# Patient Record
Sex: Female | Born: 1975 | Race: White | Hispanic: No | Smoking: Current every day smoker
Health system: Southern US, Community
[De-identification: ages and names within clinical notes are randomized; demographics above are authoritative.]

## PROBLEM LIST (undated history)

## (undated) DIAGNOSIS — T8859XA Other complications of anesthesia, initial encounter: Secondary | ICD-10-CM

## (undated) DIAGNOSIS — A159 Respiratory tuberculosis unspecified: Secondary | ICD-10-CM

## (undated) DIAGNOSIS — T4145XA Adverse effect of unspecified anesthetic, initial encounter: Secondary | ICD-10-CM

## (undated) DIAGNOSIS — Z5189 Encounter for other specified aftercare: Secondary | ICD-10-CM

## (undated) DIAGNOSIS — R569 Unspecified convulsions: Secondary | ICD-10-CM

## (undated) DIAGNOSIS — F419 Anxiety disorder, unspecified: Secondary | ICD-10-CM

## (undated) HISTORY — PX: OTHER SURGICAL HISTORY: SHX169

## (undated) HISTORY — PX: LEG SURGERY: SHX1003

---

## 2001-10-14 ENCOUNTER — Encounter (INDEPENDENT_AMBULATORY_CARE_PROVIDER_SITE_OTHER): Payer: Self-pay

## 2001-10-14 ENCOUNTER — Observation Stay (HOSPITAL_COMMUNITY): Admission: AD | Admit: 2001-10-14 | Discharge: 2001-10-15 | Payer: Self-pay | Admitting: *Deleted

## 2001-12-08 ENCOUNTER — Emergency Department (HOSPITAL_COMMUNITY): Admission: EM | Admit: 2001-12-08 | Discharge: 2001-12-08 | Payer: Self-pay | Admitting: Emergency Medicine

## 2003-12-04 ENCOUNTER — Emergency Department (HOSPITAL_COMMUNITY): Admission: EM | Admit: 2003-12-04 | Discharge: 2003-12-04 | Payer: Self-pay | Admitting: Emergency Medicine

## 2007-08-22 ENCOUNTER — Emergency Department (HOSPITAL_COMMUNITY): Admission: EM | Admit: 2007-08-22 | Discharge: 2007-08-22 | Payer: Self-pay | Admitting: *Deleted

## 2007-09-13 ENCOUNTER — Emergency Department (HOSPITAL_COMMUNITY): Admission: EM | Admit: 2007-09-13 | Discharge: 2007-09-13 | Payer: Self-pay | Admitting: Emergency Medicine

## 2008-04-16 ENCOUNTER — Emergency Department (HOSPITAL_COMMUNITY): Admission: EM | Admit: 2008-04-16 | Discharge: 2008-04-16 | Payer: Self-pay | Admitting: Emergency Medicine

## 2009-02-23 ENCOUNTER — Encounter (INDEPENDENT_AMBULATORY_CARE_PROVIDER_SITE_OTHER): Payer: Self-pay | Admitting: Cardiology

## 2009-02-23 ENCOUNTER — Ambulatory Visit (HOSPITAL_COMMUNITY): Admission: RE | Admit: 2009-02-23 | Discharge: 2009-02-23 | Payer: Self-pay | Admitting: Cardiology

## 2009-04-30 ENCOUNTER — Inpatient Hospital Stay (HOSPITAL_COMMUNITY): Admission: AD | Admit: 2009-04-30 | Discharge: 2009-05-01 | Payer: Self-pay | Admitting: Obstetrics and Gynecology

## 2009-10-29 HISTORY — PX: LIPOMA EXCISION: SHX5283

## 2010-04-12 ENCOUNTER — Ambulatory Visit (HOSPITAL_BASED_OUTPATIENT_CLINIC_OR_DEPARTMENT_OTHER): Admission: RE | Admit: 2010-04-12 | Discharge: 2010-04-12 | Payer: Self-pay | Admitting: General Surgery

## 2010-07-25 ENCOUNTER — Emergency Department (HOSPITAL_COMMUNITY): Admission: EM | Admit: 2010-07-25 | Discharge: 2010-07-25 | Payer: Self-pay | Admitting: Emergency Medicine

## 2010-10-29 HISTORY — PX: TUBAL LIGATION: SHX77

## 2010-12-14 LAB — HEPATITIS B SURFACE ANTIGEN: Hepatitis B Surface Ag: NEGATIVE

## 2010-12-14 LAB — ANTIBODY SCREEN: Antibody Screen: NEGATIVE

## 2010-12-14 LAB — ABO/RH: RH Type: POSITIVE

## 2010-12-14 LAB — RPR: RPR: NONREACTIVE

## 2011-01-14 LAB — POCT HEMOGLOBIN-HEMACUE: Hemoglobin: 14.1 g/dL (ref 12.0–15.0)

## 2011-01-15 LAB — CBC
HCT: 38.3 % (ref 36.0–46.0)
Hemoglobin: 13.4 g/dL (ref 12.0–15.0)
MCHC: 35 g/dL (ref 30.0–36.0)
MCV: 95.6 fL (ref 78.0–100.0)
Platelets: 226 10*3/uL (ref 150–400)
RDW: 12.6 % (ref 11.5–15.5)

## 2011-01-15 LAB — DIFFERENTIAL
Basophils Absolute: 0 10*3/uL (ref 0.0–0.1)
Eosinophils Absolute: 0.2 10*3/uL (ref 0.0–0.7)
Eosinophils Relative: 2 % (ref 0–5)
Lymphocytes Relative: 23 % (ref 12–46)
Monocytes Absolute: 0.4 10*3/uL (ref 0.1–1.0)

## 2011-01-15 LAB — PREGNANCY, URINE: Preg Test, Ur: NEGATIVE

## 2011-02-04 LAB — CBC
HCT: 34 % — ABNORMAL LOW (ref 36.0–46.0)
Hemoglobin: 11.9 g/dL — ABNORMAL LOW (ref 12.0–15.0)
MCHC: 35 g/dL (ref 30.0–36.0)
MCHC: 35.1 g/dL (ref 30.0–36.0)
MCV: 95.4 fL (ref 78.0–100.0)
Platelets: 274 10*3/uL (ref 150–400)
Platelets: 317 10*3/uL (ref 150–400)
RDW: 12.6 % (ref 11.5–15.5)
WBC: 13.8 10*3/uL — ABNORMAL HIGH (ref 4.0–10.5)

## 2011-02-05 ENCOUNTER — Inpatient Hospital Stay (HOSPITAL_COMMUNITY)
Admission: AD | Admit: 2011-02-05 | Discharge: 2011-02-05 | Disposition: A | Discharge status: Home / Self Care | Payer: Medicaid Other | Source: Ambulatory Visit | Attending: Obstetrics and Gynecology | Admitting: Obstetrics and Gynecology

## 2011-02-05 ENCOUNTER — Inpatient Hospital Stay (HOSPITAL_COMMUNITY): Payer: Medicaid Other

## 2011-02-05 DIAGNOSIS — O26879 Cervical shortening, unspecified trimester: Secondary | ICD-10-CM | POA: Insufficient documentation

## 2011-03-13 ENCOUNTER — Emergency Department (HOSPITAL_COMMUNITY)
Admission: EM | Admit: 2011-03-13 | Discharge: 2011-03-13 | Disposition: A | Discharge status: Home / Self Care | Payer: Medicaid Other | Attending: Emergency Medicine | Admitting: Emergency Medicine

## 2011-03-13 DIAGNOSIS — S2095XA Superficial foreign body of unspecified parts of thorax, initial encounter: Secondary | ICD-10-CM | POA: Insufficient documentation

## 2011-03-13 NOTE — H&P (Signed)
NAMEKAYE, LUOMA              ACCOUNT NO.:  [id number]   MEDICAL RECORD NO.:  [id number]          PATIENT TYPE:  INP   LOCATION:  9164                          FACILITY:  [facility]   PHYSICIAN:  Crist Fat. Rivard, M.D. DATE OF BIRTH:  1976/10/07   DATE OF ADMISSION:  04/30/2009  DATE OF DISCHARGE:                              HISTORY & PHYSICAL   Ms. Dukes is a 35 year old, gravida 5, para 2-0-1-2 at 39 weeks who  presented complaining of spontaneous rupture of membranes at  approximately 5:30 a.m. with clear fluid noted and uterine contractions  every 2-4 minutes.   The patient's pregnancy is remarkable for:  1. Group B strep negative.  2. PENICILLIN allergy.  3. History of cryo in 2000.  4. History of positive PPD but negative chest x-ray in October 2009.  5. History of right atrial enlargement but a negative cardiac workup      during the pregnancy.  6. The patient is a redhead.   PRENATAL LABORATORY DATA:  Blood type is O+, Rh antibody negative, VDRL  nonreactive, rubella titer positive, hepatitis B surface antigen  negative, HIV nonreactive.  GC and Chlamydia cultures were negative.  Pap was normal.  Hemoglobin upon entering the practice was 13.  It was  within normal limits at 28 weeks.  The patient had first trimester  screen that was normal.  I do not see AFP documented on her chart.  She  had a normal Glucola.  She had a negative group B strep, chlamydia and  GC at 35 weeks.   HISTORY OF PRESENT PREGNANCY:  The patient entered care at 13 weeks.  She did have first trimester screen that was normal.  She had been  evaluated at [facility] or [facility] for some chest pain in the  past.  She was referred to her cardiologist for this history secondary  to questionable right atrial enlargement.  She did have some type of  questionable ganglion versus a cyst on her right thigh.  She was  referred to general surgeon for that evaluation.  She had some headaches  at 19 weeks  for which she was recommend to use ibuprofen.  Her EDC of  May 07, 2009, was established by LMP and was in agreement with  ultrasound at first trimester screening.  She had another ultrasound at  19 weeks showing normal growth, anterior placenta, normal cervical  length and normal fluid and anatomy.  The general surgery consult  regarding her lump in the thigh was diagnostic for a lipoma.  Plan was  made to remove after delivery.  She missed her initial cardiology  appointment.  She was advised this was necessary.  She was seen by Dr.  Anne Fu on April 14.  She questions whether she may have had some  pericarditis but did not require any issues.  She then had a fairly  normal echocardiogram.  At that time, she was still smoking a little  bit, but she has subsequently stopped.  She had a normal Glucola.  She  originally had thought that she might want a tubal  but then declined  this as time went along.  At 36 weeks, her Group B strep and GC and  Chlamydia cultures were negative.  At 37 weeks, her cervix was 3, 50%  vertex, -2.  The rest of her pregnancy was essentially uncomplicated.   OBSTETRICAL HISTORY:  In 1997, she had a vaginal birth of a female  infant that weighed 8 pounds 10 ounces at 40 weeks.  She was in labor 12  hours.  She had epidural anesthesia that child was born in [place].  In 1999, she had a vaginal birth of a female infant that weighed 6  pounds 15 ounces at 40 weeks.  She was in labor 8 hours.  She had  epidural anesthesia, no complications.  That child was also born at [facility].  In 2001, she had a first trimester TAB.  In April 2009, she had  a first trimester miscarriage that required a dilatation and curettage.  She has had an abnormal Pap in the past and had cryosurgery in 2000.  She had a history of right atrial enlargement and was seen in the ER in  March 2009.  She had a negative cardiac workup.  She had a positive PPD.  In the past, she had a negative  chest x-ray in 2009.  She had some UTIs  during her previous pregnancies.  She was a previous smoker.  Her only  other hospitalizations were for childbirth and at 6 months, she was in  the hospital for Reye syndrome.   ALLERGIES:  The patient allergic to PENICILLIN which causes hives and  difficulty breathing.   FAMILY HISTORY:  Maternal grandfather had heart disease.  Paternal  grandfather had hypertension.  Mother and maternal grandmother have  varicosities.  Her daughter has asthma.  Paternal grandmother had  lymphocytic lymphoma.  Her mother and maternal grandmother are  schizophrenic. Her mother and other multiple family members are smokers.  Her mother and brother are drug and alcohol users.   SURGICAL HISTORY:  Includes cryosurgery in 2000, TAB in 2001, dilatation  and curettage in 2009.   SOCIAL HISTORY:  The patient is currently legally separated.  The father  of baby is involved and supportive; his name is Evangeline Dakin.  The  patient is currently in nursing school.  Her partner is employed in  [employer]; he has a Engineer, mining.  She has been followed by the  certified nurse midwife service at [facility].  She denies any  drug or alcohol use during this pregnancy.  She was a smoker in the  early part of pregnancy but has subsequently stopped.   IMPRESSION:  1. Intrauterine pregnancy at 39 weeks.  2. Spontaneous rupture of membranes in early labor.  3. Group B strep negative.   PLAN:  1. Admit to birthing suite for consult with Dr. Estanislado Pandy as attending      physician.  2. Routine certified nurse midwife orders.  3. Plan epidural then augmentation with Pitocin p.r.n.      Renaldo Reel Emilee Hero, C.N.M.      Crist Fat Rivard, M.D.  Electronically Signed    VLL/MEDQ  D:  04/30/2009  T:  04/30/2009  Job:  [id number]

## 2011-03-16 NOTE — Discharge Summary (Signed)
[facility]  Patient:    Miranda Wood, Miranda Wood Visit Number: [id number] MRN: [id number]          Service Type: DSU Location: 9300 [id number] Attending Physician:  Michaelle Copas Dictated by:   Arlis Porta, M.D. Admit Date:  10/14/2001 Discharge Date: 10/15/2001                             Discharge Summary  DISCHARGE DIAGNOSIS:          Status post dilation and evacuation for retained                               products from previous therapeutic abortion on                               September 19, 2001.  LABORATORY DATA:              CBC on October 14, 2001 showed a white blood cell count of 6.4, hemoglobin of 11.5, hematocrit of 33.1, platelet count of 258.  The patient is O positive blood type.  HOSPITAL COURSE:              The patient is a 35 year old white female, G3, P2-0-1-2 who presented with heavy vaginal bleeding about two hours prior to arrival.  Again, she is status post D&E on September 19, 2001.  Since then she has reported passing fist-size clots and pouring blood while sitting on the toilet.  This has made her feel weak and faint.  Of note, the patient is taking Yasmin birth control pills.                                On physical exam, the patient had a mild temperature of 100.4 with a blood pressure of 133/84 and a pulse of 87.  Her physical exam was nonfocal, specifically good breath sounds and normal heart sounds.  The patients cervix was noted to be with a closed os but with continuous passage of clots.  The uterus was noted to be slightly enlarged with fullness bilaterally.  The patients CBC was normal as above.                                The patient was then sent to the OR for repeat D&E for possibly retained products.  The patient tolerated the procedure well with an estimated blood loss of less than 100 cc of blood.  She was admitted for 23-hour observation overnight for which she remained well.  The  patient denied any further bleeding.  Her vital signs became afebrile and normal postoperatively.  She was treated empirically with cefotetan IV q.12h.  She was then discharged with a single dose of 500 mg Cipro as well as 1 g of azithromycin for GC and Chlamydia.  She was discharged with Methergine 0.2 mg p.o. q.6h. for two days as well.  Social worker was sent to the patients room for discussion of followup arrangements.  The patient was then discharged to home in good condition.  DISCHARGE MEDICATION:         Methergine 0.2 mg p.o. q.6h. x 2 days for bleeding.  ACTIVITY:  No restrictions.  DIET:                         No restrictions.  WOUND CARE:                   Watch for bleeding, increased pain or burning, and fever.  DISCHARGE INSTRUCTIONS:       She is to return to the hospital for any of the above symptoms.  FOLLOWUP:                     This will be arranged with the social worker at discharge.  If she has any questions, she is to feel free to come back to [facility]. Dictated by:   Arlis Porta, M.D. Attending Physician:  Michaelle Copas DD:  10/15/01 TD:  10/15/01 Job: [id number]

## 2011-03-16 NOTE — Op Note (Signed)
[facility]  Patient:    LORALI, KHAMIS Visit Number: [id number] MRN: [id number]          Service Type: DSU Location: 9300 [id number] Attending Physician:  Michaelle Copas Dictated by:   Conni Elliot, M.D. Proc. Date: 10/14/01 Admit Date:  10/14/2001                             Operative Report  PREOPERATIVE DIAGNOSIS:       Hemorrhage probably secondary to retained products of conception.  POSTOPERATIVE DIAGNOSIS:      Hemorrhage probably secondary to retained products of conception.  OPERATION:                    Dilatation and evacuation.  SURGEON:                      Conni Elliot, M.D.  ANESTHESIA:                   MAC.  FINDINGS:                     Uterus was upper limits of normal size, anterior.  The cervix was already dilated and easily accepted with gradual progression but really minimal dilatation up to a #30 dilator. A #10 suction curet was utilized.  DESCRIPTION OF PROCEDURE:     With patient under MAC anesthesia, the patient supine in the lithotomy position, perineum and vagina were prepped with Betadine solution.  Bladder was emptied and weighted speculum was placed in the posterior vagina.  Anterior cervix was grasped.  As stated, the cervix was already dilated to easily accept all the way up to #30 dilator.  A #10 suction curet was utilized.  The completeness of the procedure was identified by sharp curettage.  Estimated blood loss was less than 100 cc.  There was apparent significant amount of either retained products or possibly a blood clot, but appeared to be retained products. Dictated by:   Conni Elliot, M.D. Attending Physician:  Michaelle Copas DD:  10/14/01 TD:  10/15/01 Job: [id number]

## 2011-05-28 ENCOUNTER — Encounter (HOSPITAL_COMMUNITY): Payer: Self-pay | Admitting: *Deleted

## 2011-05-28 ENCOUNTER — Inpatient Hospital Stay (HOSPITAL_COMMUNITY)
Admission: AD | Admit: 2011-05-28 | Discharge: 2011-05-28 | Disposition: A | Discharge status: Home / Self Care | Payer: Medicaid Other | Source: Ambulatory Visit | Attending: Obstetrics and Gynecology | Admitting: Obstetrics and Gynecology

## 2011-05-28 DIAGNOSIS — R350 Frequency of micturition: Secondary | ICD-10-CM | POA: Insufficient documentation

## 2011-05-28 DIAGNOSIS — O99330 Smoking (tobacco) complicating pregnancy, unspecified trimester: Secondary | ICD-10-CM | POA: Insufficient documentation

## 2011-05-28 DIAGNOSIS — O30039 Twin pregnancy, monochorionic/diamniotic, unspecified trimester: Secondary | ICD-10-CM | POA: Insufficient documentation

## 2011-05-28 DIAGNOSIS — N949 Unspecified condition associated with female genital organs and menstrual cycle: Secondary | ICD-10-CM | POA: Insufficient documentation

## 2011-05-28 DIAGNOSIS — O3430 Maternal care for cervical incompetence, unspecified trimester: Secondary | ICD-10-CM | POA: Insufficient documentation

## 2011-05-28 DIAGNOSIS — O9989 Other specified diseases and conditions complicating pregnancy, childbirth and the puerperium: Secondary | ICD-10-CM | POA: Insufficient documentation

## 2011-05-28 DIAGNOSIS — O30009 Twin pregnancy, unspecified number of placenta and unspecified number of amniotic sacs, unspecified trimester: Secondary | ICD-10-CM | POA: Insufficient documentation

## 2011-05-28 LAB — URINALYSIS, ROUTINE W REFLEX MICROSCOPIC
Bilirubin Urine: NEGATIVE
Glucose, UA: NEGATIVE mg/dL
Hgb urine dipstick: NEGATIVE
Specific Gravity, Urine: 1.015 (ref 1.005–1.030)

## 2011-05-28 MED ORDER — ACETAMINOPHEN 325 MG PO TABS
650.0000 mg | ORAL_TABLET | Freq: Four times a day (QID) | ORAL | Status: DC | PRN
  Administered 2011-05-28: 650 mg via ORAL

## 2011-05-28 MED ORDER — BETAMETHASONE SOD PHOS & ACET 6 (3-3) MG/ML IJ SUSP
12.5000 mg | Freq: Once | INTRAMUSCULAR | Status: AC
  Administered 2011-05-28: 12.5 mg via INTRAMUSCULAR
  Filled 2011-05-28: qty 2.1

## 2011-05-28 NOTE — Progress Notes (Signed)
H STEELMAN CNM AT BEDSIDE  

## 2011-05-28 NOTE — Progress Notes (Signed)
WATER PITCHER, SPRITE AND GRAHAM CRACKERS OFFERED. [place] SANDWICH ORDERED.

## 2011-05-28 NOTE — Progress Notes (Signed)
D/C CARDIO PER H STEELMAN CNM 2ND REVIEWER OF STRIP

## 2011-05-28 NOTE — ED Provider Notes (Signed)
History   Pt is 35y.o. WF [id number] at 32.4 weeks who was sent from office for PTL eval secondary to c/o increasing pelvic pressure recently & cx 2cm/50% at office per Dr. Normand Sloop. No FFN or cx's done at office.  Pt reports good fetal movement x2.  At office for routine visit & had scheduled u/s & both fetuses weighing around 4.5 lbs.  A-Vertex. B-Breech. Pt not working, but s.o. States in MAU that she is still very active and "hard to get her to sit down."  Denies VB, LOF, abnl d/c.  No FFN done in this pregnancy per pt recall. Pt hasn't been to hospital since around 16 weeks when came for u/s for cx length.   Pregnancy r/f:  1.  Twins:  Mono/di   2.  Cryo 2000   3.  PCN allergy--breathing difficulty   4.  Rt atrial enlargement   5.  Unk LMP   6.  Previously seen LVEIF seen on one fetus this preg & unsure if persists..today's u/s not available  Chief Complaint  Patient presents with  . Laboring   HPI  OB History    Grav Para Term Preterm Abortions TAB SAB Ect Mult Living   6 3 3  2  2   3       No past medical history on file.  Past Surgical History  Procedure Date  . Leg surgery     No family history on file.  History  Substance Use Topics  . Smoking status: Current Everyday Smoker -- 1.0 packs/day  . Smokeless tobacco: Not on file  . Alcohol Use: No    Allergies:  Allergies  Allergen Reactions  . Penicillins     Prescriptions prior to admission  Medication Sig Dispense Refill  . acetaminophen (TYLENOL) 500 MG tablet Take 1,000 mg by mouth every 6 (six) hours as needed.        . prenatal vitamin w/FE, FA (PRENATAL 1 + 1) 27-1 MG TABS Take 1 tablet by mouth daily.          Review of Systems  Constitutional: Negative.   Respiratory: Negative.   Gastrointestinal: Negative.   Genitourinary: Positive for frequency.  Neurological: Negative.  Negative for dizziness and headaches.   Physical Exam   Blood pressure 121/73, pulse 96, temperature 98.6 F (37 C),  temperature source Oral, resp. rate 20, height 5\' 8"  (1.727 m), weight 87.998 kg (194 lb).  Physical Exam  Constitutional: She is oriented to person, place, and time. She appears well-developed and well-nourished.  Cardiovascular: Normal rate and regular rhythm.   Respiratory: Effort normal.  GI: Soft.       gravid  Genitourinary:       Cx:  2.5/50/-2, anterior, medium consistency. Spec exam deferred.  Deferred GBS, Gc/Ct & wet prep as well  Neurological: She is alert and oriented to person, place, and time.  Skin: Skin is warm and dry.  Psychiatric: She has a normal mood and affect. Her behavior is normal. Judgment and thought content normal.   Labs:  U/a WNL--SG =1.015  EFM:  "A"--140, reactive, moderate variability, no decels  "B"--150, reactive, moderate variability, no decels TOCO:  occ'l ctx w/ intermittent UI  MAU Course  Procedures 1.  U/a 2.  3 hrs ext monitoring 3.  Recheck of cx exam after monitoring for labor 4.  Urine sent for culture, despite u/a WNL 5.  1st dose of Betamethasone IM at 1315  Assessment and Plan  1.  Twins  at 32.4 weeks 2.  Preterm cervical dilatation 3.  No further cervical change after 3 hrs of monitoring 4.  1st dose BMZ at 1315 5.  H/o cryo & multip  1.  Per c/w dr. Stefano Gaul, d/c home on Level 3 bedrest and PTL precautions.  Pt to return to MAU for 2nd dose of BMZ tomorrow 05/29/11.  Injection due at 1315, but instructed pt to arrive around 1245. 2.  Continue tylenol & benadryl prn pain, insomnia. 3.  Pelvic rest. 4.  Also has scheduled f/u at [facility] 06/04/11.  F/u prn 5.  FFN & cx's prn.  Cannot perform FFN until after 1500 05/29/11 is needed.   Ronell Duffus H 05/28/2011, 3:23 PM

## 2011-05-28 NOTE — Progress Notes (Signed)
H STEELMAN CNM AT BEDSIDE

## 2011-05-28 NOTE — Progress Notes (Signed)
Sent from office, 2 cm dilated.  Feeling pressure, unsure of contractions

## 2011-05-29 ENCOUNTER — Inpatient Hospital Stay (HOSPITAL_COMMUNITY)
Admission: AD | Admit: 2011-05-29 | Discharge: 2011-05-29 | Disposition: A | Discharge status: Home / Self Care | Payer: Medicaid Other | Source: Ambulatory Visit | Attending: Obstetrics and Gynecology | Admitting: Obstetrics and Gynecology

## 2011-05-29 DIAGNOSIS — O4700 False labor before 37 completed weeks of gestation, unspecified trimester: Secondary | ICD-10-CM | POA: Insufficient documentation

## 2011-05-29 LAB — URINE CULTURE

## 2011-05-29 MED ORDER — BETAMETHASONE SOD PHOS & ACET 6 (3-3) MG/ML IJ SUSP
12.0000 mg | Freq: Once | INTRAMUSCULAR | Status: AC
  Administered 2011-05-29: 12 mg via INTRAMUSCULAR
  Filled 2011-05-29: qty 2

## 2011-06-01 NOTE — ED Provider Notes (Signed)
Pt presents to MAU 05/29/11 for Second Betamethasone injection for fetal pulmonary preparation in case of preterm delivery in the setting of preterm cervical dilation. See ED Provider note from 05/28/11 by Larna Daughters for details. Miranda Forth, MD

## 2011-06-06 ENCOUNTER — Other Ambulatory Visit: Payer: Self-pay | Admitting: Obstetrics and Gynecology

## 2011-07-04 ENCOUNTER — Encounter (HOSPITAL_COMMUNITY): Payer: Self-pay

## 2011-07-04 ENCOUNTER — Encounter (HOSPITAL_COMMUNITY)
Admission: RE | Admit: 2011-07-04 | Discharge: 2011-07-04 | Disposition: A | Discharge status: Home / Self Care | Payer: Medicaid Other | Source: Ambulatory Visit | Attending: Obstetrics and Gynecology | Admitting: Obstetrics and Gynecology

## 2011-07-04 HISTORY — DX: Adverse effect of unspecified anesthetic, initial encounter: T41.45XA

## 2011-07-04 HISTORY — DX: Other complications of anesthesia, initial encounter: T88.59XA

## 2011-07-04 LAB — CBC
MCH: 32.7 pg (ref 26.0–34.0)
MCV: 94.1 fL (ref 78.0–100.0)
Platelets: 207 10*3/uL (ref 150–400)
RDW: 14 % (ref 11.5–15.5)
WBC: 10.9 10*3/uL — ABNORMAL HIGH (ref 4.0–10.5)

## 2011-07-04 NOTE — Patient Instructions (Addendum)
20 Versie Soave Dukes  07/04/2011   Your procedure is scheduled on:  07/09/11  Enter through the Main Entrance of [facility] at 9 AM.  Pick up the phone at the desk and dial [phone].   Call this number if you have problems the morning of surgery: [phone]   Remember:   Do not eat food:After Midnight.  Do not drink clear liquids: After Midnight.  Take these medicines the morning of surgery with A SIP OF WATER: NA   Do not wear jewelry, make-up or nail polish.  Do not wear lotions, powders, or perfumes. You may wear deodorant.  Do not shave 48 hours prior to surgery.  Do not bring valuables to the hospital.  Contacts, dentures or bridgework may not be worn into surgery.  Leave suitcase in the car. After surgery it may be brought to your room.  For patients admitted to the hospital, checkout time is 11:00 AM the day of discharge.   Patients discharged the day of surgery will not be allowed to drive home.  Name and phone number of your driver: NA  Special Instructions: CHG Shower Use Special Wash: 1/2 bottle night before surgery and 1/2 bottle morning of surgery.   Please read over the following fact sheets that you were given: MRSA Information

## 2011-07-08 MED ORDER — CLINDAMYCIN PHOSPHATE 900 MG/50ML IV SOLN
900.0000 mg | INTRAVENOUS | Status: DC
  Filled 2011-07-08: qty 50

## 2011-07-08 MED ORDER — GENTAMICIN IN SALINE 1-0.9 MG/ML-% IV SOLN
100.0000 mg | INTRAVENOUS | Status: DC
  Filled 2011-07-08: qty 100

## 2011-07-09 ENCOUNTER — Encounter (HOSPITAL_COMMUNITY)
Admission: RE | Disposition: A | Discharge status: Home / Self Care | Payer: Self-pay | Source: Ambulatory Visit | Attending: Obstetrics and Gynecology

## 2011-07-09 ENCOUNTER — Other Ambulatory Visit: Payer: Self-pay | Admitting: Obstetrics and Gynecology

## 2011-07-09 ENCOUNTER — Encounter (HOSPITAL_COMMUNITY): Payer: Self-pay | Admitting: *Deleted

## 2011-07-09 ENCOUNTER — Inpatient Hospital Stay (HOSPITAL_COMMUNITY): Payer: Medicaid Other | Admitting: Anesthesiology

## 2011-07-09 ENCOUNTER — Encounter (HOSPITAL_COMMUNITY): Payer: Self-pay | Admitting: Anesthesiology

## 2011-07-09 ENCOUNTER — Inpatient Hospital Stay (HOSPITAL_COMMUNITY)
Admission: RE | Admit: 2011-07-09 | Discharge: 2011-07-12 | DRG: 766 | Disposition: A | Discharge status: Home / Self Care | Payer: Medicaid Other | Source: Ambulatory Visit | Attending: Obstetrics and Gynecology | Admitting: Obstetrics and Gynecology

## 2011-07-09 DIAGNOSIS — O30039 Twin pregnancy, monochorionic/diamniotic, unspecified trimester: Secondary | ICD-10-CM

## 2011-07-09 DIAGNOSIS — Z302 Encounter for sterilization: Secondary | ICD-10-CM

## 2011-07-09 DIAGNOSIS — O30009 Twin pregnancy, unspecified number of placenta and unspecified number of amniotic sacs, unspecified trimester: Secondary | ICD-10-CM | POA: Diagnosis present

## 2011-07-09 DIAGNOSIS — O3090 Multiple gestation, unspecified, unspecified trimester: Principal | ICD-10-CM | POA: Diagnosis present

## 2011-07-09 SURGERY — Surgical Case
Anesthesia: Spinal | Laterality: Bilateral | Wound class: Clean Contaminated

## 2011-07-09 MED ORDER — WITCH HAZEL-GLYCERIN EX PADS: 1.0000 "application " | MEDICATED_PAD | CUTANEOUS | Status: DC | PRN

## 2011-07-09 MED ORDER — ONDANSETRON HCL 4 MG PO TABS: 4.0000 mg | ORAL_TABLET | ORAL | Status: DC | PRN

## 2011-07-09 MED ORDER — KETOROLAC TROMETHAMINE 60 MG/2ML IM SOLN
60.0000 mg | Freq: Once | INTRAMUSCULAR | Status: AC | PRN
  Administered 2011-07-09: 60 mg via INTRAMUSCULAR

## 2011-07-09 MED ORDER — FENTANYL CITRATE 0.05 MG/ML IJ SOLN
INTRAMUSCULAR | Status: DC | PRN
  Administered 2011-07-09: 15 ug via INTRATHECAL
  Administered 2011-07-09: 25 ug via INTRAVENOUS

## 2011-07-09 MED ORDER — OXYTOCIN 20 UNITS IN LACTATED RINGERS INFUSION - SIMPLE
125.0000 mL/h | INTRAVENOUS | Status: DC
  Administered 2011-07-09: 125 mL/h via INTRAVENOUS

## 2011-07-09 MED ORDER — DIPHENHYDRAMINE HCL 50 MG/ML IJ SOLN: 25.0000 mg | INTRAMUSCULAR | Status: DC | PRN

## 2011-07-09 MED ORDER — LACTATED RINGERS IV SOLN
Freq: Once | INTRAVENOUS | Status: AC
  Administered 2011-07-09: 10:00:00 via INTRAVENOUS

## 2011-07-09 MED ORDER — METHYLERGONOVINE MALEATE 0.2 MG/ML IJ SOLN
0.2000 mg | Freq: Once | INTRAMUSCULAR | Status: AC
  Administered 2011-07-09: 0.2 mg via INTRAMUSCULAR
  Filled 2011-07-09: qty 1

## 2011-07-09 MED ORDER — SODIUM CHLORIDE 0.9 % IJ SOLN: 3.0000 mL | INTRAMUSCULAR | Status: DC | PRN

## 2011-07-09 MED ORDER — SERTRALINE HCL 25 MG PO TABS
25.0000 mg | ORAL_TABLET | Freq: Every day | ORAL | Status: DC
  Administered 2011-07-10: 11:00:00 via ORAL
  Administered 2011-07-11 – 2011-07-12 (×2): 25 mg via ORAL
  Filled 2011-07-09 (×4): qty 1

## 2011-07-09 MED ORDER — MEASLES, MUMPS & RUBELLA VAC ~~LOC~~ INJ: 0.5000 mL | INJECTION | SUBCUTANEOUS | Status: DC | PRN

## 2011-07-09 MED ORDER — IBUPROFEN 600 MG PO TABS
600.0000 mg | ORAL_TABLET | Freq: Four times a day (QID) | ORAL | Status: DC
  Administered 2011-07-10 – 2011-07-12 (×10): 600 mg via ORAL
  Filled 2011-07-09 (×4): qty 1

## 2011-07-09 MED ORDER — TETANUS-DIPHTH-ACELL PERTUSSIS 5-2.5-18.5 LF-MCG/0.5 IM SUSP
0.5000 mL | Freq: Once | INTRAMUSCULAR | Status: AC
  Administered 2011-07-10: 0.5 mL via INTRAMUSCULAR
  Filled 2011-07-09: qty 0.5

## 2011-07-09 MED ORDER — ZOLPIDEM TARTRATE 5 MG PO TABS: 5.0000 mg | ORAL_TABLET | Freq: Every evening | ORAL | Status: DC | PRN

## 2011-07-09 MED ORDER — ONDANSETRON HCL 4 MG/2ML IJ SOLN
INTRAMUSCULAR | Status: AC
  Filled 2011-07-09: qty 2

## 2011-07-09 MED ORDER — OXYCODONE-ACETAMINOPHEN 5-325 MG PO TABS
1.0000 | ORAL_TABLET | ORAL | Status: DC | PRN
  Administered 2011-07-09: 1 via ORAL
  Administered 2011-07-10: 2 via ORAL
  Administered 2011-07-10: 1 via ORAL
  Administered 2011-07-10 (×2): 2 via ORAL
  Administered 2011-07-10 – 2011-07-11 (×2): 1 via ORAL
  Administered 2011-07-11 – 2011-07-12 (×4): 2 via ORAL
  Filled 2011-07-09 (×2): qty 2
  Filled 2011-07-09 (×2): qty 1
  Filled 2011-07-09: qty 2
  Filled 2011-07-09 (×2): qty 1
  Filled 2011-07-09 (×3): qty 2
  Filled 2011-07-09 (×2): qty 1

## 2011-07-09 MED ORDER — KETOROLAC TROMETHAMINE 60 MG/2ML IM SOLN
INTRAMUSCULAR | Status: AC
  Administered 2011-07-09: 60 mg via INTRAMUSCULAR
  Filled 2011-07-09: qty 2

## 2011-07-09 MED ORDER — OXYTOCIN 10 UNIT/ML IJ SOLN
INTRAMUSCULAR | Status: AC
  Filled 2011-07-09: qty 4

## 2011-07-09 MED ORDER — SCOPOLAMINE 1 MG/3DAYS TD PT72
MEDICATED_PATCH | TRANSDERMAL | Status: AC
  Administered 2011-07-09: 1.5 mg
  Filled 2011-07-09: qty 1

## 2011-07-09 MED ORDER — PHENYLEPHRINE 40 MCG/ML (10ML) SYRINGE FOR IV PUSH (FOR BLOOD PRESSURE SUPPORT)
PREFILLED_SYRINGE | INTRAVENOUS | Status: AC
  Filled 2011-07-09: qty 15

## 2011-07-09 MED ORDER — LACTATED RINGERS IV SOLN
INTRAVENOUS | Status: DC
  Administered 2011-07-09: 22:00:00 via INTRAVENOUS

## 2011-07-09 MED ORDER — LANOLIN HYDROUS EX OINT: 1.0000 "application " | TOPICAL_OINTMENT | CUTANEOUS | Status: DC | PRN

## 2011-07-09 MED ORDER — MIDAZOLAM HCL 5 MG/5ML IJ SOLN
INTRAMUSCULAR | Status: DC | PRN
  Administered 2011-07-09: 1 mg via INTRAVENOUS

## 2011-07-09 MED ORDER — DIBUCAINE 1 % RE OINT: 1.0000 "application " | TOPICAL_OINTMENT | RECTAL | Status: DC | PRN

## 2011-07-09 MED ORDER — SIMETHICONE 80 MG PO CHEW
80.0000 mg | CHEWABLE_TABLET | Freq: Three times a day (TID) | ORAL | Status: DC
  Administered 2011-07-09 – 2011-07-11 (×7): 80 mg via ORAL

## 2011-07-09 MED ORDER — MEPERIDINE HCL 25 MG/ML IJ SOLN: 6.2500 mg | INTRAMUSCULAR | Status: DC | PRN

## 2011-07-09 MED ORDER — ONDANSETRON HCL 4 MG/2ML IJ SOLN: 4.0000 mg | INTRAMUSCULAR | Status: DC | PRN

## 2011-07-09 MED ORDER — MENTHOL 3 MG MT LOZG: 1.0000 | LOZENGE | OROMUCOSAL | Status: DC | PRN

## 2011-07-09 MED ORDER — HYDROMORPHONE HCL 1 MG/ML IJ SOLN: 0.2500 mg | INTRAMUSCULAR | Status: DC | PRN

## 2011-07-09 MED ORDER — KETOROLAC TROMETHAMINE 60 MG/2ML IM SOLN
INTRAMUSCULAR | Status: AC
  Filled 2011-07-09: qty 2

## 2011-07-09 MED ORDER — NALBUPHINE SYRINGE 5 MG/0.5 ML
5.0000 mg | INJECTION | INTRAMUSCULAR | Status: DC | PRN
  Administered 2011-07-09: 10 mg via INTRAVENOUS
  Filled 2011-07-09: qty 1

## 2011-07-09 MED ORDER — DIPHENHYDRAMINE HCL 50 MG/ML IJ SOLN: 12.5000 mg | INTRAMUSCULAR | Status: DC | PRN

## 2011-07-09 MED ORDER — EPHEDRINE 5 MG/ML INJ
INTRAVENOUS | Status: AC
  Filled 2011-07-09: qty 10

## 2011-07-09 MED ORDER — MIDAZOLAM HCL 2 MG/2ML IJ SOLN
INTRAMUSCULAR | Status: AC
  Filled 2011-07-09: qty 2

## 2011-07-09 MED ORDER — OXYTOCIN 20 UNITS IN LACTATED RINGERS INFUSION - SIMPLE
INTRAVENOUS | Status: AC
  Administered 2011-07-09: 125 mL/h via INTRAVENOUS
  Filled 2011-07-09: qty 1000

## 2011-07-09 MED ORDER — ONDANSETRON HCL 4 MG/2ML IJ SOLN
INTRAMUSCULAR | Status: DC | PRN
  Administered 2011-07-09: 4 mg via INTRAVENOUS

## 2011-07-09 MED ORDER — DIPHENHYDRAMINE HCL 25 MG PO CAPS
25.0000 mg | ORAL_CAPSULE | Freq: Four times a day (QID) | ORAL | Status: DC | PRN
  Administered 2011-07-10: 25 mg via ORAL

## 2011-07-09 MED ORDER — LACTATED RINGERS IV SOLN
INTRAVENOUS | Status: DC
  Administered 2011-07-09 (×5): via INTRAVENOUS

## 2011-07-09 MED ORDER — GENTAMICIN SULFATE 40 MG/ML IJ SOLN
INTRAVENOUS | Status: DC | PRN
  Administered 2011-07-09: 150 mL via INTRAVENOUS

## 2011-07-09 MED ORDER — IBUPROFEN 600 MG PO TABS
600.0000 mg | ORAL_TABLET | Freq: Four times a day (QID) | ORAL | Status: DC | PRN
  Filled 2011-07-09 (×7): qty 1

## 2011-07-09 MED ORDER — FENTANYL CITRATE 0.05 MG/ML IJ SOLN
INTRAMUSCULAR | Status: AC
  Filled 2011-07-09: qty 2

## 2011-07-09 MED ORDER — METHYLERGONOVINE MALEATE 0.2 MG PO TABS
0.2000 mg | ORAL_TABLET | Freq: Four times a day (QID) | ORAL | Status: AC
  Administered 2011-07-09 – 2011-07-10 (×5): 0.2 mg via ORAL
  Filled 2011-07-09 (×5): qty 1

## 2011-07-09 MED ORDER — BUPIVACAINE-EPINEPHRINE 0.5% -1:200000 IJ SOLN
INTRAMUSCULAR | Status: DC | PRN
  Administered 2011-07-09: 10 mL

## 2011-07-09 MED ORDER — METOCLOPRAMIDE HCL 5 MG/ML IJ SOLN: 10.0000 mg | Freq: Three times a day (TID) | INTRAMUSCULAR | Status: DC | PRN

## 2011-07-09 MED ORDER — ACETAMINOPHEN 500 MG PO TABS: 1000.0000 mg | ORAL_TABLET | Freq: Four times a day (QID) | ORAL | Status: DC | PRN

## 2011-07-09 MED ORDER — PRENATAL PLUS 27-1 MG PO TABS
1.0000 | ORAL_TABLET | Freq: Every day | ORAL | Status: DC
  Administered 2011-07-10 – 2011-07-12 (×3): 1 via ORAL
  Filled 2011-07-09 (×3): qty 1

## 2011-07-09 MED ORDER — OXYTOCIN 20 UNITS IN LACTATED RINGERS INFUSION - SIMPLE
INTRAVENOUS | Status: DC | PRN
  Administered 2011-07-09: 20 [IU] via INTRAVENOUS

## 2011-07-09 MED ORDER — KETOROLAC TROMETHAMINE 30 MG/ML IJ SOLN
30.0000 mg | Freq: Four times a day (QID) | INTRAMUSCULAR | Status: AC | PRN
  Administered 2011-07-09: 30 mg via INTRAVENOUS
  Filled 2011-07-09: qty 1

## 2011-07-09 MED ORDER — DIPHENHYDRAMINE HCL 25 MG PO CAPS
25.0000 mg | ORAL_CAPSULE | ORAL | Status: DC | PRN
  Administered 2011-07-10: 25 mg via ORAL
  Filled 2011-07-09 (×2): qty 1

## 2011-07-09 MED ORDER — MEDROXYPROGESTERONE ACETATE 150 MG/ML IM SUSP: 150.0000 mg | INTRAMUSCULAR | Status: DC | PRN

## 2011-07-09 MED ORDER — PRENATAL PLUS 27-1 MG PO TABS: 1.0000 | ORAL_TABLET | Freq: Every day | ORAL | Status: DC

## 2011-07-09 MED ORDER — SENNOSIDES-DOCUSATE SODIUM 8.6-50 MG PO TABS
2.0000 | ORAL_TABLET | Freq: Every day | ORAL | Status: DC
  Administered 2011-07-09 – 2011-07-11 (×3): 2 via ORAL

## 2011-07-09 MED ORDER — NALBUPHINE SYRINGE 5 MG/0.5 ML: 5.0000 mg | INJECTION | INTRAMUSCULAR | Status: DC | PRN

## 2011-07-09 MED ORDER — CEFAZOLIN SODIUM 1-5 GM-% IV SOLN
INTRAVENOUS | Status: AC
  Filled 2011-07-09: qty 50

## 2011-07-09 MED ORDER — KETOROLAC TROMETHAMINE 30 MG/ML IJ SOLN: 30.0000 mg | Freq: Four times a day (QID) | INTRAMUSCULAR | Status: AC | PRN

## 2011-07-09 MED ORDER — NALOXONE HCL 0.4 MG/ML IJ SOLN: 0.4000 mg | INTRAMUSCULAR | Status: DC | PRN

## 2011-07-09 MED ORDER — ONDANSETRON HCL 4 MG/2ML IJ SOLN: 4.0000 mg | Freq: Three times a day (TID) | INTRAMUSCULAR | Status: DC | PRN

## 2011-07-09 MED ORDER — SCOPOLAMINE 1 MG/3DAYS TD PT72: 1.0000 | MEDICATED_PATCH | Freq: Once | TRANSDERMAL | Status: DC

## 2011-07-09 MED ORDER — BUPIVACAINE IN DEXTROSE 0.75-8.25 % IT SOLN
INTRATHECAL | Status: DC | PRN
  Administered 2011-07-09: 1.5 mg via INTRATHECAL

## 2011-07-09 MED ORDER — EPHEDRINE SULFATE 50 MG/ML IJ SOLN
INTRAMUSCULAR | Status: DC | PRN
  Administered 2011-07-09 (×10): 5 mg via INTRAVENOUS

## 2011-07-09 MED ORDER — PHENYLEPHRINE HCL 10 MG/ML IJ SOLN
INTRAMUSCULAR | Status: DC | PRN
  Administered 2011-07-09: 40 ug via INTRAVENOUS
  Administered 2011-07-09: 80 ug via INTRAVENOUS
  Administered 2011-07-09: 40 ug via INTRAVENOUS
  Administered 2011-07-09 (×2): 80 ug via INTRAVENOUS
  Administered 2011-07-09: 40 ug via INTRAVENOUS
  Administered 2011-07-09: 80 ug via INTRAVENOUS
  Administered 2011-07-09: 40 ug via INTRAVENOUS
  Administered 2011-07-09 (×2): 80 ug via INTRAVENOUS
  Administered 2011-07-09 (×2): 40 ug via INTRAVENOUS

## 2011-07-09 MED ORDER — SIMETHICONE 80 MG PO CHEW: 80.0000 mg | CHEWABLE_TABLET | ORAL | Status: DC | PRN

## 2011-07-09 MED ORDER — SODIUM CHLORIDE 0.9 % IV SOLN
1.0000 ug/kg/h | INTRAVENOUS | Status: DC | PRN
  Filled 2011-07-09: qty 2.5

## 2011-07-09 MED ORDER — MORPHINE SULFATE 0.5 MG/ML IJ SOLN
INTRAMUSCULAR | Status: AC
  Filled 2011-07-09: qty 10

## 2011-07-09 MED ORDER — MORPHINE SULFATE (PF) 0.5 MG/ML IJ SOLN
INTRAMUSCULAR | Status: DC | PRN
  Administered 2011-07-09: .1 mg via INTRATHECAL

## 2011-07-09 SURGICAL SUPPLY — 39 items
CHLORAPREP W/TINT 26ML (MISCELLANEOUS) ×2 IMPLANT
CLOTH BEACON ORANGE TIMEOUT ST (SAFETY) ×2 IMPLANT
CONTAINER PREFILL 10% NBF 15ML (MISCELLANEOUS) IMPLANT
DRAIN JACKSON PRT FLT 7MM (DRAIN) IMPLANT
DRESSING TELFA 8X3 (GAUZE/BANDAGES/DRESSINGS) ×2 IMPLANT
DRSG PAD ABDOMINAL 8X10 ST (GAUZE/BANDAGES/DRESSINGS) ×2 IMPLANT
ELECT REM PT RETURN 9FT ADLT (ELECTROSURGICAL) ×2
ELECTRODE REM PT RTRN 9FT ADLT (ELECTROSURGICAL) ×1 IMPLANT
EVACUATOR SILICONE 100CC (DRAIN) IMPLANT
EXTRACTOR VACUUM M CUP 4 TUBE (SUCTIONS) IMPLANT
GAUZE SPONGE 4X4 12PLY STRL LF (GAUZE/BANDAGES/DRESSINGS) ×4 IMPLANT
GLOVE BIOGEL PI IND STRL 8.5 (GLOVE) ×1 IMPLANT
GLOVE BIOGEL PI INDICATOR 8.5 (GLOVE) ×1
GLOVE ECLIPSE 8.0 STRL XLNG CF (GLOVE) ×4 IMPLANT
GOWN PREVENTION PLUS LG XLONG (DISPOSABLE) ×4 IMPLANT
GOWN PREVENTION PLUS XXLARGE (GOWN DISPOSABLE) ×2 IMPLANT
KIT ABG SYR 3ML LUER SLIP (SYRINGE) IMPLANT
NEEDLE HYPO 25X1 1.5 SAFETY (NEEDLE) ×2 IMPLANT
NEEDLE HYPO 25X5/8 SAFETYGLIDE (NEEDLE) IMPLANT
PACK C SECTION WH (CUSTOM PROCEDURE TRAY) ×2 IMPLANT
PAD ABD 7.5X8 STRL (GAUZE/BANDAGES/DRESSINGS) ×2 IMPLANT
RINGERS IRRIG 1000ML POUR BTL (IV SOLUTION) ×2 IMPLANT
SLEEVE SCD COMPRESS KNEE MED (MISCELLANEOUS) IMPLANT
STAPLER VISISTAT 35W (STAPLE) IMPLANT
SUT MNCRL AB 3-0 PS2 27 (SUTURE) IMPLANT
SUT PLAIN 0 NONE (SUTURE) IMPLANT
SUT SILK 3 0 FS 1X18 (SUTURE) IMPLANT
SUT VIC AB 0 CT1 27 (SUTURE) ×2
SUT VIC AB 0 CT1 27XBRD ANBCTR (SUTURE) ×2 IMPLANT
SUT VIC AB 2-0 CTX 36 (SUTURE) ×4 IMPLANT
SUT VIC AB 3-0 CT1 27 (SUTURE)
SUT VIC AB 3-0 CT1 TAPERPNT 27 (SUTURE) IMPLANT
SUT VIC AB 3-0 SH 27 (SUTURE)
SUT VIC AB 3-0 SH 27X BRD (SUTURE) IMPLANT
SYR CONTROL 10ML LL (SYRINGE) ×2 IMPLANT
TAPE CLOTH SURG 4X10 WHT LF (GAUZE/BANDAGES/DRESSINGS) ×2 IMPLANT
TOWEL OR 17X24 6PK STRL BLUE (TOWEL DISPOSABLE) ×4 IMPLANT
TRAY FOLEY CATH 14FR (SET/KITS/TRAYS/PACK) ×2 IMPLANT
WATER STERILE IRR 1000ML POUR (IV SOLUTION) ×2 IMPLANT

## 2011-07-09 NOTE — Anesthesia Preprocedure Evaluation (Signed)
Anesthesia Evaluation  Name, MR# and DOB Patient awake  General Assessment Comment  Reviewed: Allergy & Precautions, H&P , NPO status , Patient's Chart, lab work & pertinent test results, reviewed documented beta blocker date and time   Airway Mallampati: II TM Distance: >3 FB Neck ROM: full    Dental  (+) Teeth Intact   Pulmonary  clear to auscultation  breath sounds clear to auscultation none    Cardiovascular regular Normal    Neuro/Psych Negative Neurological ROS  Negative Psych ROS  GI/Hepatic/Renal negative GI ROS  negative Liver ROS  negative Renal ROS        Endo/Other  Negative Endocrine ROS (+)      Abdominal   Musculoskeletal   Hematology negative hematology ROS (+)   Peds  Reproductive/Obstetrics (+) Pregnancy    Anesthesia Other Findings             Anesthesia Physical Anesthesia Plan  ASA: II  Anesthesia Plan: Spinal   Post-op Pain Management:    Induction:   Airway Management Planned:   Additional Equipment:   Intra-op Plan:   Post-operative Plan:   Informed Consent: I have reviewed the patients History and Physical, chart, labs and discussed the procedure including the risks, benefits and alternatives for the proposed anesthesia with the patient or authorized representative who has indicated his/her understanding and acceptance.   Dental Advisory Given  Plan Discussed with: Surgeon and CRNA  Anesthesia Plan Comments:         Anesthesia Quick Evaluation

## 2011-07-09 NOTE — Transfer of Care (Signed)
Immediate Anesthesia Transfer of Care Note  Patient: Miranda Wood  Procedure(s) Performed:  CESAREAN SECTION WITH BILATERAL TUBAL LIGATION - Primary  Patient Location: PACU  Anesthesia Type: Spinal  Level of Consciousness: awake, alert  and oriented  Airway & Oxygen Therapy: Patient Spontanous Breathing  Post-op Assessment: Report given to PACU RN and Post -op Vital signs reviewed and stable  Post vital signs: Reviewed and stable  Complications: No apparent anesthesia complications

## 2011-07-09 NOTE — Consult Note (Addendum)
[name]: Asked by Dr. Stefano Gaul to attend delivery of this baby by repeat C/S at 39 weeks. Pregnancy complicated by twin gestation. Infant was vigorous at birth. Dried. Apgars 8/9. Voided in first 10 min of life. To central nursery. Care to TS.  Miranda Wood

## 2011-07-09 NOTE — Progress Notes (Signed)
Spoke to dr. Rodman Pickle to get dc order. Instructed to take pt to room and she would put dc in later.

## 2011-07-09 NOTE — Anesthesia Postprocedure Evaluation (Signed)
Anesthesia Post Note  Patient: Miranda Wood  Procedure(s) Performed:  CESAREAN SECTION WITH BILATERAL TUBAL LIGATION - Primary  Anesthesia type: Spinal  Patient location: PACU  Post pain: Pain level controlled  Post assessment: Post-op Vital signs reviewed  Last Vitals:  Filed Vitals:   07/09/11 1300  BP: 113/72  Pulse: 68  Temp: 97.5 F (36.4 C)  Resp: 18    Post vital signs: Reviewed  Level of consciousness: awake  Complications: No apparent anesthesia complications

## 2011-07-09 NOTE — H&P (Signed)
Miranda Wood, Miranda Wood              ACCOUNT NO.:  [id number]  MEDICAL RECORD NO.:  [id number]  LOCATION:                                 FACILITY:  PHYSICIAN:  Janine Limbo, M.D.DATE OF BIRTH:  08/13/1976  DATE OF ADMISSION:  07/09/2011 DATE OF DISCHARGE:                             HISTORY & PHYSICAL   HISTORY OF PRESENT ILLNESS:  Ms. Miranda Wood is a 35 year old female, gravida 6, para 3-0-2-3, who presents at 35 weeks and 1-day gestation (EDC is July 15, 2011, by ultrasound at 15 weeks and 1 day on January 08, 2011).  The patient has been followed at the [facility].  Her pregnancy has been complicated by the fact that she has twins.  The twins are diamniotic and monochorionic.  The patient has had chronic pain issues during this pregnancy.  She has been treated in the past with narcotic medication, but more recently she has been treated with Toradol.  Her second infant is noted to be in a breech presentation by ultrasound and she elects to have a cesarean delivery rather than attempt a vaginal delivery with subsequent risk of a cesarean section for delivery of the second infant.  She also desires permanent sterilization.  OBSTETRICAL HISTORY:  The patient has had 3 vaginal deliveries at term. She has had one elective first trimester pregnancy termination and one first trimester miscarriage.  DRUG ALLERGIES:  The patient reports that she is allergic to PENICILLIN and this causes breathing problems.  PAST MEDICAL HISTORY:  The patient has a history of atrial enlargement, but has done well during this pregnancy.  She has a history of a positive PPD and has had a negative chest x-ray.  CURRENT MEDICATIONS:  Prenatal vitamins, Tylenol, and Toradol as needed to control her pain.  SOCIAL HISTORY:  The patient smokes one pack of cigarettes each day. She drinks alcohol on the weekends, but has tried to  stop during this pregnancy.  She denies other recreational drug uses.  REVIEW OF SYSTEMS:  The patient complains of chronic pain in her pelvis, back, and in her hips.  FAMILY HISTORY:  Noncontributory.  PHYSICAL EXAMINATION:  VITAL SIGNS:  Weight is 199 pounds, height is 5 feet 8 inches. HEENT:  Within normal limits. CHEST:  Clear. HEART:  Regular rate and rhythm. BREASTS:  Without masses. ABDOMEN:  Gravid with fundal height of 42 cm. EXTREMITIES:  Grossly normal. NEUROLOGIC:  Grossly normal. PELVIC:  Cervix was closed and long when last checked.  LABORATORY VALUES:  Blood type is O positive.  Antibody screen negative. VDRL nonreactive.  Rubella immune.  HBsAg negative.  HIV nonreactive. Third trimester beta strep is negative.  ASSESSMENT: 1. 39 weeks and 1-day gestation. 2. Twin gestation with a vertex breech presentation. 3. Desires cesarean section rather than attempt a vaginal delivery. 4. Desires sterilization.  PLAN:  The patient will undergo a primary cesarean section and bilateral tubal ligation.  The patient understands the indications for her surgical procedure and she accepts the risks of, but not limited to, anesthetic complications, bleeding, infection, possible damage to the surrounding organs, and possible tubal failure (17 per  1000).     Janine Limbo, M.D.     AVS/MEDQ  D:  07/08/2011  T:  07/08/2011  Job:  [id number]

## 2011-07-09 NOTE — Brief Op Note (Signed)
Operative note  Miranda Wood  Date of birth: November 12, 1975  Medical records number: [id number]  CSN: [id number]  Date of surgery: 07/09/2011  Preoperative diagnosis:  [redacted] weeks gestation  Twins  Vertex breech presentation  Desires sterilization  Postoperative diagnosis:  Same  Procedure:  Primary low transverse cesarean section and bilateral tubal ligation  Surgeon:  Leonard Schwartz M.D.  First assistant:  Nigel Bridgeman, CNM  Anesthetic:  Spinal  Disposition:  Miranda Wood is a 35 year old female who presents at 39 weeks and 1 day gestation. She has been followed at the [facility] [place] [facility]. The pregnancy has been complicated by a twin gestation. She has elected not to attempt a vaginal delivery of the twins because of the potential for cesarean section of the breech infant. She wishes to proceed with operative delivery. She also desires sterilization. She understands the indications for surgical procedure and she accepts the risk of, but not limited to, anesthetic complications, bleeding, infections, and possible damage to the surrounding organs.  Findings:  The first infant delivered was a 7 lbs. 3 oz. female (Fate) with Apgar scores of 8 at 1 minute and 9 at 5 minutes. The second infant delivered was a 6 lbs. 10 oz. female Duwayne Heck) with Apgar scores of 7 at 1 minute and 9 at 5 minutes. The uterus, fallopian tubes, and the ovaries were normal for gravid state.  Procedure:  The patient was taken to the operating room where a spinal anesthetic was given. The patient's abdomen was prepped with Chloraprep. The perineum was prepped with multiple layers of Betadine. A Foley catheter was placed in the bladder. The patient was sterilely draped. The lower abdomen was injected with 10 cc of half percent Marcaine with epinephrine. A low transverse incision was made in the abdomen and carried sharply through the  subcutaneous tissue, the fascia, and the anterior peritoneum. An incision was made in the lower uterine segment and extended in a low transverse fashion. The first fetal head was delivered without difficulty. The mouth and nose were suctioned. The remainder of the infant was then delivered. The cord was clamped and cut and it was handed to the waiting pediatric team. The bag of water was broken for the second infant. The second infant was delivered from a breech extraction without difficulty. The mouth and nose were suctioned. The cord was clamped and cut infant was handed to the waiting pediatric team. The placenta was removed. Routine cord blood studies were obtained. The uterine cavity was cleaned of amniotic fluid, clotted blood, and membranes. The uterine incision was closed using a running locking suture of 2-0 Vicryl followed by an imbricating suture of 2-0 Vicryl. The pelvis was vigorously irrigated. Hemostasis was adequate. The left fallopian tube was identified and followed to fimbriated end. A knuckle of tube was made on the left using 2 free ties of 0 plain catgut. The knuckle of tube was cut. Hemostasis was adequate. An identical procedure was carried out on the opposite side. Again hemostasis was adequate. The anterior peritoneum was closed using a running suture of 2-0 Vicryl. The abdominal suture was reapproximated in the midline. The fascia was closed using a running suture of 0 Vicryl followed by 3 interrupted sutures of 0 Vicryl. The subcutaneous layer was closed using interrupted sutures of 0 Vicryl. The skin was reapproximated using a subcuticular suture of 3-0 Monocryl. Sponge, needle, and isthmic counts were correct on 2 occasions. The estimated blood loss for  the procedure was 1000 cc. The patient was transported to the recovery room in stable condition. The portions of the fallopian tubes were sent to pathology. The placenta was sent to the private company harvesting membranes. The patient  tolerated her procedure well. The infants were taken to the full-term nursery in stable condition. The patient was noted to drain clear yellow urine.

## 2011-07-09 NOTE — Anesthesia Procedure Notes (Signed)
Spinal Block  Patient location during procedure: OR Start time: 07/09/2011 10:39 AM Staffing Performed by: anesthesiologist  Preanesthetic Checklist Completed: patient identified, site marked, surgical consent, pre-op evaluation, timeout performed, IV checked, risks and benefits discussed and monitors and equipment checked Spinal Block Patient position: sitting Prep: DuraPrep Patient monitoring: heart rate, cardiac monitor, continuous pulse ox and blood pressure Approach: midline Location: L3-4 Injection technique: single-shot Needle Needle type: Sprotte  Needle gauge: 24 G Needle length: 9 cm Assessment Sensory level: T4

## 2011-07-09 NOTE — Brief Op Note (Signed)
The patient was interviewed and examined today prior to her procedure.  There are no changes to the previously documented history and physical examination. The operative procedure was reviewed. The risks and benefits were outlined again.  The patient's questions were answered.  We were ready to proceed as outlined.

## 2011-07-10 LAB — CBC
HCT: 27.3 % — ABNORMAL LOW (ref 36.0–46.0)
Hemoglobin: 9.5 g/dL — ABNORMAL LOW (ref 12.0–15.0)
WBC: 13.6 10*3/uL — ABNORMAL HIGH (ref 4.0–10.5)

## 2011-07-10 NOTE — Progress Notes (Addendum)
UR Chart review completed.  

## 2011-07-10 NOTE — Progress Notes (Signed)
Subjective: Postpartum Day 1: Cesarean Delivery Patient reports no problems voiding.  Up ad lib, using pain medication with benefit.  Plans breastfeeding of twins.  Foley removed this am.  Tolerating regular diet.  Denies flatus.  Objective: Vital signs in last 24 hours: Temp:  [97.4 F (36.3 C)-98.4 F (36.9 C)] 98.3 F (36.8 C) (09/11 0630) Pulse Rate:  [68-109] 73  (09/11 0630) Resp:  [16-20] 16  (09/11 0630) BP: (99-125)/(53-79) 100/60 mmHg (09/11 0630) SpO2:  [98 %-100 %] 98 % (09/10 2230) Weight:  [89.359 kg (197 lb)] 197 lb (89.359 kg) (09/10 1515)  Physical Exam:  General: alert.  No significant anxiety at present. Lochia: appropriate Uterine Fundus: firm Incision: Dressing CDI DVT Evaluation: No evidence of DVT seen on physical exam. Faint bowel sounds noted   Basename 07/10/11 0607  HGB 9.5*  HCT 27.3*  Hgb 11.6 on 07/04/11  Assessment/Plan: Status post Cesarean section and BTL.  Doing well postoperatively.  Continue current care.   Vertis Bauder L 07/10/2011, 10:30 AM

## 2011-07-11 NOTE — Progress Notes (Addendum)
Postpartum Day 2: Cesarean Delivery Subjective: Patient reports incisional pain, tolerating PO, + flatus and no problems voiding.   Bottle feeding babies +bonding, mood stable  Objective: Vital signs in last 24 hours: Temp:  [98 F (36.7 C)-99 F (37.2 C)] 98.8 F (37.1 C) (09/12 0501) Pulse Rate:  [65-97] 65  (09/12 0501) Resp:  [18] 18  (09/12 0501) BP: (90-108)/(60-68) 90/60 mmHg (09/12 0501) SpO2:  [97 %] 97 % (09/11 1100)  Physical Exam:  General: alert, distracted and no distress Lochia: appropriate Uterine Fundus: firm Incision: healing well DVT Evaluation: No evidence of DVT seen on physical exam. Negative Homan's sign.   Basename 07/10/11 0607  HGB 9.5*  HCT 27.3*    Assessment/Plan: Status post Cesarean section. Doing well postoperatively.  Continue current care Plan D/C tomorrow.  LILLARD,SHELLEY M 07/11/2011, 10:40 AM  Agree with above - AYR

## 2011-07-11 NOTE — Progress Notes (Signed)
Sw referral received to asses history of alcohol use. Pt told Sw that she has drank alcohol in the past but none during pregnancy. Drug screen was not ordered to test for substances. Pt does not appear to be in need of Sw intervention as this time.   

## 2011-07-12 MED ORDER — SERTRALINE HCL 50 MG PO TABS: 50.0000 mg | ORAL_TABLET | Freq: Every day | ORAL | Status: DC

## 2011-07-12 MED ORDER — IBUPROFEN 600 MG PO TABS: 600.0000 mg | ORAL_TABLET | Freq: Four times a day (QID) | ORAL | Status: AC | PRN

## 2011-07-12 MED ORDER — OXYCODONE-ACETAMINOPHEN 5-325 MG PO TABS: 1.0000 | ORAL_TABLET | ORAL | Status: AC | PRN

## 2011-07-12 MED ORDER — FERROUS SULFATE 325 (65 FE) MG PO TABS: 325.0000 mg | ORAL_TABLET | Freq: Two times a day (BID) | ORAL | Status: DC

## 2011-07-12 NOTE — Progress Notes (Signed)
Subjective: Postpartum Day 3: Cesarean Delivery Patient reports tolerating PO, + flatus, + BM and no problems voiding.    Objective: Vital signs in last 24 hours: Temp:  [98.2 F (36.8 C)-98.3 F (36.8 C)] 98.2 F (36.8 C) (09/13 0635) Pulse Rate:  [75-76] 75  (09/13 0635) Resp:  [18] 18  (09/13 0635) BP: (100-103)/(65-66) 103/65 mmHg (09/13 1610)  Physical Exam:  General: alert and no distress Lochia: appropriate Uterine Fundus: firm Incision: healing well, no significant drainage, no significant erythema DVT Evaluation: No evidence of DVT seen on physical exam.   Basename 07/10/11 0607  HGB 9.5*  HCT 27.3*    Assessment/Plan: Status post Cesarean section. Doing well postoperatively.  Discharge home with standard precautions and return to clinic in 4-6 weeks The patient denies dizziness. Transfusion offered. Risks and benefits reviewed. Declined for now. Will take iron.  Naome Brigandi V 07/12/2011, 9:13 AM

## 2011-07-25 NOTE — Discharge Summary (Signed)
   Obstetric Discharge Summary Reason for Admission: cesarean section and BTL  Prenatal Procedures: ultrasound Intrapartum Procedures: cesarean: low cervical, transverse and tubal ligation Postpartum Procedures: none Complications-Operative and Postpartum: none    Hemoglobin  Date Value Range Status  07/10/2011 9.5* 12.0-15.0 (g/dL) Final     HCT  Date Value Range Status  07/10/2011 27.3* 36.0-46.0 (%) Final    Hospital Course:  Pt was admitted for scheduled LTCS for term twins in a vertex/breech presentation, and BTL. Surgery was performed by Dr. Stefano Gaul and was without complications, pt then followed a routine PP course without any complications.   Discharge Diagnoses: term twin pregnancy delivered  Discharge Information: Date: 07/25/2011 Activity: pelvic rest Diet: routine Medications:  Medication List  As of 07/25/2011  6:44 PM   START taking these medications         ferrous sulfate 325 (65 FE) MG tablet   Take 1 tablet (325 mg total) by mouth 2 (two) times daily before a meal.      sertraline 50 MG tablet   Commonly known as: ZOLOFT   Take 1 tablet (50 mg total) by mouth daily.         CONTINUE taking these medications         acetaminophen 500 MG tablet   Commonly known as: TYLENOL      prenatal vitamin w/FE, FA 27-1 MG Tabs      TYLENOL PM EXTRA STRENGTH PO          Where to get your medications    These are the prescriptions that you need to pick up.   You may get these medications from any pharmacy.         ferrous sulfate 325 (65 FE) MG tablet   sertraline 50 MG tablet           Condition: stable Instructions: refer to practice specific booklet Discharge to: home Follow-up Information    Follow up with Dr. Stefano Gaul in 6 weeks.         Newborn Data: Live born  Information for the patient's newborn:  Jessee Avers [[id number]]  female Information for the patient's newborn:  Clarisa Schools [[id number]]  female  Baby A  APGAR , 8,9 weight   7#3oz  Baby B APGAR 7,9 Weight 6#10oz  Home with mother .  Miranda Wood M 07/25/2011, 6:44 PM

## 2011-08-07 LAB — COMPREHENSIVE METABOLIC PANEL
ALT: 11
CO2: 24
Calcium: 8.9
Chloride: 103
Creatinine, Ser: 0.62
GFR calc non Af Amer: >60
Glucose, Bld: 113 — ABNORMAL HIGH
Total Bilirubin: 0.7

## 2011-08-07 LAB — DIFFERENTIAL
Basophils Absolute: 0.1
Eosinophils Absolute: 0.1 — ABNORMAL LOW
Lymphocytes Relative: 26
Lymphs Abs: 2
Neutrophils Relative %: 65

## 2011-08-07 LAB — CBC
HCT: 36.2
Hemoglobin: 12.7
MCHC: 35.2
MCV: 93.1
RBC: 3.89

## 2012-05-13 ENCOUNTER — Encounter (HOSPITAL_COMMUNITY): Payer: Self-pay | Admitting: *Deleted

## 2012-05-13 ENCOUNTER — Emergency Department (HOSPITAL_COMMUNITY)
Admission: EM | Admit: 2012-05-13 | Discharge: 2012-05-13 | Disposition: A | Discharge status: Home / Self Care | Payer: Medicaid Other | Attending: Emergency Medicine | Admitting: Emergency Medicine

## 2012-05-13 ENCOUNTER — Emergency Department (HOSPITAL_COMMUNITY): Payer: Medicaid Other

## 2012-05-13 DIAGNOSIS — R109 Unspecified abdominal pain: Secondary | ICD-10-CM | POA: Insufficient documentation

## 2012-05-13 DIAGNOSIS — N12 Tubulo-interstitial nephritis, not specified as acute or chronic: Secondary | ICD-10-CM | POA: Insufficient documentation

## 2012-05-13 LAB — URINALYSIS, ROUTINE W REFLEX MICROSCOPIC
Glucose, UA: NEGATIVE mg/dL
Ketones, ur: NEGATIVE mg/dL
Nitrite: POSITIVE — AB
Protein, ur: 100 mg/dL — AB

## 2012-05-13 LAB — BASIC METABOLIC PANEL
CO2: 24 mEq/L (ref 19–32)
Chloride: 103 mEq/L (ref 96–112)
Creatinine, Ser: 0.61 mg/dL (ref 0.50–1.10)
GFR calc Af Amer: >90 mL/min (ref >90)
Potassium: 3.6 mEq/L (ref 3.5–5.1)
Sodium: 137 mEq/L (ref 135–145)

## 2012-05-13 LAB — CBC WITH DIFFERENTIAL/PLATELET
Basophils Absolute: 0 10*3/uL (ref 0.0–0.1)
Basophils Relative: 0 % (ref 0–1)
HCT: 36.9 % (ref 36.0–46.0)
Lymphocytes Relative: 18 % (ref 12–46)
MCHC: 33.6 g/dL (ref 30.0–36.0)
Monocytes Absolute: 0.7 10*3/uL (ref 0.1–1.0)
Neutro Abs: 6.6 10*3/uL (ref 1.7–7.7)
Neutrophils Relative %: 73 % (ref 43–77)
Platelets: 296 10*3/uL (ref 150–400)
RDW: 12.8 % (ref 11.5–15.5)
WBC: 9.1 10*3/uL (ref 4.0–10.5)

## 2012-05-13 LAB — URINE MICROSCOPIC-ADD ON

## 2012-05-13 LAB — PREGNANCY, URINE: Preg Test, Ur: NEGATIVE

## 2012-05-13 MED ORDER — MORPHINE SULFATE 2 MG/ML IJ SOLN
2.0000 mg | Freq: Once | INTRAMUSCULAR | Status: AC
  Administered 2012-05-13: 2 mg via INTRAVENOUS
  Filled 2012-05-13: qty 1

## 2012-05-13 MED ORDER — KETOROLAC TROMETHAMINE 30 MG/ML IJ SOLN
30.0000 mg | Freq: Once | INTRAMUSCULAR | Status: AC
  Administered 2012-05-13: 30 mg via INTRAVENOUS
  Filled 2012-05-13: qty 1

## 2012-05-13 MED ORDER — MORPHINE SULFATE 4 MG/ML IJ SOLN
4.0000 mg | Freq: Once | INTRAMUSCULAR | Status: AC
  Administered 2012-05-13: 2 mg via INTRAVENOUS
  Filled 2012-05-13: qty 1

## 2012-05-13 MED ORDER — PROMETHAZINE HCL 12.5 MG PO TABS: 12.5000 mg | ORAL_TABLET | Freq: Four times a day (QID) | ORAL | Status: DC | PRN

## 2012-05-13 MED ORDER — DEXTROSE 5 % IV SOLN
1.0000 g | Freq: Once | INTRAVENOUS | Status: AC
  Administered 2012-05-13: 1 g via INTRAVENOUS
  Filled 2012-05-13: qty 10

## 2012-05-13 MED ORDER — HYDROCODONE-ACETAMINOPHEN 5-325 MG PO TABS: 1.0000 | ORAL_TABLET | ORAL | Status: AC | PRN

## 2012-05-13 MED ORDER — SULFAMETHOXAZOLE-TRIMETHOPRIM 800-160 MG PO TABS: 1.0000 | ORAL_TABLET | Freq: Two times a day (BID) | ORAL | Status: AC

## 2012-05-13 MED ORDER — ONDANSETRON HCL 4 MG/2ML IJ SOLN
4.0000 mg | Freq: Once | INTRAMUSCULAR | Status: AC
  Administered 2012-05-13: 4 mg via INTRAVENOUS
  Filled 2012-05-13: qty 8

## 2012-05-13 MED ORDER — SODIUM CHLORIDE 0.9 % IV BOLUS (SEPSIS)
1000.0000 mL | Freq: Once | INTRAVENOUS | Status: AC
  Administered 2012-05-13: 1000 mL via INTRAVENOUS

## 2012-05-13 NOTE — ED Provider Notes (Signed)
History     CSN: [id number]  Arrival date & time 05/13/12  1733   First MD Initiated Contact with Patient 05/13/12 1829      Chief Complaint  Patient presents with  . Flank Pain    (Consider location/radiation/quality/duration/timing/severity/associated sxs/prior treatment) HPI Comments: Patient presents with cc of "kidney infection". She has a history of multiple UTIs with her pregnancies. She states that a few days ago she began having dysuria which progressed into suprapubic pain. Last nigrht she started developing right flank pain which has gotten increasingly worse with associated nausea but no vomiting.  She also c/o frequency and foul urinary odor, but denies hematuria.  She denies any injury to her back. She denies any vaginal discharge, burning, pain or foul odor. She denies any changes in bowel habit or constipation. Patient is married and in a monogamous relationship with one sexual partner.    Patient is a 36 y.o. female presenting with flank pain. The history is provided by the patient and medical records.  Flank Pain Pertinent negatives include no chills or fever.    Past Medical History  Diagnosis Date  . Complication of anesthesia     HR and B/P were elevated with epidural    Past Surgical History  Procedure Date  . Leg surgery   . Lipoma excision 2011  . Vaginal del x3     No family history on file.  History  Substance Use Topics  . Smoking status: Current Everyday Smoker -- 1.0 packs/day  . Smokeless tobacco: Not on file  . Alcohol Use: No    OB History    Grav Para Term Preterm Abortions TAB SAB Ect Mult Living   6 4 4  2  2  1 5       Review of Systems  Constitutional: Negative.  Negative for fever and chills.  Gastrointestinal: Negative for abdominal distention.  Genitourinary: Positive for dysuria, urgency and flank pain. Negative for hematuria, vaginal discharge, difficulty urinating, vaginal pain and pelvic pain.    Allergies    Penicillins  Home Medications  No current outpatient prescriptions on file.  BP 132/90  Pulse 111  Temp 98.7 F (37.1 C) (Oral)  Resp 16  SpO2 100%  Breastfeeding? Unknown  Physical Exam  Nursing note and vitals reviewed. Constitutional: She is oriented to person, place, and time. She appears well-developed and well-nourished.       Patient appears flushed and anxious.  HENT:  Head: Normocephalic and atraumatic.  Eyes: Conjunctivae are normal. No scleral icterus.  Cardiovascular: Regular rhythm and normal heart sounds.        Tachycardic to 111  Pulmonary/Chest: Effort normal and breath sounds normal. No respiratory distress. She has no wheezes.  Abdominal: Soft. Bowel sounds are normal. She exhibits no distension and no mass. There is tenderness. There is rebound (positive rebound tenderness in RLQ, negative psoas and obturator sign.). There is no guarding.  Neurological: She is alert and oriented to person, place, and time.  Skin:       Skin is flushed    ED Course  Procedures (including critical care time)  Results for orders placed during the hospital encounter of 05/13/12  URINALYSIS, ROUTINE W REFLEX MICROSCOPIC      Component Value Range   Color, Urine YELLOW  YELLOW   APPearance TURBID (*) CLEAR   Specific Gravity, Urine 1.016  1.005 - 1.030   pH 7.0  5.0 - 8.0   Glucose, UA NEGATIVE  NEGATIVE mg/dL  Hgb urine dipstick LARGE (*) NEGATIVE   Bilirubin Urine NEGATIVE  NEGATIVE   Ketones, ur NEGATIVE  NEGATIVE mg/dL   Protein, ur 161 (*) NEGATIVE mg/dL   Urobilinogen, UA 0.2  0.0 - 1.0 mg/dL   Nitrite POSITIVE (*) NEGATIVE   Leukocytes, UA LARGE (*) NEGATIVE  CBC WITH DIFFERENTIAL      Component Value Range   WBC 9.1  4.0 - 10.5 K/uL   RBC 4.08  3.87 - 5.11 MIL/uL   Hemoglobin 12.4  12.0 - 15.0 g/dL   HCT 09.6  04.5 - 40.9 %   MCV 90.4  78.0 - 100.0 fL   MCH 30.4  26.0 - 34.0 pg   MCHC 33.6  30.0 - 36.0 g/dL   RDW 81.1  91.4 - 78.2 %   Platelets 296   150 - 400 K/uL   Neutrophils Relative 73  43 - 77 %   Neutro Abs 6.6  1.7 - 7.7 K/uL   Lymphocytes Relative 18  12 - 46 %   Lymphs Abs 1.6  0.7 - 4.0 K/uL   Monocytes Relative 8  3 - 12 %   Monocytes Absolute 0.7  0.1 - 1.0 K/uL   Eosinophils Relative 1  0 - 5 %   Eosinophils Absolute 0.1  0.0 - 0.7 K/uL   Basophils Relative 0  0 - 1 %   Basophils Absolute 0.0  0.0 - 0.1 K/uL  BASIC METABOLIC PANEL      Component Value Range   Sodium 137  135 - 145 mEq/L   Potassium 3.6  3.5 - 5.1 mEq/L   Chloride 103  96 - 112 mEq/L   CO2 24  19 - 32 mEq/L   Glucose, Bld 102 (*) 70 - 99 mg/dL   BUN 6  6 - 23 mg/dL   Creatinine, Ser 9.56  0.50 - 1.10 mg/dL   Calcium 9.1  8.4 - 21.3 mg/dL   GFR calc non Af Amer >90  >90 mL/min   GFR calc Af Amer >90  >90 mL/min  PREGNANCY, URINE      Component Value Range   Preg Test, Ur NEGATIVE  NEGATIVE  URINE MICROSCOPIC-ADD ON      Component Value Range   Squamous Epithelial / LPF FEW (*) RARE   WBC, UA TOO NUMEROUS TO COUNT  <3 WBC/hpf   RBC / HPF 3-6  <3 RBC/hpf   Bacteria, UA FEW (*) RARE   Urine-Other MUCOUS PRESENT     Pt given 1 g IV Rocephin and pain medication for UTI  No results found.  CT Abdomen Pelvis Wo Contrast (Final result)   Result time:05/13/12 2121    Final result by Rad Results In Interface (05/13/12 21:21:17)    Narrative:   *RADIOLOGY REPORT*  Clinical Data: Right-sided flank pain  CT ABDOMEN AND PELVIS WITHOUT CONTRAST  Technique: Multidetector CT imaging of the abdomen and pelvis was performed following the standard protocol without intravenous contrast.  Comparison: No similar prior study is available for comparison.  Findings: Lung bases are clear.  Unenhanced liver, gallbladder, adrenal glands, kidneys, spleen, and pancreas are normal. No ascites or lymphadenopathy.  Bladder wall thickness at upper limits of normal with physiologic bladder distention. No radiopaque renal, ureteral, or bladder calculus. Uterus  and ovaries are normal. No bowel wall thickening or focal segmental dilatation. Appendix is normal. Fat containing umbilical hernia, image 47. Minimal leftward curvature of the lumbar spine noted. No acute osseous finding.  IMPRESSION: No acute  intra-abdominal or pelvic pathology. The appendix is specifically normal in appearance, and there is no radiopaque renal, ureteral, or bladder calculus.  Bladder wall thickness at upper limits of normal. Correlate with any symptoms of possible cystitis.  Original Report Authenticated By: Harrel Lemon, M.D.      1. Pyelonephritis       MDM  Patient refused Pelvic exam at this time.     Patient is feeling better. She still c/o pain, but says that she is feeling better.  She is able to tolerate liquids PO and is no longer tachycardic. Renal function is normal. She has no Leukocytosis. On Physical exam, flushing has decreased, but pain is still very bad. Patient appears uncomfortable and asks to get up and move around, I'd like to get an CT of the abdomen to r/o kidney stone.   CT negative Patient will be d/c with pain meds and abx.       Arthor Captain, PA-C 05/25/12 2023

## 2012-05-13 NOTE — ED Notes (Signed)
Pt reports right side flank pain x 2 days. Nausea, no vomiting. States has kidney infection.

## 2012-05-13 NOTE — ED Notes (Signed)
PA at bedside Pt alert and oriented x4. Respirations even and unlabored, bilateral symmetrical rise and fall of chest. Skin warm and dry. In no acute distress. Denies needs.   

## 2012-05-15 LAB — URINE CULTURE

## 2012-05-16 NOTE — ED Notes (Signed)
+  Urine. Patient given Septra DS. Sensitive to same. Per protocol MD.

## 2012-05-17 NOTE — ED Notes (Signed)
Chart returned from EDP office. Prescribed Macrobid 100 mg tab. One tab PO BID. Disp #14. Prescribed by Jaynie Crumble PA-C.

## 2012-05-17 NOTE — ED Notes (Signed)
Attempted to contact patient. No answer. Left voicemail for patient to call back. °

## 2012-05-17 NOTE — ED Notes (Signed)
Patient called back and was informed of new Rx. Rx called to [facility] on [address] [id number]). Rx called in by Norm Parcel [facility].

## 2012-05-25 NOTE — ED Provider Notes (Signed)
Medical screening examination/treatment/procedure(s) were performed by non-physician practitioner and as supervising physician I was immediately available for consultation/collaboration.  Flint Melter, MD 05/25/12 2039

## 2012-10-21 ENCOUNTER — Emergency Department (HOSPITAL_COMMUNITY)
Admission: EM | Admit: 2012-10-21 | Discharge: 2012-10-21 | Disposition: A | Discharge status: Home / Self Care | Payer: Medicaid Other | Attending: Emergency Medicine | Admitting: Emergency Medicine

## 2012-10-21 ENCOUNTER — Encounter (HOSPITAL_COMMUNITY): Payer: Self-pay

## 2012-10-21 DIAGNOSIS — M545 Low back pain, unspecified: Secondary | ICD-10-CM | POA: Insufficient documentation

## 2012-10-21 DIAGNOSIS — R1013 Epigastric pain: Secondary | ICD-10-CM

## 2012-10-21 DIAGNOSIS — R110 Nausea: Secondary | ICD-10-CM | POA: Insufficient documentation

## 2012-10-21 DIAGNOSIS — F17200 Nicotine dependence, unspecified, uncomplicated: Secondary | ICD-10-CM | POA: Insufficient documentation

## 2012-10-21 DIAGNOSIS — Z3202 Encounter for pregnancy test, result negative: Secondary | ICD-10-CM | POA: Insufficient documentation

## 2012-10-21 LAB — CBC
MCH: 29.8 pg (ref 26.0–34.0)
MCV: 88.3 fL (ref 78.0–100.0)
Platelets: 218 10*3/uL (ref 150–400)
RDW: 12.8 % (ref 11.5–15.5)
WBC: 3.6 10*3/uL — ABNORMAL LOW (ref 4.0–10.5)

## 2012-10-21 LAB — COMPREHENSIVE METABOLIC PANEL
ALT: 7 U/L (ref 0–35)
AST: 17 U/L (ref 0–37)
Albumin: 3.7 g/dL (ref 3.5–5.2)
Calcium: 8.6 mg/dL (ref 8.4–10.5)
Creatinine, Ser: 0.63 mg/dL (ref 0.50–1.10)
Sodium: 135 mEq/L (ref 135–145)

## 2012-10-21 LAB — URINALYSIS, ROUTINE W REFLEX MICROSCOPIC
Bilirubin Urine: NEGATIVE
Hgb urine dipstick: NEGATIVE
Nitrite: NEGATIVE
Protein, ur: NEGATIVE mg/dL
Specific Gravity, Urine: 1.025 (ref 1.005–1.030)
Urobilinogen, UA: 1 mg/dL (ref 0.0–1.0)

## 2012-10-21 LAB — LIPASE, BLOOD: Lipase: 33 U/L (ref 11–59)

## 2012-10-21 MED ORDER — OXYCODONE-ACETAMINOPHEN 5-325 MG PO TABS
1.0000 | ORAL_TABLET | Freq: Once | ORAL | Status: AC
  Administered 2012-10-21: 1 via ORAL
  Filled 2012-10-21: qty 1

## 2012-10-21 MED ORDER — ONDANSETRON HCL 4 MG PO TABS: 4.0000 mg | ORAL_TABLET | Freq: Four times a day (QID) | ORAL | Status: DC

## 2012-10-21 MED ORDER — OXYCODONE-ACETAMINOPHEN 5-325 MG PO TABS: 1.0000 | ORAL_TABLET | Freq: Four times a day (QID) | ORAL | Status: DC | PRN

## 2012-10-21 MED ORDER — ONDANSETRON 4 MG PO TBDP
4.0000 mg | ORAL_TABLET | Freq: Once | ORAL | Status: AC
  Administered 2012-10-21: 4 mg via ORAL
  Filled 2012-10-21: qty 1

## 2012-10-21 MED ORDER — RANITIDINE HCL 150 MG PO TABS: 150.0000 mg | ORAL_TABLET | Freq: Two times a day (BID) | ORAL | Status: DC

## 2012-10-21 NOTE — ED Notes (Signed)
[id number]<AV> Expected date:<BR> Expected time:<BR> Means of arrival:<BR> Comments:<BR> Hold per CN

## 2012-10-21 NOTE — ED Notes (Signed)
Patient reports epigastric cramping, nausea, headache, fatigue and lower back more so on the left lower. Patient reports that she fell a month ago and fell on her tail bone and has had back pain intermittently. Patient denies vomiting, diarrhea, or dysuria.

## 2012-10-21 NOTE — ED Provider Notes (Signed)
History     CSN: [id number]  Arrival date & time 10/21/12  1017   First MD Initiated Contact with Patient 10/21/12 1035      Chief Complaint  Patient presents with  . Abdominal Pain  . Back Pain    (Consider location/radiation/quality/duration/timing/severity/associated sxs/prior treatment) HPI Pt presents with c/o epigastric pain and cramping which she states started yesterday.  Associated with some nausea but no vomiting.  No fever/chills.  No changes in bowel movement.  No dysuria.  Pain has no relation with food intake.  She also c/o low back pain on left side.  She states she fell several weeks ago and feels that she hurt her tailbone and now c/o low back pain.  Worse with movement and palpation.  States the area of pain is completely different than when she was diagnosed with pyelonephritis several months ago.  There are no other associated systemic symptoms, there are no other alleviating or modifying factors.   Past Medical History  Diagnosis Date  . Complication of anesthesia     HR and B/P were elevated with epidural    Past Surgical History  Procedure Date  . Leg surgery   . Lipoma excision 2011  . Vaginal del x3     No family history on file.  History  Substance Use Topics  . Smoking status: Current Every Day Smoker -- 0.5 packs/day    Types: Cigarettes  . Smokeless tobacco: Never Used  . Alcohol Use: No    OB History    Grav Para Term Preterm Abortions TAB SAB Ect Mult Living   6 4 4  2  2  1 5       Review of Systems ROS reviewed and all otherwise negative except for mentioned in HPI  Allergies  Penicillins  Home Medications  No current outpatient prescriptions on file.  BP 135/77  Pulse 104  Temp 98.4 F (36.9 C) (Oral)  Resp 16  SpO2 100%  LMP 10/07/2012 Vitals reviewed Physical Exam Physical Examination: General appearance - alert, well appearing, and in no distress Mental status - alert, oriented to person, place, and time Eyes -  no conjunctival injection, no scleral icterus Mouth - mucous membranes moist, pharynx normal without lesions Chest - clear to auscultation, no wheezes, rales or rhonchi, symmetric air entry Heart - normal rate, regular rhythm, normal S1, S2, no murmurs, rubs, clicks or gallops Abdomen - soft, mild ttp in epigastric region, no gaurding or rebound tenderness, nondistended, no masses or organomegaly, nabs Back exam - no midline ttp, ttp over left lumbar paraspinous region, full range of motion, no CVA tenderness Extremities - peripheral pulses normal, no pedal edema, no clubbing or cyanosis Neurology- alert and oriented, normal speech, strength 5/5 in extremities x 4, sensation intact distally Skin - normal coloration and turgor, no rashes, brisk cap refill  ED Course  Procedures (including critical care time)  Labs Reviewed  CBC - Abnormal; Notable for the following:    WBC 3.6 (*)     All other components within normal limits  COMPREHENSIVE METABOLIC PANEL - Abnormal; Notable for the following:    Glucose, Bld 103 (*)     All other components within normal limits  URINALYSIS, ROUTINE W REFLEX MICROSCOPIC  PREGNANCY, URINE  LIPASE, BLOOD   No results found.   1. Epigastric pain   2. Low back pain       MDM  Pt presenting with c/o epigastric pain.  Labs reassuring.  No indication for  ultrasound at this time.  Will start trial of H2 blocker and nausea meds.  Back pain is most likely musculoskeletal.  No fever or signs or symptoms of cauda equina, no midline tenderness.  Discharged with strict return precautions.  Pt agreeable with plan.        Ethelda Chick, MD 10/21/12 [phone]

## 2012-11-07 ENCOUNTER — Encounter (HOSPITAL_COMMUNITY): Payer: Self-pay | Admitting: *Deleted

## 2012-11-07 ENCOUNTER — Emergency Department (HOSPITAL_COMMUNITY)
Admission: EM | Admit: 2012-11-07 | Discharge: 2012-11-07 | Disposition: A | Discharge status: Home / Self Care | Payer: Medicaid Other | Attending: Emergency Medicine | Admitting: Emergency Medicine

## 2012-11-07 DIAGNOSIS — Z87891 Personal history of nicotine dependence: Secondary | ICD-10-CM | POA: Insufficient documentation

## 2012-11-07 DIAGNOSIS — K089 Disorder of teeth and supporting structures, unspecified: Secondary | ICD-10-CM | POA: Insufficient documentation

## 2012-11-07 DIAGNOSIS — Z9889 Other specified postprocedural states: Secondary | ICD-10-CM | POA: Insufficient documentation

## 2012-11-07 DIAGNOSIS — K0889 Other specified disorders of teeth and supporting structures: Secondary | ICD-10-CM

## 2012-11-07 DIAGNOSIS — R6884 Jaw pain: Secondary | ICD-10-CM | POA: Insufficient documentation

## 2012-11-07 DIAGNOSIS — K1370 Unspecified lesions of oral mucosa: Secondary | ICD-10-CM | POA: Insufficient documentation

## 2012-11-07 MED ORDER — OXYCODONE-ACETAMINOPHEN 5-325 MG PO TABS: 1.0000 | ORAL_TABLET | Freq: Four times a day (QID) | ORAL | Status: DC | PRN

## 2012-11-07 NOTE — ED Provider Notes (Signed)
History  This chart was scribed for Magnus Sinning, PA-C, working with Hurman Horn, MD by Madaline Brilliant and Shari Heritage, ED Scribe. This patient was seen in room WTR7/WTR7 and the patient's care was started at 1954.  CSN: [id number]  Arrival date & time 11/07/12  1757   First MD Initiated Contact with Patient 11/07/12 1954      Chief Complaint  Patient presents with  . Dental Pain     Patient is a 37 y.o. female presenting with tooth pain. The history is provided by the patient. No language interpreter was used.  Dental PainThe primary symptoms include mouth pain. Primary symptoms do not include fever. The symptoms began 6 to 12 hours ago. The symptoms are worsening. The symptoms are new. The symptoms occur constantly.  Mouth pain began 6 - 12 hours ago. Mouth pain occurs constantly. Affected locations include: teeth.  Additional symptoms include: jaw pain. Additional symptoms do not include: trismus.    HPI Comments: Miranda Wood is a 37 y.o. female who presents to the Emergency Department complaining of moderate to severe, constant, gradually worsening, throbbing left lower dental pain and jaw pain onset 7-8 hours ago. Patient says that she was at the dentist getting a cavity filled today, when suddenly the dentist stopped. Patient says dentist told her that she needed to see an oral surgeon and applied a sealant to one of her left lower molars. She was a given a referfal, but she hasn't been able to make an appointment for next week yet because it's the weekend. Patient states that when numbness wore off, throbbing pain started.  Patient reports that she has difficulty eating due to pain and is afraid to brush her teeth for concern that sealant will fall off. Patient denies any pain or discomfort to teeth or jaw prior to her dental visit.   Patient denies fever or chills.     Past Medical History  Diagnosis Date  . Complication of anesthesia     HR and B/P were elevated with  epidural    Past Surgical History  Procedure Date  . Leg surgery   . Lipoma excision 2011  . Vaginal del x3   . Cesarean section     No family history on file.  History  Substance Use Topics  . Smoking status: Former Smoker -- 0.5 packs/day    Types: Cigarettes    Quit date: 10/29/2012  . Smokeless tobacco: Never Used  . Alcohol Use: No    OB History    Grav Para Term Preterm Abortions TAB SAB Ect Mult Living   6 4 4  2  2  1 5       Review of Systems  Constitutional: Negative for fever.  HENT: Positive for dental problem.   All other systems reviewed and are negative.    Allergies  Penicillins  Home Medications  No current outpatient prescriptions on file.  Triage Vitals: BP 137/76  Pulse 92  Temp 98 F (36.7 C) (Oral)  Resp 16  SpO2 100%  LMP 11/04/2012  Physical Exam  Nursing note and vitals reviewed. Constitutional: She appears well-developed and well-nourished.  HENT:  Head: Normocephalic and atraumatic.  Mouth/Throat: Oropharynx is clear and moist.       Sealant over two posterior left lower molars. No exposed pulp or dentin. No erythema, swelling or drainage of jaw. No trismus. No swelling of submandibular or submental lymph nodes.   Eyes: EOM are normal. Pupils are equal, round,  and reactive to light.  Neck: Normal range of motion. Neck supple.  Cardiovascular: Normal rate, regular rhythm and normal heart sounds.   Pulmonary/Chest: Effort normal and breath sounds normal.  Musculoskeletal: Normal range of motion.  Neurological: She is alert.  Skin: Skin is warm and dry.  Psychiatric: She has a normal mood and affect. Her behavior is normal.     ED Course  Procedures (including critical care time) DIAGNOSTIC STUDIES: Oxygen Saturation is 100% on room air, normal by my interpretation.    COORDINATION OF CARE:  8:40 PM- Patient informed of current plan for treatment and evaluation and agrees with plan at this time.    Labs Reviewed - No  data to display No results found.   No diagnosis found.    MDM  Patient with toothache.  Tooth covered with sealant.  No gross abscess.  Exam unconcerning for Ludwig's angina or spread of infection.  Will treat with pain medication.  Urged patient to follow-up with oral surgeon.   I personally performed the services described in this documentation, which was scribed in my presence. The recorded information has been reviewed and is accurate.    Pascal Lux [place], PA-C 11/08/12 0157

## 2012-11-07 NOTE — ED Notes (Signed)
Pt reports being seen at dentist at noon today and was supposed to have multiple fillings. Sts the dentist got through most of the procedure but referred her an oral surgeon after not being able to complete filling two of the back teeth on lower jaw. Pt sts she called oral surgeon but office is closed on Fridays and cannot be seen. C/o pain to lower jaw and teeth, giving her a HA and anxiety.

## 2012-11-14 NOTE — ED Provider Notes (Signed)
Medical screening examination/treatment/procedure(s) were performed by non-physician practitioner and as supervising physician I was immediately available for consultation/collaboration.   Hurman Horn, MD 11/14/12 2124

## 2014-08-30 ENCOUNTER — Encounter (HOSPITAL_COMMUNITY): Payer: Self-pay | Admitting: *Deleted

## 2014-09-08 ENCOUNTER — Emergency Department (HOSPITAL_BASED_OUTPATIENT_CLINIC_OR_DEPARTMENT_OTHER)
Admission: EM | Admit: 2014-09-08 | Discharge: 2014-09-08 | Disposition: A | Discharge status: Home / Self Care | Payer: Medicaid Other | Attending: Emergency Medicine | Admitting: Emergency Medicine

## 2014-09-08 ENCOUNTER — Emergency Department (HOSPITAL_BASED_OUTPATIENT_CLINIC_OR_DEPARTMENT_OTHER): Payer: Medicaid Other

## 2014-09-08 ENCOUNTER — Encounter (HOSPITAL_BASED_OUTPATIENT_CLINIC_OR_DEPARTMENT_OTHER): Payer: Self-pay

## 2014-09-08 DIAGNOSIS — R51 Headache: Secondary | ICD-10-CM | POA: Insufficient documentation

## 2014-09-08 DIAGNOSIS — Z880 Allergy status to penicillin: Secondary | ICD-10-CM | POA: Insufficient documentation

## 2014-09-08 DIAGNOSIS — R519 Headache, unspecified: Secondary | ICD-10-CM

## 2014-09-08 DIAGNOSIS — Z720 Tobacco use: Secondary | ICD-10-CM | POA: Insufficient documentation

## 2014-09-08 MED ORDER — DEXAMETHASONE SODIUM PHOSPHATE 10 MG/ML IJ SOLN: 10.0000 mg | Freq: Once | INTRAMUSCULAR | Status: DC

## 2014-09-08 MED ORDER — DIPHENHYDRAMINE HCL 50 MG/ML IJ SOLN
25.0000 mg | Freq: Once | INTRAMUSCULAR | Status: AC
  Administered 2014-09-08: 25 mg via INTRAVENOUS
  Filled 2014-09-08: qty 1

## 2014-09-08 MED ORDER — METOCLOPRAMIDE HCL 5 MG/ML IJ SOLN
10.0000 mg | Freq: Once | INTRAMUSCULAR | Status: AC
  Administered 2014-09-08: 10 mg via INTRAVENOUS
  Filled 2014-09-08: qty 2

## 2014-09-08 MED ORDER — SODIUM CHLORIDE 0.9 % IV BOLUS (SEPSIS)
1000.0000 mL | Freq: Once | INTRAVENOUS | Status: AC
  Administered 2014-09-08: 1000 mL via INTRAVENOUS

## 2014-09-08 NOTE — ED Notes (Signed)
Mother in law called to pick up patient

## 2014-09-08 NOTE — ED Notes (Signed)
Pt very emotional and crying upon MD assessment.  Denies numbness or tingling.  Pt smells of ETOH.

## 2014-09-08 NOTE — ED Notes (Signed)
Per EMS patient went out last night drinking and per daughter patient got home and was stumbling.  Pt reports PTA sudden onset of headache. Pt reports only had 6 beers. Denies n/v.

## 2014-09-08 NOTE — ED Provider Notes (Signed)
CSN: [id number]     Arrival date & time 09/08/14  [id number] History   First MD Initiated Contact with Patient 09/08/14 [phone]     Chief Complaint  Patient presents with  . Headache     (Consider location/radiation/quality/duration/timing/severity/associated sxs/prior Treatment) Patient is a 38 y.o. female presenting with headaches. The history is provided by the patient.  Headache Pain location:  Frontal Quality:  Sharp and stabbing Radiates to:  Does not radiate Severity currently:  10/10 Severity at highest:  10/10 Onset quality:  Sudden Duration:  1 hour Timing:  Constant Progression:  Unchanged Chronicity:  New Similar to prior headaches: no   Context: not activity, not eating and not straining   Context comment:  Pt states she went out last night and had 6 beers and this morning when she was sitting at home she developed a sudden headache  Relieved by:  None tried Worsened by:  Light Ineffective treatments:  None tried Associated symptoms: photophobia   Associated symptoms: no abdominal pain, no back pain, no blurred vision, no cough, no diarrhea, no focal weakness, no loss of balance, no myalgias, no nausea, no near-syncope, no neck pain, no numbness, no seizures, no sinus pressure, no tingling and no vomiting   Risk factors comment:  Hx of migraines   Past Medical History  Diagnosis Date  . Complication of anesthesia     HR and B/P were elevated with epidural   Past Surgical History  Procedure Laterality Date  . Leg surgery    . Lipoma excision  2011  . Vaginal del x3    . Cesarean section     No family history on file. History  Substance Use Topics  . Smoking status: Current Every Day Smoker -- 0.50 packs/day    Types: Cigarettes    Last Attempt to Quit: 10/29/2012  . Smokeless tobacco: Never Used  . Alcohol Use: Yes   OB History    Gravida Para Term Preterm AB TAB SAB Ectopic Multiple Living   6 4 4  2  2  1 5      Review of Systems  HENT: Negative for  sinus pressure.   Eyes: Positive for photophobia. Negative for blurred vision.  Respiratory: Negative for cough.   Cardiovascular: Negative for near-syncope.  Gastrointestinal: Negative for nausea, vomiting, abdominal pain and diarrhea.  Musculoskeletal: Negative for myalgias, back pain and neck pain.  Neurological: Positive for headaches. Negative for focal weakness, seizures, numbness and loss of balance.  All other systems reviewed and are negative.     Allergies  Penicillins  Home Medications   Prior to Admission medications   Medication Sig Start Date End Date Taking? Authorizing Provider  oxyCODONE-acetaminophen (PERCOCET/ROXICET) 5-325 MG per tablet Take 1-2 tablets by mouth every 6 (six) hours as needed for pain. 11/07/12   Heather Laisure, PA-C   BP 113/70 mmHg  Pulse 86  Temp(Src) 98.3 F (36.8 C) (Oral)  Resp 16  Ht 5\' 8"  (1.727 m)  Wt 135 lb (61.236 kg)  BMI 20.53 kg/m2  SpO2 100% Physical Exam  Constitutional: She is oriented to person, place, and time. She appears well-developed and well-nourished. She appears distressed.  Crying on exam and wailing that her head hurts  HENT:  Head: Normocephalic and atraumatic.  Eyes: EOM are normal. Pupils are equal, round, and reactive to light. Right conjunctiva is injected. Left conjunctiva is injected.  Fundoscopic exam:      The right eye shows no papilledema.  The left eye shows no papilledema.  Neck: Normal range of motion. Neck supple.  Cardiovascular: Normal rate, regular rhythm, normal heart sounds and intact distal pulses.  Exam reveals no friction rub.   No murmur heard. Pulmonary/Chest: Effort normal and breath sounds normal. She has no wheezes. She has no rales.  Abdominal: Soft. Bowel sounds are normal. She exhibits no distension. There is no tenderness. There is no rebound and no guarding.  Musculoskeletal: Normal range of motion. She exhibits no tenderness.  No edema  Lymphadenopathy:    She has no  cervical adenopathy.  Neurological: She is alert and oriented to person, place, and time. She has normal strength. No cranial nerve deficit or sensory deficit.  Photophobia.  Gait not tested at this time  Skin: Skin is warm and dry. No rash noted.  Psychiatric: She has a normal mood and affect. Her behavior is normal.  Nursing note and vitals reviewed.   ED Course  Procedures (including critical care time) Labs Review Labs Reviewed - No data to display  Imaging Review Ct Head Wo Contrast  09/08/2014   CLINICAL DATA:  Bilateral temporal headache following alcohol consumption  EXAM: CT HEAD WITHOUT CONTRAST  TECHNIQUE: Contiguous axial images were obtained from the base of the skull through the vertex without intravenous contrast.  COMPARISON:  None.  FINDINGS: The ventricles are normal in size and configuration. There is no mass, hemorrhage, extra-axial fluid collection, or midline shift. Gray-white compartments are normal. No demonstrable acute infarct. Bony calvarium appears intact. The mastoid air cells are clear.  IMPRESSION: Study within normal limits.   Electronically Signed   By: Bretta BangWilliam  Woodruff M.D.   On: 09/08/2014 08:55     EKG Interpretation None      MDM   Final diagnoses:  Headache    Pt with headache concerning for SAH(sudden onset, worst of life) that started exactly less than 1 hour ago.  Hx of migraines but states this not a migraine.  She denies any neurologic complaints and does admit to drinking at least 6 beers last night.  No sx concerning for infection, or cavernous vein thrombosis.  Normal neuro exam and vital signs. Will give HA cocktail and head CT as sx have been <6hrs and feel that will adequately r/o SAH.  9:45 AM CT is neg.  On re-eval pt is currently sleeping and snoring.  Will recheck in 1hr  11:11 AM  Headache is much improved and pt d/ced home.  Gwyneth SproutWhitney Yvonna Brun, MD 09/08/14 [phone]

## 2014-09-08 NOTE — ED Notes (Signed)
Patient transported to CT 

## 2014-09-28 ENCOUNTER — Encounter (HOSPITAL_COMMUNITY): Payer: Self-pay

## 2014-09-28 ENCOUNTER — Emergency Department (HOSPITAL_COMMUNITY)
Admission: EM | Admit: 2014-09-28 | Discharge: 2014-09-28 | Disposition: A | Discharge status: Home / Self Care | Payer: Medicaid Other | Attending: Emergency Medicine | Admitting: Emergency Medicine

## 2014-09-28 DIAGNOSIS — Z880 Allergy status to penicillin: Secondary | ICD-10-CM | POA: Insufficient documentation

## 2014-09-28 DIAGNOSIS — F419 Anxiety disorder, unspecified: Secondary | ICD-10-CM | POA: Insufficient documentation

## 2014-09-28 DIAGNOSIS — H538 Other visual disturbances: Secondary | ICD-10-CM | POA: Insufficient documentation

## 2014-09-28 DIAGNOSIS — R51 Headache: Secondary | ICD-10-CM | POA: Insufficient documentation

## 2014-09-28 DIAGNOSIS — H11422 Conjunctival edema, left eye: Secondary | ICD-10-CM | POA: Insufficient documentation

## 2014-09-28 DIAGNOSIS — Z720 Tobacco use: Secondary | ICD-10-CM | POA: Insufficient documentation

## 2014-09-28 DIAGNOSIS — R519 Headache, unspecified: Secondary | ICD-10-CM

## 2014-09-28 DIAGNOSIS — H5712 Ocular pain, left eye: Secondary | ICD-10-CM | POA: Insufficient documentation

## 2014-09-28 MED ORDER — OXYCODONE-ACETAMINOPHEN 5-325 MG PO TABS
2.0000 | ORAL_TABLET | Freq: Once | ORAL | Status: AC
  Administered 2014-09-28: 2 via ORAL
  Filled 2014-09-28: qty 2

## 2014-09-28 MED ORDER — TETRACAINE HCL 0.5 % OP SOLN: 2.0000 [drp] | Freq: Once | OPHTHALMIC | Status: DC

## 2014-09-28 MED ORDER — TETRACAINE HCL 0.5 % OP SOLN
2.0000 [drp] | Freq: Once | OPHTHALMIC | Status: AC
  Administered 2014-09-28: 2 [drp] via OPHTHALMIC
  Filled 2014-09-28: qty 2

## 2014-09-28 MED ORDER — FLUORESCEIN SODIUM 1 MG OP STRP
1.0000 | ORAL_STRIP | Freq: Once | OPHTHALMIC | Status: AC
  Administered 2014-09-28: 1 via OPHTHALMIC
  Filled 2014-09-28: qty 1

## 2014-09-28 NOTE — ED Notes (Signed)
Pt states sudden onset left eye visual change at 1 am.  Pt started having eye pain 2 hours post.  Pt denies any eye trauma.  Denies congestion, sore throat or ear pain.  Pt eye is watering but no thick discharge.

## 2014-09-28 NOTE — ED Notes (Signed)
Pt refused to have eye patch placed per verbal order HepzibahO'Malley, [place].

## 2014-09-28 NOTE — ED Notes (Signed)
Dr. Rennis ChrisJacobowitz and Gershon Mussel'Malley, PA at bedside for eval, will medicate when providers are finished.

## 2014-09-28 NOTE — ED Notes (Signed)
Bed: WA20 Expected date:  Expected time:  Means of arrival:  Comments: 

## 2014-09-28 NOTE — ED Notes (Signed)
Bed: WA19 Expected date:  Expected time:  Means of arrival:  Comments: ems 

## 2014-09-28 NOTE — ED Notes (Addendum)
Initial Contact - pt up and pacing in room, difficult to assess 2/2 pain.  Per family, pt with onset L eye pain in the middle of the night, denies injury or other complaints.  Bilat eyes red and tearing.  Skin otherwise PWD.  Speaking full/clear sentences.  MAEI.  NAD.

## 2014-09-28 NOTE — ED Provider Notes (Signed)
Complains of blurred vision from left thigh which started 1 AM today while watching television. She developed pain in the left eye 2-3 hours later. She denies nausea or vomiting no other associated symptoms. She presently is complaining of some blurred vision in her right eye as well, though not as bad. No pain in right eye on exam alert Glasgow Coma Score 15 HEENT exam pupils equal round reactive to light. Conjunctival erythema bilaterally no fluorescein uptake on slit-lamp exam. Unable to obtain good Azucena Kubaeid with Tono-Pen due to poor cooperation on part of patient.  Doug SouSam Jaquana Geiger, MD 09/28/14 [phone]

## 2014-09-28 NOTE — ED Provider Notes (Signed)
CSN: [id number]     Arrival date & time 09/28/14  1122 History   First MD Initiated Contact with Patient 09/28/14 1251     Chief Complaint  Patient presents with  . Eye Pain  . Visual Field Change     (Consider location/radiation/quality/duration/timing/severity/associated sxs/prior Treatment) HPI Pt is a 38yo female presenting to ED with sudden onset of blurred vision and gradually worsening left eye pain and left sided frontal headache that started around 1AM this morning while pt was watching television. Denies hx of similar eye pain. Pain is a burning sensation, 10/10 at worst, worse in the light.  No pain medication PTA.  Pt does not wear contacts or glasses. Denies trauma to eye. No recent URI symptoms including no nasal congestion, sore throat or ear pain.  Pt does report hx of headaches including one on 09/08/14, seen in ED at that time, had a normal head CT.    Past Medical History  Diagnosis Date  . Complication of anesthesia     HR and B/P were elevated with epidural   Past Surgical History  Procedure Laterality Date  . Leg surgery    . Lipoma excision  2011  . Vaginal del x3    . Cesarean section     History reviewed. No pertinent family history. History  Substance Use Topics  . Smoking status: Current Every Day Smoker -- 0.50 packs/day    Types: Cigarettes    Last Attempt to Quit: 10/29/2012  . Smokeless tobacco: Never Used  . Alcohol Use: Yes   OB History    Gravida Para Term Preterm AB TAB SAB Ectopic Multiple Living   6 4 4  2  2  1 5      Review of Systems  Constitutional: Negative for fever and chills.  HENT: Negative for congestion, rhinorrhea, sinus pressure and sore throat.   Eyes: Positive for photophobia, pain, redness and visual disturbance. Negative for discharge and itching.  Respiratory: Negative for cough and shortness of breath.   Gastrointestinal: Positive for nausea. Negative for vomiting and diarrhea.  Neurological: Positive for headaches.  Negative for dizziness, tremors, seizures, syncope, facial asymmetry, speech difficulty, weakness, light-headedness and numbness.  All other systems reviewed and are negative.     Allergies  Penicillins  Home Medications   Prior to Admission medications   Medication Sig Start Date End Date Taking? Authorizing Provider  acetaminophen (TYLENOL) 500 MG tablet Take 1,000 mg by mouth every 6 (six) hours as needed for moderate pain or headache.   Yes Historical Provider, MD  oxyCODONE-acetaminophen (PERCOCET/ROXICET) 5-325 MG per tablet Take 1-2 tablets by mouth every 6 (six) hours as needed for pain. 11/07/12  Yes Heather Laisure, PA-C   BP 159/71 mmHg  Pulse 97  Temp(Src) 97.8 F (36.6 C) (Oral)  Resp 20  SpO2 99%  LMP 09/14/2014 Physical Exam  Constitutional: She appears well-developed and well-nourished. No distress.  HENT:  Head: Normocephalic and atraumatic.  Right Ear: Hearing, tympanic membrane, external ear and ear canal normal.  Left Ear: Hearing, tympanic membrane, external ear and ear canal normal.  Nose: Nose normal.  Mouth/Throat: Uvula is midline, oropharynx is clear and moist and mucous membranes are normal.  Eyes: EOM and lids are normal. Pupils are equal, round, and reactive to light. Lids are everted and swept, no foreign bodies found. Right eye exhibits no discharge and no hordeolum. No foreign body present in the right eye. Left eye exhibits chemosis. Left eye exhibits no discharge and no  hordeolum. No foreign body present in the left eye. Right conjunctiva is injected. Right conjunctiva has no hemorrhage. Left conjunctiva is injected. Left conjunctiva has no hemorrhage. No scleral icterus.  Slit lamp exam:      The right eye shows no corneal abrasion, no corneal ulcer, no foreign body and no fluorescein uptake.       The left eye shows no corneal abrasion, no corneal ulcer, no foreign body and no fluorescein uptake.  Visual Acuity:  Left: 20/200 Right:  20/40 Bilateral: unable to obtain as pt has difficulty keeping left eye open due to severe photophobia.   Pressure via tono-pen: unable to complete due to pt's anxiety, unable to cooperate for proper assessment  Neck: Normal range of motion.  Cardiovascular: Normal rate, regular rhythm and normal heart sounds.   Pulmonary/Chest: Effort normal and breath sounds normal. No respiratory distress. She has no wheezes. She has no rales. She exhibits no tenderness.  Abdominal: Soft. Bowel sounds are normal. She exhibits no distension and no mass. There is no tenderness. There is no rebound and no guarding.  Musculoskeletal: Normal range of motion.  Neurological: She is alert.  Skin: Skin is warm and dry. She is not diaphoretic.  Nursing note and vitals reviewed.   ED Course  Procedures (including critical care time) Labs Review Labs Reviewed - No data to display  Imaging Review No results found.   EKG Interpretation None      MDM   Final diagnoses:  Left eye pain  Blurred vision  Frontal headache    Pt is a 38yo female with c/o sudden onset of blurred vision with gradually worsening left eye pain and left frontal headache. Pt denies eye trauma. Does not wear contacts.  No recent cough, nasal congestion or ear pain.  Discussed pt with Dr. Ethelda ChickJacubowitz who also examined pt. Pt gradually worsening anxiety due to left eye pain.  Concern for closed angle glaucoma due or possible ophthalmic vein/artery occlusion due to sudden onset of eye pain and blurred vision, however, unable to obtain pressure due to lack of cooperation by pt due to anxiety and pain.    1:57PM Consulted with Dr. Karleen HampshireSpencer, ophthalmologist, agreed to see pt in his office as soon as she arrives from ED via POV as husband is able to transport pt.   Pt and husband verbalized understanding and agreement with tx plan. Pt provided with eye patch for comfort.   Junius Finnerrin O'Malley, PA-C 09/28/14 1636  Doug SouSam Jacubowitz, MD 09/28/14  [phone]

## 2015-04-08 ENCOUNTER — Emergency Department (HOSPITAL_COMMUNITY)
Admission: EM | Admit: 2015-04-08 | Discharge: 2015-04-08 | Disposition: A | Discharge status: Home / Self Care | Payer: Medicaid Other | Attending: Emergency Medicine | Admitting: Emergency Medicine

## 2015-04-08 ENCOUNTER — Emergency Department (HOSPITAL_COMMUNITY): Payer: Medicaid Other

## 2015-04-08 ENCOUNTER — Encounter (HOSPITAL_COMMUNITY): Payer: Self-pay | Admitting: Emergency Medicine

## 2015-04-08 DIAGNOSIS — S99921A Unspecified injury of right foot, initial encounter: Secondary | ICD-10-CM | POA: Insufficient documentation

## 2015-04-08 DIAGNOSIS — Z880 Allergy status to penicillin: Secondary | ICD-10-CM | POA: Insufficient documentation

## 2015-04-08 DIAGNOSIS — X58XXXA Exposure to other specified factors, initial encounter: Secondary | ICD-10-CM | POA: Insufficient documentation

## 2015-04-08 DIAGNOSIS — M79671 Pain in right foot: Secondary | ICD-10-CM

## 2015-04-08 DIAGNOSIS — Y9339 Activity, other involving climbing, rappelling and jumping off: Secondary | ICD-10-CM | POA: Insufficient documentation

## 2015-04-08 DIAGNOSIS — Y998 Other external cause status: Secondary | ICD-10-CM | POA: Insufficient documentation

## 2015-04-08 DIAGNOSIS — Y9289 Other specified places as the place of occurrence of the external cause: Secondary | ICD-10-CM | POA: Insufficient documentation

## 2015-04-08 DIAGNOSIS — Z720 Tobacco use: Secondary | ICD-10-CM | POA: Insufficient documentation

## 2015-04-08 MED ORDER — IBUPROFEN 600 MG PO TABS: 600.0000 mg | ORAL_TABLET | Freq: Four times a day (QID) | ORAL | Status: DC | PRN

## 2015-04-08 NOTE — ED Notes (Signed)
Pt transported to DG.  

## 2015-04-08 NOTE — ED Notes (Signed)
Pt complaint of right foot pain post nature walk 5/31. Pt reports worsening pain with movement since. Right pedal pulse present.

## 2015-04-08 NOTE — Progress Notes (Signed)
Orthopedic Tech Progress Note Patient Details:  Miranda Wood [birth year] [id number]  Ortho Devices Type of Ortho Device: CAM walker Ortho Device/Splint Interventions: Application   Cammer, Mickie Bail 04/08/2015, 2:19 PM

## 2015-04-08 NOTE — Discharge Instructions (Signed)
Please use Tylenol or ibuprofen as needed for pain, rest, ice, elevate. Please contact orthopedic specialist for further evaluation and management.

## 2015-04-08 NOTE — ED Provider Notes (Signed)
CSN: [id number]     Arrival date & time 04/08/15  1145 History   This chart was scribed for Eyvonne Mechanic, PA-C, working with Purvis Sheffield, MD by Octavia Heir, ED Scribe. This patient was seen in room WTR7/WTR7 and the patient's care was started at 12:17 PM.    Chief Complaint  Patient presents with  . Foot Pain      The history is provided by the patient. No language interpreter was used.    HPI Comments:  Miranda Wood is a 39 y.o. female who presents to the Emergency Department complaining of right foot pain. Pt reports 2 weeks ago she went on a nature walk, jumped down and injured her right foot. Report constant pain worse when she walks, reports her pain has gotten worse the last 2 days. She reports she has been able ambulate, but this is difficult.  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 Pt has associated burning and throbbing sensation on the bottom of her right foot. Pt has been taking OTC tylenol and ibuprofen to alleviate the pain with minimal relief. She denies any soft tissue injury, no signs of infection. No pain to ankle, knee or hip.  Past Medical History  Diagnosis Date  . Complication of anesthesia     HR and B/P were elevated with epidural   Past Surgical History  Procedure Laterality Date  . Leg surgery    . Lipoma excision  2011  . Vaginal del x3    . Cesarean section     No family history on file. History  Substance Use Topics  . Smoking status: Current Every Day Smoker -- 0.50 packs/day    Types: Cigarettes    Last Attempt to Quit: 10/29/2012  . Smokeless tobacco: Never Used  . Alcohol Use: Yes   OB History    Gravida Para Term Preterm AB TAB SAB Ectopic Multiple Living   6 4 4  2  2  1 5      Review of Systems  All other systems reviewed and are negative.   A complete 10 system review of systems was obtained and all systems are negative  except as noted in the HPI and PMH.    Allergies  Penicillins  Home Medications   Prior to Admission medications   Medication Sig Start Date End Date Taking? Authorizing Provider  acetaminophen (TYLENOL) 500 MG tablet Take 1,000 mg by mouth every 6 (six) hours as needed (For pain.).    Yes Historical Provider, MD  ibuprofen (ADVIL,MOTRIN) 600 MG tablet Take 1 tablet (600 mg total) by mouth every 6 (six) hours as needed. 04/08/15   Eyvonne Mechanic, PA-C   Triage vitals: BP 135/99 mmHg  Pulse 111  Temp(Src) 98.8 F (37.1 C) (Oral)  Resp 19  SpO2 98%  LMP 03/25/2015 (Exact Date) Physical Exam  Constitutional: She is oriented to person, place, and time. She appears well-developed and well-nourished. No distress.  HENT:  Head: Normocephalic and atraumatic.  Mouth/Throat: Oropharynx is clear and moist.  Eyes: Conjunctivae and EOM are normal. Pupils are equal, round, and reactive to light.  Neck: Normal range of motion. Neck supple.  Cardiovascular: Regular rhythm and normal heart sounds.   Pulmonary/Chest: Effort normal and breath sounds normal. No respiratory distress.  Musculoskeletal: Normal range of motion. She exhibits no edema.  Feet equal bilateral No signs of trauma  or injury swelling or edema Right midfoot Tenderness to palpation , ankle distal lower extremity non Tenderness to palpation, no tenderness to the fibular head, medial lateral malleolus, knee full active ROM strength 5 out of 5. No signs of edema to right extremity, no soft tissue infection, distal pulses intact  Neurological: She is alert and oriented to person, place, and time. No sensory deficit.  Skin: Skin is warm and dry.  Psychiatric: She has a normal mood and affect. Her behavior is normal.  Nursing note and vitals reviewed.   ED Course  Procedures (including critical care time) Labs Review Labs Reviewed - No data to display  Imaging Review Dg Foot Complete Right  04/08/2015   CLINICAL DATA:  Injured  foot jumping across a Creek bed today.  EXAM: RIGHT FOOT COMPLETE - 3+ VIEW  COMPARISON:  None.  FINDINGS: The joint spaces are maintained. Mild degenerative changes at the first metatarsal phalangeal joint and mild hallux valgus deformity. No acute fracture is identified. The tarsal metatarsal joints are normal. A small calcaneal heel spur is noted.  IMPRESSION: The no acute bony findings.   Electronically Signed   By: Rudie Meyer M.D.   On: 04/08/2015 13:21     EKG Interpretation None      MDM   Final diagnoses:  Right foot pain   Labs: none  Imaging: DG foot complete right- negative  Consults: none  Therapeutics: Cam walker  Assessment: Foot pain  Plan:  Patient presents with right foot pain for the past 2 weeks. Patient has been able to ambulate with some difficulty. Negative x-rays today, patient was placed in a cam walker which she reports reduced a great amount of pain with ambulation. This is likely soft tissue sprain, no signs of infection on the foot. Instructed use ibuprofen as needed for the pain, she was given orthopedic follow-up information in the event symptoms do not improve. Strict return precautions given, patient verbalized understanding and agreement to today's plan and had no further questions or concerns.   I personally performed the services described in this documentation, which was scribed in my presence. The recorded information has been reviewed and is accurate.  Eyvonne Mechanic, PA-C 04/08/15 1503  Purvis Sheffield, MD 04/08/15 [phone]

## 2015-04-08 NOTE — ED Notes (Signed)
Pt returned from DG; awaiting results. 

## 2017-03-25 ENCOUNTER — Encounter (HOSPITAL_BASED_OUTPATIENT_CLINIC_OR_DEPARTMENT_OTHER): Payer: Self-pay | Admitting: Emergency Medicine

## 2017-03-25 ENCOUNTER — Emergency Department (HOSPITAL_BASED_OUTPATIENT_CLINIC_OR_DEPARTMENT_OTHER): Payer: Self-pay

## 2017-03-25 ENCOUNTER — Emergency Department (HOSPITAL_BASED_OUTPATIENT_CLINIC_OR_DEPARTMENT_OTHER)
Admission: EM | Admit: 2017-03-25 | Discharge: 2017-03-25 | Disposition: A | Discharge status: Home / Self Care | Payer: Self-pay | Attending: Emergency Medicine | Admitting: Emergency Medicine

## 2017-03-25 DIAGNOSIS — S32049A Unspecified fracture of fourth lumbar vertebra, initial encounter for closed fracture: Secondary | ICD-10-CM | POA: Insufficient documentation

## 2017-03-25 DIAGNOSIS — Y929 Unspecified place or not applicable: Secondary | ICD-10-CM | POA: Insufficient documentation

## 2017-03-25 DIAGNOSIS — S32009A Unspecified fracture of unspecified lumbar vertebra, initial encounter for closed fracture: Secondary | ICD-10-CM

## 2017-03-25 DIAGNOSIS — Y939 Activity, unspecified: Secondary | ICD-10-CM | POA: Insufficient documentation

## 2017-03-25 DIAGNOSIS — M25552 Pain in left hip: Secondary | ICD-10-CM | POA: Insufficient documentation

## 2017-03-25 DIAGNOSIS — Y999 Unspecified external cause status: Secondary | ICD-10-CM | POA: Insufficient documentation

## 2017-03-25 DIAGNOSIS — W109XXA Fall (on) (from) unspecified stairs and steps, initial encounter: Secondary | ICD-10-CM | POA: Insufficient documentation

## 2017-03-25 DIAGNOSIS — F17210 Nicotine dependence, cigarettes, uncomplicated: Secondary | ICD-10-CM | POA: Insufficient documentation

## 2017-03-25 MED ORDER — KETOROLAC TROMETHAMINE 30 MG/ML IJ SOLN
30.0000 mg | Freq: Once | INTRAMUSCULAR | Status: DC
  Filled 2017-03-25: qty 1

## 2017-03-25 MED ORDER — OXYCODONE-ACETAMINOPHEN 5-325 MG PO TABS
1.0000 | ORAL_TABLET | Freq: Four times a day (QID) | ORAL | 0 refills | Status: DC | PRN
Start: 2017-03-25 — End: 2017-03-28

## 2017-03-25 MED ORDER — OXYCODONE-ACETAMINOPHEN 5-325 MG PO TABS
1.0000 | ORAL_TABLET | Freq: Once | ORAL | Status: AC
Start: 2017-03-25 — End: 2017-03-25
  Administered 2017-03-25: 1 via ORAL
  Filled 2017-03-25: qty 1

## 2017-03-25 NOTE — ED Notes (Signed)
Patient refusing IM medication.  Requesting PO medication.  Will notify MD

## 2017-03-25 NOTE — ED Triage Notes (Signed)
Pt reports falling yesterday going down the stairs. Co left lower back pain. With ROM pain radiates to lweft leg per pt. also sts feelings of tingling and numbness. Pt walked with difficulty to room.

## 2017-03-25 NOTE — ED Provider Notes (Signed)
[facility]   CSN: [id number] Arrival date & time: 03/25/17  1331     History   Chief Complaint Chief Complaint  Patient presents with  . Back Pain    left lower    HPI Miranda Wood is a 41 y.o. female.  HPI Patient presents with left-sided back pain after a fall. Reportedly was walking down wooden stairs early in the morning and fell landing on her left back/hip area. Severe pain since. States difficulty walking. No dysuria. States there is some tingling down left leg. No fevers. No loss of bladder bowel control. Denies possibility of pregnancy.   Past Medical History:  Diagnosis Date  . Complication of anesthesia    HR and B/P were elevated with epidural    Patient Active Problem List   Diagnosis Date Noted  . Cesarean delivery delivered 07/10/2011    Past Surgical History:  Procedure Laterality Date  . CESAREAN SECTION    . LEG SURGERY    . LIPOMA EXCISION  2011  . vaginal del x3      OB History    Gravida Para Term Preterm AB Living   6 4 4   2 5    SAB TAB Ectopic Multiple Live Births   2     1 2        Home Medications    Prior to Admission medications   Medication Sig Start Date End Date Taking? Authorizing Provider  acetaminophen (TYLENOL) 500 MG tablet Take 1,000 mg by mouth every 6 (six) hours as needed (For pain.).     [provider]  ibuprofen (ADVIL,MOTRIN) 600 MG tablet Take 1 tablet (600 mg total) by mouth every 6 (six) hours as needed. 04/08/15   Hedges, Tinnie Gens, PA-C  oxyCODONE-acetaminophen (PERCOCET/ROXICET) 5-325 MG tablet Take 1-2 tablets by mouth every 6 (six) hours as needed for severe pain. 03/25/17   Benjiman Core, MD    Family History No family history on file.  Social History Social History  Substance Use Topics  . Smoking status: Current Every Day Smoker    Packs/day: 0.50    Types: Cigarettes    Last attempt to quit: 10/29/2012  . Smokeless tobacco: Never Used  . Alcohol  use Yes     Allergies   Penicillins   Review of Systems Review of Systems  Constitutional: Negative for appetite change.  HENT: Negative for congestion.   Respiratory: Negative for shortness of breath.   Cardiovascular: Negative for chest pain.  Gastrointestinal: Negative for abdominal distention.  Genitourinary: Negative for flank pain.  Musculoskeletal: Positive for back pain. Negative for joint swelling.  Skin: Negative for pallor.  Neurological: Positive for numbness. Negative for seizures and light-headedness.  Hematological: Negative for adenopathy.  Psychiatric/Behavioral: Negative for confusion.     Physical Exam Updated Vital Signs BP 122/75   Pulse 74   Temp 98.4 F (36.9 C) (Oral)   Resp 16   LMP 03/24/2017   SpO2 100%   Physical Exam  Constitutional: She appears well-developed.  HENT:  Head: Normocephalic.  Neck: Neck supple.  Cardiovascular: Normal rate.   Pulmonary/Chest: Effort normal.  Musculoskeletal:  Patient with abrasion in the left posterior area near the posterior superior iliac spine. No tenderness over lumbar spine. No tenderness near the midline on the pelvis. There is tenderness however laterally on the pelvis. No tenderness over the hip. Neurovascular intact on the left foot, however Patient is laying on her front to the pain is difficult to get  a good exam.  Neurological:  Neurovascularly grossly intact over left foot.  Skin: Skin is warm. Capillary refill takes less than 2 seconds.     ED Treatments / Results  Labs (all labs ordered are listed, but only abnormal results are displayed) Labs Reviewed - No data to display  EKG  EKG Interpretation None       Radiology Dg Pelvis 1-2 Views  Result Date: 03/25/2017 CLINICAL DATA:  Left hip pain EXAM: PELVIS - 1-2 VIEW COMPARISON:  None. FINDINGS: There is no evidence of pelvic fracture or diastasis. No pelvic bone lesions are seen. IMPRESSION: Negative. Electronically Signed   By:  Signa Kellaylor  Stroud M.D.   On: 03/25/2017 14:56   Ct Pelvis Wo Contrast  Result Date: 03/25/2017 CLINICAL DATA:  Fall down steps, severe left hip pain EXAM: CT PELVIS WITHOUT CONTRAST TECHNIQUE: Multidetector CT imaging of the pelvis was performed following the standard protocol without intravenous contrast. COMPARISON:  Pelvic radiograph dated 03/25/2017 FINDINGS: Urinary Tract:  Bladder is within normal limits. Bowel:  Visualized bowel is unremarkable. Vascular/Lymphatic: No evidence of aneurysm. No suspicious pelvic lymphadenopathy. Reproductive:  Uterus and bilateral ovaries are unremarkable. Other:  No pelvic ascites. Mild subcutaneous stranding/ hematoma overlying the left gluteal region (series 9/ image 23). Musculoskeletal: Suspected nondisplaced fracture involving the left L4 transverse process (coronal image 50). Visualized bony pelvis and left hip remain intact. IMPRESSION: Suspected nondisplaced fracture involving the left L4 transverse process. Visualized bony pelvis and left hip remain intact. Mild subcutaneous stranding/hematoma overlying the left gluteal region. Electronically Signed   By: Charline BillsSriyesh  Krishnan M.D.   On: 03/25/2017 15:39    Procedures Procedures (including critical care time)  Medications Ordered in ED Medications  oxyCODONE-acetaminophen (PERCOCET/ROXICET) 5-325 MG per tablet 1 tablet (1 tablet Oral Given 03/25/17 1502)     Initial Impression / Assessment and Plan / ED Course  I have reviewed the triage vital signs and the nursing notes.  Pertinent labs & imaging results that were available during my care of the patient were reviewed by me and considered in my medical decision making (see chart for details).   issue with fall. No midline lumbar tenderness. Tenderness somewhat more laterally. CT of the pelvis did show left L4 transverse process fracture. This is likely the cause of her pain. No real weakness on the leg but does have some pain radiation down. Will have  follow-up with neurosurgery. Discharge home with pain medicines.  Final Clinical Impressions(s) / ED Diagnoses   Final diagnoses:  Lumbar transverse process fracture, closed, initial encounter (HCC)    New Prescriptions New Prescriptions   OXYCODONE-ACETAMINOPHEN (PERCOCET/ROXICET) 5-325 MG TABLET    Take 1-2 tablets by mouth every 6 (six) hours as needed for severe pain.     Benjiman CorePickering, Maxwell Martorano, MD 03/25/17 [phone]

## 2017-03-28 ENCOUNTER — Emergency Department (HOSPITAL_BASED_OUTPATIENT_CLINIC_OR_DEPARTMENT_OTHER)
Admission: EM | Admit: 2017-03-28 | Discharge: 2017-03-28 | Disposition: A | Discharge status: Home / Self Care | Payer: Self-pay | Attending: Emergency Medicine | Admitting: Emergency Medicine

## 2017-03-28 ENCOUNTER — Emergency Department (HOSPITAL_COMMUNITY): Payer: Self-pay

## 2017-03-28 ENCOUNTER — Encounter (HOSPITAL_BASED_OUTPATIENT_CLINIC_OR_DEPARTMENT_OTHER): Payer: Self-pay | Admitting: *Deleted

## 2017-03-28 DIAGNOSIS — S32048D Other fracture of fourth lumbar vertebra, subsequent encounter for fracture with routine healing: Secondary | ICD-10-CM | POA: Insufficient documentation

## 2017-03-28 DIAGNOSIS — M545 Low back pain, unspecified: Secondary | ICD-10-CM

## 2017-03-28 DIAGNOSIS — Z79899 Other long term (current) drug therapy: Secondary | ICD-10-CM | POA: Insufficient documentation

## 2017-03-28 DIAGNOSIS — W19XXXD Unspecified fall, subsequent encounter: Secondary | ICD-10-CM | POA: Insufficient documentation

## 2017-03-28 DIAGNOSIS — S32009D Unspecified fracture of unspecified lumbar vertebra, subsequent encounter for fracture with routine healing: Secondary | ICD-10-CM

## 2017-03-28 DIAGNOSIS — R202 Paresthesia of skin: Secondary | ICD-10-CM | POA: Insufficient documentation

## 2017-03-28 DIAGNOSIS — M5442 Lumbago with sciatica, left side: Secondary | ICD-10-CM | POA: Insufficient documentation

## 2017-03-28 DIAGNOSIS — R200 Anesthesia of skin: Secondary | ICD-10-CM

## 2017-03-28 DIAGNOSIS — F17210 Nicotine dependence, cigarettes, uncomplicated: Secondary | ICD-10-CM | POA: Insufficient documentation

## 2017-03-28 MED ORDER — METHOCARBAMOL 1000 MG/10ML IJ SOLN: 1000.0000 mg | Freq: Once | INTRAMUSCULAR | Status: DC

## 2017-03-28 MED ORDER — HYDROMORPHONE HCL 1 MG/ML IJ SOLN
1.0000 mg | Freq: Once | INTRAMUSCULAR | Status: AC
  Administered 2017-03-28: 1 mg via INTRAVENOUS
  Filled 2017-03-28: qty 1

## 2017-03-28 MED ORDER — METHOCARBAMOL 1000 MG/10ML IJ SOLN
1000.0000 mg | Freq: Once | INTRAVENOUS | Status: AC
  Administered 2017-03-28: 1000 mg via INTRAVENOUS
  Filled 2017-03-28: qty 10

## 2017-03-28 MED ORDER — DEXAMETHASONE SODIUM PHOSPHATE 10 MG/ML IJ SOLN
10.0000 mg | Freq: Once | INTRAMUSCULAR | Status: AC
  Administered 2017-03-28: 10 mg via INTRAVENOUS
  Filled 2017-03-28: qty 1

## 2017-03-28 MED ORDER — METHOCARBAMOL 500 MG PO TABS: 500.0000 mg | ORAL_TABLET | Freq: Two times a day (BID) | ORAL | 0 refills | Status: DC

## 2017-03-28 MED ORDER — MORPHINE SULFATE (PF) 4 MG/ML IV SOLN
4.0000 mg | Freq: Once | INTRAVENOUS | Status: AC
  Administered 2017-03-28: 4 mg via INTRAVENOUS
  Filled 2017-03-28: qty 1

## 2017-03-28 MED ORDER — OXYCODONE-ACETAMINOPHEN 5-325 MG PO TABS
2.0000 | ORAL_TABLET | Freq: Once | ORAL | Status: AC
  Administered 2017-03-28: 2 via ORAL
  Filled 2017-03-28: qty 2

## 2017-03-28 MED ORDER — OXYCODONE-ACETAMINOPHEN 5-325 MG PO TABS
1.0000 | ORAL_TABLET | Freq: Four times a day (QID) | ORAL | 0 refills | Status: DC | PRN
Start: 2017-03-28 — End: 2017-04-16

## 2017-03-28 MED ORDER — LORAZEPAM 2 MG/ML IJ SOLN
1.0000 mg | Freq: Once | INTRAMUSCULAR | Status: AC
  Administered 2017-03-28: 1 mg via INTRAVENOUS
  Filled 2017-03-28: qty 1

## 2017-03-28 NOTE — ED Provider Notes (Signed)
[facility]   CSN: [id number] Arrival date & time: 03/28/17  1337     History   Chief Complaint Chief Complaint  Patient presents with  . Back Pain    HPI Miranda Wood is a 41 y.o. female.  The history is provided by the patient and medical records.  Back Pain   This is a new problem. The current episode started more than 2 days ago. The problem occurs constantly. The problem has been gradually worsening. The pain is associated with falling. The pain is present in the lumbar spine. The quality of the pain is described as shooting. The pain radiates to the left foot, left knee and left thigh. The pain is at a severity of 10/10. The pain is severe. The symptoms are aggravated by bending, twisting and certain positions. The pain is the same all the time. Associated symptoms include numbness, leg pain, paresthesias and tingling. Pertinent negatives include no chest pain, no fever, no headaches, no abdominal pain, no bowel incontinence, no perianal numbness, no bladder incontinence, no dysuria, no pelvic pain and no weakness. She has tried nothing for the symptoms. The treatment provided no relief.    Past Medical History:  Diagnosis Date  . Complication of anesthesia    HR and B/P were elevated with epidural    Patient Active Problem List   Diagnosis Date Noted  . Cesarean delivery delivered 07/10/2011    Past Surgical History:  Procedure Laterality Date  . CESAREAN SECTION    . LEG SURGERY    . LIPOMA EXCISION  2011  . vaginal del x3      OB History    Gravida Para Term Preterm AB Living   6 4 4   2 5    SAB TAB Ectopic Multiple Live Births   2     1 2        Home Medications    Prior to Admission medications   Medication Sig Start Date End Date Taking? Authorizing Provider  acetaminophen (TYLENOL) 500 MG tablet Take 1,000 mg by mouth every 6 (six) hours as needed (For pain.).     [provider]  ibuprofen  (ADVIL,MOTRIN) 600 MG tablet Take 1 tablet (600 mg total) by mouth every 6 (six) hours as needed. 04/08/15   Hedges, Tinnie GensJeffrey, PA-C  oxyCODONE-acetaminophen (PERCOCET/ROXICET) 5-325 MG tablet Take 1-2 tablets by mouth every 6 (six) hours as needed for severe pain. 03/25/17   Benjiman CorePickering, Nathan, MD    Family History No family history on file.  Social History Social History  Substance Use Topics  . Smoking status: Current Every Day Smoker    Packs/day: 0.50    Types: Cigarettes    Last attempt to quit: 10/29/2012  . Smokeless tobacco: Never Used  . Alcohol use Yes     Allergies   Penicillins   Review of Systems Review of Systems  Constitutional: Negative for activity change, chills, diaphoresis, fatigue and fever.  HENT: Negative for congestion and rhinorrhea.   Eyes: Negative for visual disturbance.  Respiratory: Negative for cough, chest tightness, shortness of breath and stridor.   Cardiovascular: Negative for chest pain, palpitations and leg swelling.  Gastrointestinal: Negative for abdominal distention, abdominal pain, bowel incontinence, constipation, diarrhea, nausea and vomiting.  Genitourinary: Negative for bladder incontinence, difficulty urinating, dysuria, flank pain, frequency, hematuria, menstrual problem, pelvic pain, vaginal bleeding and vaginal discharge.  Musculoskeletal: Positive for back pain. Negative for neck pain and neck stiffness.  Skin: Positive for wound.  Negative for rash.  Neurological: Positive for tingling, numbness and paresthesias. Negative for dizziness, weakness, light-headedness and headaches.  Psychiatric/Behavioral: Negative for agitation and confusion.  All other systems reviewed and are negative.    Physical Exam Updated Vital Signs BP (!) 121/91   Pulse (!) 104   Temp 98.2 F (36.8 C) (Oral)   Resp 18   Ht 5\' 8"  (1.727 m)   Wt 63.5 kg (140 lb)   LMP 03/24/2017   SpO2 100%   BMI 21.29 kg/m   Physical Exam  Constitutional: She is  oriented to person, place, and time. She appears well-developed and well-nourished. No distress.  HENT:  Head: Normocephalic and atraumatic.  Mouth/Throat: Oropharynx is clear and moist. No oropharyngeal exudate.  Eyes: Conjunctivae and EOM are normal. Pupils are equal, round, and reactive to light.  Neck: Normal range of motion. Neck supple.  Cardiovascular: Normal rate, regular rhythm and intact distal pulses.   No murmur heard. Pulmonary/Chest: Effort normal and breath sounds normal. No stridor. No respiratory distress. She has no wheezes. She exhibits no tenderness.  Abdominal: Soft. There is no tenderness.  Musculoskeletal: She exhibits tenderness. She exhibits no edema.       Back:  Neurological: She is alert and oriented to person, place, and time. She is not disoriented. She displays normal reflexes. A sensory deficit is present. No cranial nerve deficit. She exhibits normal muscle tone. Coordination normal. GCS eye subscore is 4. GCS verbal subscore is 5. GCS motor subscore is 6.  Numbness in left leg. Normal pulses. Normal reflexes. No clonus. Normal pulses in lower extremities.  Skin: Skin is warm and dry. Capillary refill takes less than 2 seconds. No rash noted. No erythema.  Psychiatric: She has a normal mood and affect.  Nursing note and vitals reviewed.    ED Treatments / Results  Labs (all labs ordered are listed, but only abnormal results are displayed) Labs Reviewed - No data to display  EKG  EKG Interpretation None       Radiology No results found.  Procedures Procedures (including critical care time)  Medications Ordered in ED Medications  HYDROmorphone (DILAUDID) injection 1 mg (not administered)     Initial Impression / Assessment and Plan / ED Course  I have reviewed the triage vital signs and the nursing notes.  Pertinent labs & imaging results that were available during my care of the patient were reviewed by me and considered in my medical  decision making (see chart for details).     Miranda Wood is a 41 y.o. female with a past medical history significant for recent diagnosis of left L3 transverse process fracture from a fall 3 days ago who presents with worsened back pain, decreased ambulation, and numbness in her left leg. Patient reports that she had a mechanical fall 3 days ago and was diagnosed with a L4 transverse process fracture at that time. She reports that she was given a prescription for several doses of oxycodone pain medication which she used and helped minimally. She says that she is now out of that medication however, she also says that today she developed new numbness in the leg. She says the numbness goes from her back down to her toes. She says this is different than the pain she is having the past. She denies overt weakness but says she has had difficulty walking due to the pain and the numbness. She denies any recurrent falls or new injuries. She denies any headache, vision changes,  chest pain, shortness of breath, nausea, vomiting and she denies any other back pains. She denies any loss of bowel or bladder function.  History and exam are seen above. On exam, patient has severe tenderness in her mid and left low back. Patient has an abrasion that is healing with no evidence of fluctuance or purulence in the left low back. Patient had normal plantar flexion and dorsiflexion strength. Movement of the left leg caused severe pain. Patient had decreased sensation on the left compared to right. This was consistent for the entire left leg. She had no abdominal tenderness or chest tenderness. Lungs clear.  Patient was given pain medicine for her symptoms.  Due to the worsened pain, and new neurologic deficit of numbness that is worsening from her recent lumbar injury, patient will need MRI imaging to rule out cord injury or neural compression. Doubt cauda equina at this time due to lack of bowel or bladder  incontinence.  Patient will be transferred to [facility] for MRI. MRI is reassuring, suspect patient will be stable for discharge home with prescription of pain medication and neurosurgery follow-up. Patient agrees with plan of transfer for MRI.    Final Clinical Impressions(s) / ED Diagnoses   Final diagnoses:  Acute left-sided low back pain with left-sided sciatica  Left leg numbness  Low back pain  Closed fracture of transverse process of lumbar vertebra with routine healing, subsequent encounter    Clinical Impression: 1. Acute left-sided low back pain with left-sided sciatica   2. Left leg numbness   3. Low back pain   4. Closed fracture of transverse process of lumbar vertebra with routine healing, subsequent encounter     Disposition: Transferred to [facility] for MRI    Yao Hyppolite, Canary Brim, MD 03/29/17 1335

## 2017-03-28 NOTE — ED Notes (Signed)
Patient transported to MRI 

## 2017-03-28 NOTE — ED Notes (Signed)
Pt verbalized understanding discharge instructions and denies any further needs or questions at this time. VS stable, ambulatory and steady gait.   

## 2017-03-28 NOTE — ED Notes (Signed)
Ortho called, TLSO ordered.

## 2017-03-28 NOTE — ED Provider Notes (Signed)
MRI as below.  No cord compression.  TLSO for comfort.  F/u with neurosurgery and PCP.  Will treat pain with percocet.  Neurovascularly intact.   Roxy HorsemanBrowning, Brnadon Eoff, PA-C 03/28/17 2255    Raeford RazorKohut, Stephen, MD 03/31/17 2009

## 2017-03-28 NOTE — ED Provider Notes (Signed)
Care [facility], please see prior provider's notes for full documentation of patient's complaint/HPI. Briefly, pt here with worsening low back pain and L leg numbness after fall on 03/25/17; was seen in the ED that day and had CT pelvis which revealed suspected nondisplaced fx of L L4 transverse process. Plan on transfer was to obtain lumbar MRI to eval for cord compression or neurosurgical emergency findings. MRI ordered by prior provider. She was given 1mg  dilaudid and 4mg  morphine PTA and that helped.  Physical Exam  BP 117/88 (BP Location: Left Leg)   Pulse 75   Temp 98 F (36.7 C) (Oral)   Resp 16   Ht 5\' 8"  (1.727 m)   Wt 63.5 kg (140 lb)   LMP 03/24/2017   SpO2 100%   BMI 21.29 kg/m   Physical Exam Gen: afebrile, VSS, NAD HEENT: EOMI, MMM Resp: no resp distress CV: rate WNL Abd: appearance normal, nondistended MsK: moving all extremities although not wanting to use her L leg; laying on her R side. Thoracic and lumbar spine without focal midline spinous process TTP (although already given pain meds), no bony stepoffs or deformities, with diffuse L sided paraspinous muscle TTP and a few palpable muscle spasms particularly on the lumbar region. Small bruise and healing abrasion to L lumbar region/posterior hip. Dorsiflexion/plantarflexion grossly intact bilaterally, and sensation grossly intact in all extremities. Distal pulses intact.  Neuro: A&O x4   ED Course  Procedures  Dg Pelvis 1-2 Views  Result Date: 03/25/2017 CLINICAL DATA:  Left hip pain EXAM: PELVIS - 1-2 VIEW COMPARISON:  None. FINDINGS: There is no evidence of pelvic fracture or diastasis. No pelvic bone lesions are seen. IMPRESSION: Negative. Electronically Signed   By: Signa Kellaylor  Stroud M.D.   On: 03/25/2017 14:56   Ct Pelvis Wo Contrast  Result Date: 03/25/2017 CLINICAL DATA:  Fall down steps, severe left hip pain EXAM: CT PELVIS WITHOUT CONTRAST TECHNIQUE: Multidetector CT imaging of the pelvis was  performed following the standard protocol without intravenous contrast. COMPARISON:  Pelvic radiograph dated 03/25/2017 FINDINGS: Urinary Tract:  Bladder is within normal limits. Bowel:  Visualized bowel is unremarkable. Vascular/Lymphatic: No evidence of aneurysm. No suspicious pelvic lymphadenopathy. Reproductive:  Uterus and bilateral ovaries are unremarkable. Other:  No pelvic ascites. Mild subcutaneous stranding/ hematoma overlying the left gluteal region (series 9/ image 23). Musculoskeletal: Suspected nondisplaced fracture involving the left L4 transverse process (coronal image 50). Visualized bony pelvis and left hip remain intact. IMPRESSION: Suspected nondisplaced fracture involving the left L4 transverse process. Visualized bony pelvis and left hip remain intact. Mild subcutaneous stranding/hematoma overlying the left gluteal region. Electronically Signed   By: Charline BillsSriyesh  Krishnan M.D.   On: 03/25/2017 15:39     Meds ordered this encounter  Medications  . HYDROmorphone (DILAUDID) injection 1 mg  . morphine 4 MG/ML injection 4 mg  . DISCONTD: methocarbamol (ROBAXIN) injection 1,000 mg  . dexamethasone (DECADRON) injection 10 mg  . methocarbamol (ROBAXIN) 1,000 mg in dextrose 5 % 50 mL IVPB  . LORazepam (ATIVAN) injection 1 mg     MDM:   ICD-9-CM ICD-10-CM   1. Acute left-sided low back pain with left-sided sciatica 724.2 M54.42    724.3    2. Left leg numbness 782.0 R20.0 MR LUMBAR SPINE WO CONTRAST     MR LUMBAR SPINE WO CONTRAST  3. Low back pain 724.2 M54.5 MR LUMBAR SPINE WO CONTRAST     MR LUMBAR SPINE WO CONTRAST    6:50  PM- pt just arriving. MRI ordered. Pain controlled for the most part, but hurts when she moves. Will give decadron and robaxin to help with pain control. Will reassess after MRI results return.   7:54 PM- pt just now going to MRI; didn't really get most of the robaxin; needs something for claustrophobia therefore will give ativan. Patient care to be resumed  by Ivar Drape, PA-C at shift change sign-out. Patient history has been discussed with midlevel resuming care. Please see their notes for further documentation of pending results and dispo/care. Pt stable at sign-out transfer of care, however not updated due to not being in the department at this time.     [address] [place], [place] 03/28/17 1955    Raeford Razor, MD 03/31/17 2009

## 2017-03-28 NOTE — ED Triage Notes (Signed)
Lower back pain. States she had a fall 3 days ago and was diagnosed with a fracture.

## 2017-03-28 NOTE — ED Notes (Signed)
Given po fluids, watching TV
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
## 2017-04-16 ENCOUNTER — Emergency Department (HOSPITAL_COMMUNITY)
Admission: EM | Admit: 2017-04-16 | Discharge: 2017-04-16 | Disposition: A | Discharge status: Home / Self Care | Payer: Self-pay | Attending: Emergency Medicine | Admitting: Emergency Medicine

## 2017-04-16 ENCOUNTER — Emergency Department (HOSPITAL_COMMUNITY): Payer: Self-pay

## 2017-04-16 ENCOUNTER — Encounter (HOSPITAL_COMMUNITY): Payer: Self-pay | Admitting: Emergency Medicine

## 2017-04-16 DIAGNOSIS — Y929 Unspecified place or not applicable: Secondary | ICD-10-CM | POA: Insufficient documentation

## 2017-04-16 DIAGNOSIS — S0011XA Contusion of right eyelid and periocular area, initial encounter: Secondary | ICD-10-CM | POA: Insufficient documentation

## 2017-04-16 DIAGNOSIS — S32009A Unspecified fracture of unspecified lumbar vertebra, initial encounter for closed fracture: Secondary | ICD-10-CM

## 2017-04-16 DIAGNOSIS — T148XXA Other injury of unspecified body region, initial encounter: Secondary | ICD-10-CM

## 2017-04-16 DIAGNOSIS — S0990XA Unspecified injury of head, initial encounter: Secondary | ICD-10-CM | POA: Insufficient documentation

## 2017-04-16 DIAGNOSIS — S0012XA Contusion of left eyelid and periocular area, initial encounter: Secondary | ICD-10-CM | POA: Insufficient documentation

## 2017-04-16 DIAGNOSIS — Y999 Unspecified external cause status: Secondary | ICD-10-CM | POA: Insufficient documentation

## 2017-04-16 DIAGNOSIS — S0081XA Abrasion of other part of head, initial encounter: Secondary | ICD-10-CM | POA: Insufficient documentation

## 2017-04-16 DIAGNOSIS — S32049A Unspecified fracture of fourth lumbar vertebra, initial encounter for closed fracture: Secondary | ICD-10-CM | POA: Insufficient documentation

## 2017-04-16 DIAGNOSIS — S32029A Unspecified fracture of second lumbar vertebra, initial encounter for closed fracture: Secondary | ICD-10-CM | POA: Insufficient documentation

## 2017-04-16 DIAGNOSIS — Y939 Activity, unspecified: Secondary | ICD-10-CM | POA: Insufficient documentation

## 2017-04-16 DIAGNOSIS — S32039A Unspecified fracture of third lumbar vertebra, initial encounter for closed fracture: Secondary | ICD-10-CM | POA: Insufficient documentation

## 2017-04-16 DIAGNOSIS — F17210 Nicotine dependence, cigarettes, uncomplicated: Secondary | ICD-10-CM | POA: Insufficient documentation

## 2017-04-16 MED ORDER — IBUPROFEN 800 MG PO TABS
800.0000 mg | ORAL_TABLET | Freq: Once | ORAL | Status: AC
  Administered 2017-04-16: 800 mg via ORAL
  Filled 2017-04-16: qty 1

## 2017-04-16 MED ORDER — OXYCODONE-ACETAMINOPHEN 5-325 MG PO TABS
1.0000 | ORAL_TABLET | Freq: Once | ORAL | Status: AC
  Administered 2017-04-16: 1 via ORAL
  Filled 2017-04-16: qty 1

## 2017-04-16 MED ORDER — HYDROCODONE-ACETAMINOPHEN 5-325 MG PO TABS: 1.0000 | ORAL_TABLET | Freq: Four times a day (QID) | ORAL | 0 refills | Status: DC | PRN

## 2017-04-16 MED ORDER — DIAZEPAM 5 MG PO TABS
5.0000 mg | ORAL_TABLET | Freq: Once | ORAL | Status: AC
  Administered 2017-04-16: 5 mg via ORAL
  Filled 2017-04-16: qty 1

## 2017-04-16 MED ORDER — IBUPROFEN 800 MG PO TABS: 800.0000 mg | ORAL_TABLET | Freq: Three times a day (TID) | ORAL | 0 refills | Status: DC | PRN

## 2017-04-16 NOTE — Discharge Instructions (Signed)
It was my pleasure taking care of you today! I hope you feel better soon.   Ice affected area for pain / swelling relief.   Ibuprofen as needed for mild to moderate pain. Pain medication only as needed for severe pain - This can make you very drowsy - please do not drink alcohol, operate heavy machinery or drive on this medication.   Follow up with the neurosurgeon for your back pain.   Return to ER for new or worsening symptoms, any additional concerns.
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
## 2017-12-16 ENCOUNTER — Encounter (HOSPITAL_COMMUNITY): Payer: Self-pay | Admitting: Emergency Medicine

## 2017-12-16 ENCOUNTER — Emergency Department (HOSPITAL_COMMUNITY)
Admission: EM | Admit: 2017-12-16 | Discharge: 2017-12-16 | Disposition: A | Discharge status: Home / Self Care | Payer: Self-pay | Attending: Emergency Medicine | Admitting: Emergency Medicine

## 2017-12-16 DIAGNOSIS — R109 Unspecified abdominal pain: Secondary | ICD-10-CM | POA: Insufficient documentation

## 2017-12-16 DIAGNOSIS — Z5321 Procedure and treatment not carried out due to patient leaving prior to being seen by health care provider: Secondary | ICD-10-CM | POA: Insufficient documentation

## 2017-12-16 HISTORY — DX: Encounter for other specified aftercare: Z51.89

## 2017-12-16 LAB — COMPREHENSIVE METABOLIC PANEL
ALBUMIN: 4.2 g/dL (ref 3.5–5.0)
ALK PHOS: 63 U/L (ref 38–126)
ALT: 106 U/L — AB (ref 14–54)
ANION GAP: 15 (ref 5–15)
AST: 245 U/L — AB (ref 15–41)
BILIRUBIN TOTAL: 0.3 mg/dL (ref 0.3–1.2)
CALCIUM: 8.9 mg/dL (ref 8.9–10.3)
CO2: 24 mmol/L (ref 22–32)
CREATININE: 0.61 mg/dL (ref 0.44–1.00)
Chloride: 103 mmol/L (ref 101–111)
GFR calc Af Amer: >60 mL/min (ref >60)
GFR calc non Af Amer: >60 mL/min (ref >60)
GLUCOSE: 138 mg/dL — AB (ref 65–99)
Potassium: 3.7 mmol/L (ref 3.5–5.1)
SODIUM: 142 mmol/L (ref 135–145)
TOTAL PROTEIN: 7.7 g/dL (ref 6.5–8.1)

## 2017-12-16 LAB — I-STAT BETA HCG BLOOD, ED (MC, WL, AP ONLY)

## 2017-12-16 LAB — CBC
HCT: 42.4 % (ref 36.0–46.0)
Hemoglobin: 14.5 g/dL (ref 12.0–15.0)
MCH: 34.6 pg — AB (ref 26.0–34.0)
MCHC: 34.2 g/dL (ref 30.0–36.0)
MCV: 101.2 fL — ABNORMAL HIGH (ref 78.0–100.0)
PLATELETS: 245 10*3/uL (ref 150–400)
RBC: 4.19 MIL/uL (ref 3.87–5.11)
RDW: 13.1 % (ref 11.5–15.5)
WBC: 4.5 10*3/uL (ref 4.0–10.5)

## 2017-12-16 LAB — LIPASE, BLOOD: Lipase: 74 U/L — ABNORMAL HIGH (ref 11–51)

## 2017-12-16 NOTE — ED Triage Notes (Signed)
Patient BIB daughter, reports patient drinks daily. Patient reports she has been vomiting daily with abdominal pain and reports two seizures last week. Reports last drink this morning.

## 2017-12-16 NOTE — ED Notes (Signed)
Pt called from triage with no answer 

## 2018-03-22 ENCOUNTER — Encounter (HOSPITAL_COMMUNITY): Payer: Self-pay | Admitting: Emergency Medicine

## 2018-03-22 ENCOUNTER — Emergency Department (HOSPITAL_COMMUNITY)
Admission: EM | Admit: 2018-03-22 | Discharge: 2018-03-23 | Disposition: A | Discharge status: Home / Self Care | Payer: Self-pay | Attending: Emergency Medicine | Admitting: Emergency Medicine

## 2018-03-22 DIAGNOSIS — F419 Anxiety disorder, unspecified: Secondary | ICD-10-CM | POA: Insufficient documentation

## 2018-03-22 DIAGNOSIS — R251 Tremor, unspecified: Secondary | ICD-10-CM | POA: Insufficient documentation

## 2018-03-22 DIAGNOSIS — Z046 Encounter for general psychiatric examination, requested by authority: Secondary | ICD-10-CM | POA: Insufficient documentation

## 2018-03-22 DIAGNOSIS — F10920 Alcohol use, unspecified with intoxication, uncomplicated: Secondary | ICD-10-CM

## 2018-03-22 DIAGNOSIS — F1020 Alcohol dependence, uncomplicated: Secondary | ICD-10-CM | POA: Insufficient documentation

## 2018-03-22 DIAGNOSIS — F17210 Nicotine dependence, cigarettes, uncomplicated: Secondary | ICD-10-CM | POA: Insufficient documentation

## 2018-03-22 LAB — COMPREHENSIVE METABOLIC PANEL
ALT: 56 U/L — AB (ref 14–54)
ANION GAP: 15 (ref 5–15)
AST: 127 U/L — ABNORMAL HIGH (ref 15–41)
Albumin: 4.1 g/dL (ref 3.5–5.0)
Alkaline Phosphatase: 57 U/L (ref 38–126)
BUN: <5 mg/dL — ABNORMAL LOW (ref 6–20)
CHLORIDE: 104 mmol/L (ref 101–111)
CO2: 20 mmol/L — AB (ref 22–32)
CREATININE: 0.67 mg/dL (ref 0.44–1.00)
Calcium: 8.9 mg/dL (ref 8.9–10.3)
Glucose, Bld: 126 mg/dL — ABNORMAL HIGH (ref 65–99)
POTASSIUM: 3.9 mmol/L (ref 3.5–5.1)
SODIUM: 139 mmol/L (ref 135–145)
Total Bilirubin: 0.7 mg/dL (ref 0.3–1.2)
Total Protein: 7.9 g/dL (ref 6.5–8.1)

## 2018-03-22 LAB — CBC
HEMATOCRIT: 43.9 % (ref 36.0–46.0)
HEMOGLOBIN: 14.7 g/dL (ref 12.0–15.0)
MCH: 35.2 pg — ABNORMAL HIGH (ref 26.0–34.0)
MCHC: 33.5 g/dL (ref 30.0–36.0)
MCV: 105 fL — AB (ref 78.0–100.0)
Platelets: 316 10*3/uL (ref 150–400)
RBC: 4.18 MIL/uL (ref 3.87–5.11)
RDW: 12.6 % (ref 11.5–15.5)
WBC: 6.1 10*3/uL (ref 4.0–10.5)

## 2018-03-22 LAB — RAPID URINE DRUG SCREEN, HOSP PERFORMED
AMPHETAMINES: NOT DETECTED
BARBITURATES: NOT DETECTED
BENZODIAZEPINES: NOT DETECTED
COCAINE: NOT DETECTED
Opiates: NOT DETECTED
TETRAHYDROCANNABINOL: NOT DETECTED

## 2018-03-22 LAB — I-STAT BETA HCG BLOOD, ED (MC, WL, AP ONLY)

## 2018-03-22 LAB — ETHANOL: Alcohol, Ethyl (B): 451 mg/dL (ref <10)

## 2018-03-22 MED ORDER — SODIUM CHLORIDE 0.9 % IV BOLUS
1000.0000 mL | Freq: Once | INTRAVENOUS | Status: AC
  Administered 2018-03-22: 1000 mL via INTRAVENOUS

## 2018-03-22 NOTE — ED Triage Notes (Signed)
Brought by neighbor for detox.  Reports they tried to get her in a facility for rehab but was told she needed medical clearance first.  Last drink 20 min pta.

## 2018-03-22 NOTE — ED Notes (Signed)
Pt unable to void at this time. 

## 2018-03-23 ENCOUNTER — Other Ambulatory Visit: Payer: Self-pay

## 2018-03-23 MED ORDER — LORAZEPAM 2 MG/ML IJ SOLN
0.0000 mg | Freq: Four times a day (QID) | INTRAMUSCULAR | Status: DC
  Administered 2018-03-23: 2 mg via INTRAVENOUS
  Filled 2018-03-23: qty 1

## 2018-03-23 MED ORDER — LORAZEPAM 1 MG PO TABS
2.0000 mg | ORAL_TABLET | Freq: Once | ORAL | Status: AC
  Administered 2018-03-23: 2 mg via ORAL
  Filled 2018-03-23: qty 2

## 2018-03-23 MED ORDER — VITAMIN B-1 100 MG PO TABS
100.0000 mg | ORAL_TABLET | Freq: Every day | ORAL | Status: DC
  Administered 2018-03-23: 100 mg via ORAL
  Filled 2018-03-23: qty 1

## 2018-03-23 MED ORDER — THIAMINE HCL 100 MG/ML IJ SOLN: 100.0000 mg | Freq: Every day | INTRAMUSCULAR | Status: DC

## 2018-03-23 MED ORDER — LORAZEPAM 2 MG/ML IJ SOLN: 0.0000 mg | Freq: Two times a day (BID) | INTRAMUSCULAR | Status: DC

## 2018-03-23 MED ORDER — CHLORDIAZEPOXIDE HCL 25 MG PO CAPS: ORAL_CAPSULE | ORAL | 0 refills | Status: DC

## 2018-03-23 MED ORDER — LORAZEPAM 1 MG PO TABS
0.0000 mg | ORAL_TABLET | Freq: Four times a day (QID) | ORAL | Status: DC
  Administered 2018-03-23: 1 mg via ORAL
  Filled 2018-03-23: qty 1

## 2018-03-23 MED ORDER — LORAZEPAM 1 MG PO TABS: 0.0000 mg | ORAL_TABLET | Freq: Two times a day (BID) | ORAL | Status: DC

## 2018-03-23 NOTE — ED Notes (Signed)
Patient's personal belongings stored ( inventoried) at [employer] F locker 2 , pt. wearing blue paper scrubs , security notified to wand pt.

## 2018-03-23 NOTE — ED Notes (Signed)
Breakfast tray ordered 

## 2018-03-23 NOTE — ED Notes (Signed)
Pt attempted to call her friend to come pick her up. States this is the friend who brought her to ED. Advised no answer. Offered for pt to call someone else - states she does not have anyone else to call. Offered bus pass - states she will take it. ALL belongings - 1 labeled belongings bag - returned to pt - Pt signed verifying all items present. Resources - [employer] and [facility] - discussed w/pt. Pt voiced understanding and agreement w/tx plan. D/C paperwork, resources, and bus pass given.

## 2018-03-23 NOTE — ED Notes (Signed)
Resting quietly in the recliner in A hallway at this time.

## 2018-03-23 NOTE — ED Notes (Signed)
Spoke with Dr. Patria Mane about DC of pt. Pt can be discharged at this point. MD is ok with pt being here until after breakfast.

## 2018-03-23 NOTE — ED Notes (Signed)
Pt states will call a friend to come get her after a while States she wants to rest more. Pt ate breakfast.
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
## 2018-03-23 NOTE — BH Assessment (Signed)
[facility] Assessment Progress Note    Per Nira Conn, Patient does not meet [facility] Admission Criteria. Patient does qualify for detox services.  Her information has been faxed to [facility] and [facility].  They will both have detox beds available after 5 pm tonight.  Patient will need to call and follow-up with these referrals.  [facility] [phone] and [facility] [phone].  Patient is psychiatrically cleared pending medical clearance.

## 2018-03-23 NOTE — ED Notes (Signed)
Pt noted w/stool incontinence in bed. Pt in shower.

## 2018-03-23 NOTE — ED Notes (Signed)
Spoke with charge RN, [place], about pt disposition. Will speak with md about printing off disposition for pt and see when she will be able to leave. Probably keep pt until morning when it is light out and she has a steady gait.

## 2018-03-23 NOTE — ED Provider Notes (Signed)
[facility] EMERGENCY DEPARTMENT Provider Note   CSN: [id number] Arrival date & time: 03/22/18  1912     History   Chief Complaint Chief Complaint  Patient presents with  . wants detox    HPI Miranda Wood is a 42 y.o. female.  The history is provided by the patient.  Alcohol Problem  This is a recurrent problem. The current episode started more than 1 week ago. The problem occurs daily. The problem has been gradually worsening. Pertinent negatives include no chest pain and no abdominal pain. Nothing aggravates the symptoms. Nothing relieves the symptoms.  Patient with history of alcohol abuse presents with alcohol intoxication.  Patient also reports severe anxiety.  Reports she feels like she may die.  She denies any homicidal ideation.  She has no active plan to harm her self.  She reports she drinks alcohol daily, and her friend told her she may have had a seizure a few weeks ago.  Currently she has no new pain complaints but does report severe anxiety.  Her neighbor brought her to the hospital, and there is hope that she can be medically cleared for detox  Past Medical History:  Diagnosis Date  . Blood transfusion without reported diagnosis   . Complication of anesthesia    HR and B/P were elevated with epidural    Patient Active Problem List   Diagnosis Date Noted  . Cesarean delivery delivered 07/10/2011    Past Surgical History:  Procedure Laterality Date  . CESAREAN SECTION    . LEG SURGERY    . LIPOMA EXCISION  2011  . TUBAL LIGATION Bilateral 2012  . vaginal del x3       OB History    Gravida  6   Para  4   Term  4   Preterm      AB  2   Living  5     SAB  2   TAB      Ectopic      Multiple  1   Live Births  2            Home Medications    Prior to Admission medications   Medication Sig Start Date End Date Taking? Authorizing Provider  HYDROcodone-acetaminophen (NORCO) 5-325 MG tablet Take 1 tablet  by mouth every 6 (six) hours as needed for moderate pain. 04/16/17   Ward, Chase Picket, PA-C  ibuprofen (ADVIL,MOTRIN) 800 MG tablet Take 1 tablet (800 mg total) by mouth every 8 (eight) hours as needed. 04/16/17   Ward, Chase Picket, PA-C  methocarbamol (ROBAXIN) 500 MG tablet Take 1 tablet (500 mg total) by mouth 2 (two) times daily. Patient not taking: Reported on 04/16/2017 03/28/17   Roxy Horseman, PA-C    Family History No family history on file.  Social History Social History   Tobacco Use  . Smoking status: Current Every Day Smoker    Packs/day: 0.50    Types: Cigarettes    Last attempt to quit: 10/29/2012    Years since quitting: 5.4  . Smokeless tobacco: Never Used  Substance Use Topics  . Alcohol use: Yes  . Drug use: No     Allergies   Penicillins   Review of Systems Review of Systems  Cardiovascular: Negative for chest pain.  Gastrointestinal: Negative for abdominal pain.  Psychiatric/Behavioral: The patient is nervous/anxious.   All other systems reviewed and are negative.    Physical Exam Updated Vital Signs BP (!) 98/42 (BP  Location: Left Arm)   Pulse 84   Temp 98.7 F (37.1 C) (Oral)   Resp 16   Ht 1.727 m ( )   Wt 62.1 kg (137 lb)   SpO2 96%   BMI 20.83 kg/m   Physical Exam CONSTITUTIONAL: Disheveled, appears older than stated age HEAD: Normocephalic/atraumatic EYES: EOMI ENMT: Mucous membranes moist NECK: supple no meningeal signs CV: S1/S2 noted LUNGS: Lungs are clear to auscultation bilaterally, no apparent distress ABDOMEN: soft, nontender GU:no cva tenderness NEURO: Pt is awake/alert/appropriate, moves all extremitiesx4.  No facial droop.  She can ambulate.  She has mild tremor to both hands EXTREMITIES: pulses normal/equal, full ROM SKIN: warm, color normal PSYCH: Anxious  ED Treatments / Results  Labs (all labs ordered are listed, but only abnormal results are displayed) Labs Reviewed  COMPREHENSIVE METABOLIC PANEL -  Abnormal; Notable for the following components:      Result Value   CO2 20 (*)    Glucose, Bld 126 (*)    BUN <5 (*)    AST 127 (*)    ALT 56 (*)    All other components within normal limits  ETHANOL - Abnormal; Notable for the following components:   Alcohol, Ethyl (B) 451 (*)    All other components within normal limits  CBC - Abnormal; Notable for the following components:   MCV 105.0 (*)    MCH 35.2 (*)    All other components within normal limits  RAPID URINE DRUG SCREEN, HOSP PERFORMED  I-STAT BETA HCG BLOOD, ED ([facility], [facility], [facility] ONLY)    EKG EKG Interpretation  Date/Time:  Saturday Mar 22 2018 19:45:42 EDT Ventricular Rate:  110 PR Interval:  130 QRS Duration: 74 QT Interval:  336 QTC Calculation: 454 R Axis:   74 Text Interpretation:  Sinus tachycardia Otherwise normal ECG Confirmed by Zadie Rhine ([id number]) on 03/23/2018 2:00:56 AM   Radiology No results found.  Procedures Procedures (including critical care time)  Medications Ordered in ED Medications  LORazepam (ATIVAN) injection 0-4 mg (2 mg Intravenous Given 03/23/18 0245)    Or  LORazepam (ATIVAN) tablet 0-4 mg ( Oral See Alternative 03/23/18 0245)  LORazepam (ATIVAN) injection 0-4 mg (has no administration in time range)    Or  LORazepam (ATIVAN) tablet 0-4 mg (has no administration in time range)  thiamine (VITAMIN B-1) tablet 100 mg (has no administration in time range)    Or  thiamine (B-1) injection 100 mg (has no administration in time range)  sodium chloride 0.9 % bolus 1,000 mL (0 mLs Intravenous Stopped 03/22/18 2007)  LORazepam (ATIVAN) tablet 2 mg (2 mg Oral Given 03/23/18 0230)     Initial Impression / Assessment and Plan / ED Course  I have reviewed the triage vital signs and the nursing notes.  Pertinent labs results that were available during my care of the patient were reviewed by me and considered in my medical decision making (see chart for details).     Pt is an alcoholic presenting  with intoxication and anxiety.  She has mild tremors but is improved with ativan She denied active SI plan but was evasive when I discussed suicidal thoughts I feel a psych consult is warranted due to ETOH abuse as well as anxiety and evasiveness about SI   Final Clinical Impressions(s) / ED Diagnoses   Final diagnoses:  Alcoholic intoxication without complication [facility])  Anxiety    ED Discharge Orders    None       Zadie Rhine,  MD 03/23/18 1610

## 2018-03-23 NOTE — ED Notes (Signed)
Danny from [facility] called. Pt is psychiatrically cleared and can be discharged once medically cleared due to her ETOH level being extremely elevated. Will speak with charge RN and MD. Printed Danny's dc note (per himself) to give to pt upon dc. Has phone numbers for follow up treatment. Pt sleeping soundly at this time.

## 2018-03-26 ENCOUNTER — Encounter (HOSPITAL_COMMUNITY): Payer: Self-pay

## 2018-03-26 ENCOUNTER — Emergency Department (HOSPITAL_COMMUNITY)
Admission: EM | Admit: 2018-03-26 | Discharge: 2018-03-27 | Disposition: A | Discharge status: Home / Self Care | Payer: Self-pay | Attending: Emergency Medicine | Admitting: Emergency Medicine

## 2018-03-26 DIAGNOSIS — F17210 Nicotine dependence, cigarettes, uncomplicated: Secondary | ICD-10-CM | POA: Insufficient documentation

## 2018-03-26 DIAGNOSIS — F419 Anxiety disorder, unspecified: Secondary | ICD-10-CM | POA: Insufficient documentation

## 2018-03-26 DIAGNOSIS — F10229 Alcohol dependence with intoxication, unspecified: Secondary | ICD-10-CM | POA: Insufficient documentation

## 2018-03-26 DIAGNOSIS — F1010 Alcohol abuse, uncomplicated: Secondary | ICD-10-CM

## 2018-03-26 NOTE — Progress Notes (Signed)
Consult request has been received. CSW attempting to follow up at present time.  Egon Dittus F. Stalin Gruenberg, LCSW, LCAS, CSI Clinical Social Worker Ph: [phone]  

## 2018-03-26 NOTE — Discharge Instructions (Addendum)
Stop drinking alcohol.  Please call detox center for detox   Return to ER if you have withdrawal, seizures, thoughts of harming yourself or others

## 2018-03-26 NOTE — ED Provider Notes (Signed)
[facility]-EMERGENCY DEPT Provider Note   CSN: [id number] Arrival date & time: 03/26/18  1839     History   Chief Complaint Chief Complaint  Patient presents with  . Medical Clearance    HPI Miranda Wood is a 42 y.o. female history of chronic alcohol abuse, here presenting with alcohol intoxication, social problems.  Patient was seen here several days ago for alcohol intoxication and was referred to outpatient residential treatment but she has not followed up.  She continues to drink alcohol for her anxiety.  Patient states that she has been anxious and has been medicated herself with alcohol.  Denies any suicidal or homicidal ideations.  Patient was brought here because she apparently was evicted from her apartment complex and police was called.  Patient states that she has nowhere to live.  She was seen by psychiatry several days ago and did not meet any inpatient admission criteria.  The history is provided by the patient.    Past Medical History:  Diagnosis Date  . Blood transfusion without reported diagnosis   . Complication of anesthesia    HR and B/P were elevated with epidural    Patient Active Problem List   Diagnosis Date Noted  . Cesarean delivery delivered 07/10/2011    Past Surgical History:  Procedure Laterality Date  . CESAREAN SECTION    . LEG SURGERY    . LIPOMA EXCISION  2011  . TUBAL LIGATION Bilateral 2012  . vaginal del x3       OB History    Gravida  6   Para  4   Term  4   Preterm      AB  2   Living  5     SAB  2   TAB      Ectopic      Multiple  1   Live Births  2            Home Medications    Prior to Admission medications   Medication Sig Start Date End Date Taking? Authorizing Provider  chlordiazePOXIDE (LIBRIUM) 25 MG capsule  PO TID x 1D, then 25-50mg  PO BID X 1D, then 25-50mg  PO QD X 1D Patient not taking: Reported on 03/26/2018 03/23/18   Miranda Bilis, MD    Family  History History reviewed. No pertinent family history.  Social History Social History   Tobacco Use  . Smoking status: Current Every Day Smoker    Packs/day: 0.50    Types: Cigarettes    Last attempt to quit: 10/29/2012    Years since quitting: 5.4  . Smokeless tobacco: Never Used  Substance Use Topics  . Alcohol use: Yes  . Drug use: No     Allergies   Penicillins   Review of Systems Review of Systems  Psychiatric/Behavioral: The patient is nervous/anxious.   All other systems reviewed and are negative.    Physical Exam Updated Vital Signs BP 117/86 (BP Location: Left Arm)   Pulse 92   Temp 98.3 F (36.8 C) (Oral)   Resp 20   LMP  (LMP Unknown)   SpO2 96%   Physical Exam  Constitutional: She is oriented to person, place, and time.  Slightly intoxicated, ambulating well, eating dinner   HENT:  Head: Normocephalic.  Mouth/Throat: Oropharynx is clear and moist.  Eyes: Pupils are equal, round, and reactive to light. Conjunctivae and EOM are normal.  Neck: Normal range of motion. Neck supple.  Cardiovascular: Normal rate, regular  rhythm and normal heart sounds.  Pulmonary/Chest: Effort normal and breath sounds normal. No stridor. No respiratory distress.  Abdominal: Soft. Bowel sounds are normal. She exhibits no distension. There is no tenderness. There is no guarding.  Musculoskeletal: Normal range of motion.  Neurological: She is alert and oriented to person, place, and time. No cranial nerve deficit. Coordination normal.  Skin: Skin is warm.  Psychiatric:  Slightly anxious, not suicidal   Nursing note and vitals reviewed.    ED Treatments / Results  Labs (all labs ordered are listed, but only abnormal results are displayed) Labs Reviewed - No data to display  EKG None  Radiology No results found.  Procedures Procedures (including critical care time)  Medications Ordered in ED Medications - No data to display   Initial Impression / Assessment  and Plan / ED Course  I have reviewed the triage vital signs and the nursing notes.  Pertinent labs & imaging results that were available during my care of the patient were reviewed by me and considered in my medical decision making (see chart for details).     Miranda Wood is a 42 y.o. female here with anxiety, alcohol intoxication. Patient did admit to drinking alcohol but able to ambulate well and has no tremors. Has some anxiety but has no suicidal or homicidal ideations. Seen by psych several days ago. Will consult social work since she has no place to go. Doesn't meet psych admission criteria   8:49 PM Social work able to give her bus pass, refer to outpatient detox. She can stay in waiting room and go to [facility] if she wants outpatient detox.    Final Clinical Impressions(s) / ED Diagnoses   Final diagnoses:  None    ED Discharge Orders    None       Miranda Pander, MD 03/26/18 2050

## 2018-03-26 NOTE — ED Notes (Signed)
Pt states she was evicted from her apartment two days and has been sleeping outside the apartment complex, pt admits to drinking last night The apartments called [employer] and they brought her here, she has no medical complaints but states she was hungry and doesn't have a plan for a place to live

## 2018-03-26 NOTE — Progress Notes (Signed)
CSW met with pt to offer resources.  Pt was tearful and presented as angry and hostile when offered resources commenting on the bad service until pt began to cry.   CSW actively validated the pt's opinion and provided feedback.  Pt later apologized and thanked the CSW.    Pt initally pt refused but as session progressed, pt accepted meeting schedules for area Alcoholics Anonymous and Narcotics Anonymous 12-Step meetings as well as inpatient/outpatient SA Tx resources.  CSW provided education to the pt as to the efficacy of 12-step programs for community support for those needing support in addition to or other than inpatient/outpatient treatment and provided pt with list of [employer] in [place] and educated the pt on making contact, assessing for open beds and requesting an interview/assessment for admission. Pt appreciated CSW's efforts and thanked the CSW.  CSW consulted with security and the [facility] ED CN who stated pt mat remain in the ED overnight to use the public phone to call and request interviews with the [facility] and to then exit the ED in the morning to go to an [place] or a shelter or to [place] by bus to be seen and to request an intake.  Pt was provided with a bus pass by the CSW.  Please reconsult if future social work needs arise.  CSW signing off, as social work intervention is no longer needed.  Alphonse Guild. Farha Dano, LCSW, LCAS, CSI Clinical Social Worker Ph: [phone]

## 2018-03-27 MED ORDER — LORAZEPAM 1 MG PO TABS
1.0000 mg | ORAL_TABLET | Freq: Once | ORAL | Status: AC
  Administered 2018-03-27: 1 mg via ORAL
  Filled 2018-03-27: qty 1

## 2018-03-27 MED ORDER — ONDANSETRON 8 MG PO TBDP
8.0000 mg | ORAL_TABLET | Freq: Once | ORAL | Status: AC
  Administered 2018-03-27: 8 mg via ORAL
  Filled 2018-03-27: qty 1

## 2018-03-27 NOTE — ED Notes (Signed)
Pt given bath supplies and breakfast tray ordered

## 2018-03-27 NOTE — ED Notes (Signed)
Pt given food and drink and allowed to wait until the morning for discharge to make phone calls off resource page

## 2018-04-28 DIAGNOSIS — F17210 Nicotine dependence, cigarettes, uncomplicated: Secondary | ICD-10-CM | POA: Insufficient documentation

## 2018-04-28 DIAGNOSIS — R45851 Suicidal ideations: Secondary | ICD-10-CM | POA: Insufficient documentation

## 2018-04-28 DIAGNOSIS — F10230 Alcohol dependence with withdrawal, uncomplicated: Secondary | ICD-10-CM | POA: Insufficient documentation

## 2018-04-28 DIAGNOSIS — F419 Anxiety disorder, unspecified: Secondary | ICD-10-CM | POA: Insufficient documentation

## 2018-04-28 DIAGNOSIS — F332 Major depressive disorder, recurrent severe without psychotic features: Secondary | ICD-10-CM | POA: Insufficient documentation

## 2018-04-29 ENCOUNTER — Encounter (HOSPITAL_COMMUNITY): Payer: Self-pay | Admitting: Emergency Medicine

## 2018-04-29 ENCOUNTER — Other Ambulatory Visit: Payer: Self-pay

## 2018-04-29 ENCOUNTER — Emergency Department (HOSPITAL_COMMUNITY)
Admission: EM | Admit: 2018-04-29 | Discharge: 2018-04-29 | Disposition: A | Discharge status: Other Acute Inpatient Hospital | Payer: Self-pay | Attending: Emergency Medicine | Admitting: Emergency Medicine

## 2018-04-29 ENCOUNTER — Inpatient Hospital Stay (HOSPITAL_COMMUNITY)
Admission: AD | Admit: 2018-04-29 | Discharge: 2018-04-30 | DRG: 885 | Disposition: A | Discharge status: Home / Self Care | Payer: Federal, State, Local not specified - Other | Source: Intra-hospital | Attending: Psychiatry | Admitting: Psychiatry

## 2018-04-29 ENCOUNTER — Encounter (HOSPITAL_COMMUNITY): Payer: Self-pay | Admitting: *Deleted

## 2018-04-29 DIAGNOSIS — R45851 Suicidal ideations: Secondary | ICD-10-CM

## 2018-04-29 DIAGNOSIS — F410 Panic disorder [episodic paroxysmal anxiety] without agoraphobia: Secondary | ICD-10-CM | POA: Diagnosis present

## 2018-04-29 DIAGNOSIS — F17210 Nicotine dependence, cigarettes, uncomplicated: Secondary | ICD-10-CM

## 2018-04-29 DIAGNOSIS — Z5900 Homelessness unspecified: Secondary | ICD-10-CM

## 2018-04-29 DIAGNOSIS — Z91410 Personal history of adult physical and sexual abuse: Secondary | ICD-10-CM | POA: Diagnosis not present

## 2018-04-29 DIAGNOSIS — G4700 Insomnia, unspecified: Secondary | ICD-10-CM | POA: Diagnosis present

## 2018-04-29 DIAGNOSIS — F10239 Alcohol dependence with withdrawal, unspecified: Secondary | ICD-10-CM | POA: Diagnosis not present

## 2018-04-29 DIAGNOSIS — F19980 Other psychoactive substance use, unspecified with psychoactive substance-induced anxiety disorder: Secondary | ICD-10-CM

## 2018-04-29 DIAGNOSIS — F1010 Alcohol abuse, uncomplicated: Secondary | ICD-10-CM | POA: Diagnosis present

## 2018-04-29 DIAGNOSIS — F1994 Other psychoactive substance use, unspecified with psychoactive substance-induced mood disorder: Secondary | ICD-10-CM

## 2018-04-29 DIAGNOSIS — Z560 Unemployment, unspecified: Secondary | ICD-10-CM

## 2018-04-29 DIAGNOSIS — Z79899 Other long term (current) drug therapy: Secondary | ICD-10-CM

## 2018-04-29 DIAGNOSIS — F419 Anxiety disorder, unspecified: Secondary | ICD-10-CM

## 2018-04-29 DIAGNOSIS — F10920 Alcohol use, unspecified with intoxication, uncomplicated: Secondary | ICD-10-CM

## 2018-04-29 DIAGNOSIS — F332 Major depressive disorder, recurrent severe without psychotic features: Principal | ICD-10-CM | POA: Diagnosis present

## 2018-04-29 DIAGNOSIS — Z880 Allergy status to penicillin: Secondary | ICD-10-CM

## 2018-04-29 DIAGNOSIS — F10939 Alcohol use, unspecified with withdrawal, unspecified: Secondary | ICD-10-CM

## 2018-04-29 DIAGNOSIS — F10230 Alcohol dependence with withdrawal, uncomplicated: Secondary | ICD-10-CM

## 2018-04-29 HISTORY — DX: Anxiety disorder, unspecified: F41.9

## 2018-04-29 LAB — COMPREHENSIVE METABOLIC PANEL
ALBUMIN: 4 g/dL (ref 3.5–5.0)
ALT: 21 U/L (ref 0–44)
AST: 40 U/L (ref 15–41)
Alkaline Phosphatase: 45 U/L (ref 38–126)
Anion gap: 9 (ref 5–15)
BUN: 5 mg/dL — ABNORMAL LOW (ref 6–20)
CHLORIDE: 108 mmol/L (ref 98–111)
CO2: 28 mmol/L (ref 22–32)
CREATININE: 0.51 mg/dL (ref 0.44–1.00)
Calcium: 8.6 mg/dL — ABNORMAL LOW (ref 8.9–10.3)
GFR calc Af Amer: >60 mL/min (ref >60)
GLUCOSE: 96 mg/dL (ref 70–99)
POTASSIUM: 3.8 mmol/L (ref 3.5–5.1)
SODIUM: 145 mmol/L (ref 135–145)
Total Bilirubin: 0.5 mg/dL (ref 0.3–1.2)
Total Protein: 7.5 g/dL (ref 6.5–8.1)

## 2018-04-29 LAB — CBC
HEMATOCRIT: 44.1 % (ref 36.0–46.0)
HEMOGLOBIN: 14.7 g/dL (ref 12.0–15.0)
MCH: 35 pg — ABNORMAL HIGH (ref 26.0–34.0)
MCHC: 33.3 g/dL (ref 30.0–36.0)
MCV: 105 fL — AB (ref 78.0–100.0)
Platelets: 361 10*3/uL (ref 150–400)
RBC: 4.2 MIL/uL (ref 3.87–5.11)
RDW: 12.9 % (ref 11.5–15.5)
WBC: 7.2 10*3/uL (ref 4.0–10.5)

## 2018-04-29 LAB — ETHANOL: Alcohol, Ethyl (B): 386 mg/dL (ref <10)

## 2018-04-29 LAB — RAPID URINE DRUG SCREEN, HOSP PERFORMED
Amphetamines: NOT DETECTED
Benzodiazepines: NOT DETECTED
COCAINE: NOT DETECTED
Opiates: NOT DETECTED
TETRAHYDROCANNABINOL: NOT DETECTED

## 2018-04-29 LAB — ACETAMINOPHEN LEVEL

## 2018-04-29 LAB — SALICYLATE LEVEL: Salicylate Lvl: <7 mg/dL (ref 2.8–30.0)

## 2018-04-29 LAB — PREGNANCY, URINE: PREG TEST UR: NEGATIVE

## 2018-04-29 MED ORDER — CHLORDIAZEPOXIDE HCL 25 MG PO CAPS: 25.0000 mg | ORAL_CAPSULE | Freq: Every day | ORAL | Status: DC

## 2018-04-29 MED ORDER — VITAMIN B-1 100 MG PO TABS
100.0000 mg | ORAL_TABLET | Freq: Every day | ORAL | Status: DC
  Administered 2018-04-29: 100 mg via ORAL

## 2018-04-29 MED ORDER — NICOTINE 21 MG/24HR TD PT24: 21.0000 mg | MEDICATED_PATCH | Freq: Every day | TRANSDERMAL | Status: DC

## 2018-04-29 MED ORDER — LORAZEPAM 1 MG PO TABS: 0.0000 mg | ORAL_TABLET | Freq: Two times a day (BID) | ORAL | Status: DC

## 2018-04-29 MED ORDER — ZOLPIDEM TARTRATE 5 MG PO TABS: 5.0000 mg | ORAL_TABLET | Freq: Every evening | ORAL | Status: DC | PRN

## 2018-04-29 MED ORDER — ACETAMINOPHEN 325 MG PO TABS: 650.0000 mg | ORAL_TABLET | ORAL | Status: DC | PRN

## 2018-04-29 MED ORDER — LORAZEPAM 1 MG PO TABS: 0.0000 mg | ORAL_TABLET | Freq: Four times a day (QID) | ORAL | Status: DC

## 2018-04-29 MED ORDER — LORAZEPAM 2 MG/ML IJ SOLN: 0.0000 mg | Freq: Four times a day (QID) | INTRAMUSCULAR | Status: DC

## 2018-04-29 MED ORDER — LORAZEPAM 1 MG PO TABS
0.0000 mg | ORAL_TABLET | Freq: Four times a day (QID) | ORAL | Status: DC
  Administered 2018-04-29: 1 mg via ORAL
  Administered 2018-04-29: 2 mg via ORAL
  Filled 2018-04-29: qty 1

## 2018-04-29 MED ORDER — VITAMIN B-1 100 MG PO TABS
100.0000 mg | ORAL_TABLET | Freq: Every day | ORAL | Status: DC
  Filled 2018-04-29: qty 1

## 2018-04-29 MED ORDER — ONDANSETRON 4 MG PO TBDP: 4.0000 mg | ORAL_TABLET | Freq: Four times a day (QID) | ORAL | Status: DC | PRN

## 2018-04-29 MED ORDER — CHLORDIAZEPOXIDE HCL 25 MG PO CAPS: 25.0000 mg | ORAL_CAPSULE | Freq: Three times a day (TID) | ORAL | Status: DC

## 2018-04-29 MED ORDER — ACETAMINOPHEN 325 MG PO TABS
650.0000 mg | ORAL_TABLET | Freq: Four times a day (QID) | ORAL | Status: DC | PRN
  Administered 2018-04-29 – 2018-04-30 (×2): 650 mg via ORAL
  Filled 2018-04-29 (×3): qty 2

## 2018-04-29 MED ORDER — CHLORDIAZEPOXIDE HCL 25 MG PO CAPS
25.0000 mg | ORAL_CAPSULE | Freq: Four times a day (QID) | ORAL | Status: DC | PRN
  Administered 2018-04-29: 25 mg via ORAL
  Filled 2018-04-29: qty 1

## 2018-04-29 MED ORDER — LORAZEPAM 2 MG/ML IJ SOLN: 0.0000 mg | Freq: Two times a day (BID) | INTRAMUSCULAR | Status: DC

## 2018-04-29 MED ORDER — MAGNESIUM HYDROXIDE 400 MG/5ML PO SUSP: 30.0000 mL | Freq: Every day | ORAL | Status: DC | PRN

## 2018-04-29 MED ORDER — CHLORDIAZEPOXIDE HCL 25 MG PO CAPS: 25.0000 mg | ORAL_CAPSULE | ORAL | Status: DC

## 2018-04-29 MED ORDER — THIAMINE HCL 100 MG/ML IJ SOLN: 100.0000 mg | Freq: Every day | INTRAMUSCULAR | Status: DC

## 2018-04-29 MED ORDER — ADULT MULTIVITAMIN W/MINERALS CH
1.0000 | ORAL_TABLET | Freq: Every day | ORAL | Status: DC
  Administered 2018-04-29 – 2018-04-30 (×2): 1 via ORAL
  Filled 2018-04-29 (×5): qty 1

## 2018-04-29 MED ORDER — VITAMIN B-1 100 MG PO TABS
100.0000 mg | ORAL_TABLET | Freq: Every day | ORAL | Status: DC
  Administered 2018-04-30: 100 mg via ORAL
  Filled 2018-04-29 (×3): qty 1

## 2018-04-29 MED ORDER — CHLORDIAZEPOXIDE HCL 25 MG PO CAPS
25.0000 mg | ORAL_CAPSULE | Freq: Four times a day (QID) | ORAL | Status: DC
  Administered 2018-04-29 – 2018-04-30 (×4): 25 mg via ORAL
  Filled 2018-04-29 (×4): qty 1

## 2018-04-29 MED ORDER — ONDANSETRON HCL 4 MG PO TABS: 4.0000 mg | ORAL_TABLET | Freq: Three times a day (TID) | ORAL | Status: DC | PRN

## 2018-04-29 MED ORDER — ALUM & MAG HYDROXIDE-SIMETH 200-200-20 MG/5ML PO SUSP: 30.0000 mL | Freq: Four times a day (QID) | ORAL | Status: DC | PRN

## 2018-04-29 MED ORDER — CHLORDIAZEPOXIDE HCL 25 MG PO CAPS
25.0000 mg | ORAL_CAPSULE | Freq: Once | ORAL | Status: AC
  Administered 2018-04-29: 25 mg via ORAL
  Filled 2018-04-29: qty 1

## 2018-04-29 MED ORDER — NICOTINE 21 MG/24HR TD PT24
21.0000 mg | MEDICATED_PATCH | Freq: Every day | TRANSDERMAL | Status: DC
  Filled 2018-04-29 (×3): qty 1

## 2018-04-29 MED ORDER — LOPERAMIDE HCL 2 MG PO CAPS: 2.0000 mg | ORAL_CAPSULE | ORAL | Status: DC | PRN

## 2018-04-29 MED ORDER — THIAMINE HCL 100 MG/ML IJ SOLN: 100.0000 mg | Freq: Once | INTRAMUSCULAR | Status: DC

## 2018-04-29 MED ORDER — LORAZEPAM 1 MG PO TABS
1.0000 mg | ORAL_TABLET | Freq: Once | ORAL | Status: AC
  Administered 2018-04-29: 1 mg via ORAL
  Filled 2018-04-29: qty 1

## 2018-04-29 MED ORDER — HYDROXYZINE HCL 25 MG PO TABS
25.0000 mg | ORAL_TABLET | Freq: Four times a day (QID) | ORAL | Status: DC | PRN
  Administered 2018-04-29: 25 mg via ORAL
  Filled 2018-04-29 (×2): qty 1

## 2018-04-29 MED ORDER — METOPROLOL SUCCINATE ER 25 MG PO TB24
25.0000 mg | ORAL_TABLET | Freq: Every day | ORAL | Status: DC
  Administered 2018-04-29 – 2018-04-30 (×2): 25 mg via ORAL
  Filled 2018-04-29 (×5): qty 1

## 2018-04-29 MED ORDER — ALUM & MAG HYDROXIDE-SIMETH 200-200-20 MG/5ML PO SUSP: 30.0000 mL | ORAL | Status: DC | PRN

## 2018-04-29 MED ORDER — LIDOCAINE 5 % EX OINT
TOPICAL_OINTMENT | Freq: Three times a day (TID) | CUTANEOUS | Status: DC | PRN
  Filled 2018-04-29: qty 35.44

## 2018-04-29 NOTE — ED Notes (Signed)
CIWA assessment deferred d/t pt is currently sleeping.  

## 2018-04-29 NOTE — ED Notes (Signed)
Pt walked onto unit and directly into bed. Pt unwilling to respond to nurse vocalizations or participate in nursing assessment at this time.

## 2018-04-29 NOTE — Tx Team (Signed)
Initial Treatment Plan 04/29/2018 4:38 PM Miranda FlockMichelle Gail Wood [id number]    PATIENT STRESSORS: Financial difficulties Medication change or noncompliance Substance abuse   PATIENT STRENGTHS: Ability for insight Average or above average intelligence Capable of independent living General fund of knowledge   PATIENT IDENTIFIED PROBLEMS: Depression Anxiety Suicidal thoughts Alcohol abuse "Help me with my stress"                     DISCHARGE CRITERIA:  Ability to meet basic life and health needs Improved stabilization in mood, thinking, and/or behavior Verbal commitment to aftercare and medication compliance Withdrawal symptoms are absent or subacute and managed without 24-hour nursing intervention  PRELIMINARY DISCHARGE PLAN: Attend aftercare/continuing care group  PATIENT/FAMILY INVOLVEMENT: This treatment plan has been presented to and reviewed with the patient, Miranda FlockMichelle Gail Wood, and/or family member, .  The patient and family have been given the opportunity to ask questions and make suggestions.  Abraham Entwistle, [place], [place]RN 04/29/2018, 4:38 PM

## 2018-04-29 NOTE — ED Triage Notes (Signed)
Patient complaining of panic attack and anxiety. Patient states life is causing the anxiety. Patient states maybe she wants to kill herself. Patient was asked to give a definitive answer patient still said maybe.

## 2018-04-29 NOTE — BH Assessment (Signed)
[facility]  Per Juanetta BeetsJacqueline Norman, DO, this pt requires psychiatric hospitalization at this time.  Berneice Heinrichina Tate, RN, [facility]; [facility] will be ready to receive pt at 13:00.  Pt has signed Voluntary Admission and Consent for Treatment, as well as Consent to Release Information to the [facility], and a notification call has been placed.  Signed forms [facility].  Pt's nurse, Marchelle Folksmanda, has been notified, and agrees to send original paperwork along with pt via [facility] [place], and to call report to [phone].  Doylene Canninghomas Mela [place], [place] Behavioral Health Coordinator [phone]

## 2018-04-29 NOTE — ED Notes (Signed)
[id number] Expected date:  Expected time:  Means of arrival:  Comments: [place]
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
## 2018-04-29 NOTE — ED Notes (Signed)
Pt stated "I have 5 children, 2 are in college.  I got evicted and have been staying @ the shelter but they didn't have any rooms tonight."

## 2018-04-29 NOTE — ED Notes (Signed)
Greta DoomBowie, PA-C informed of pt's BAL of 386.
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
## 2018-04-29 NOTE — BHH Counselor (Signed)
Per Lynett Grimesina Tate,RN, [facility] after 13:00.

## 2018-04-29 NOTE — ED Notes (Signed)
Bed: WTR9 Expected date:  Expected time:  Means of arrival:  Comments: 
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
## 2018-04-29 NOTE — Progress Notes (Signed)
Attended group 

## 2018-04-29 NOTE — ED Notes (Signed)
[facility] Personal property given to transport for transfer. Pt ambulatory off unit.

## 2018-04-29 NOTE — ED Notes (Signed)
Attempted to call nursing report to [facility].  
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
## 2018-04-29 NOTE — BH Assessment (Signed)
[facility] Assessment Progress Note   Pt BAL is 386 at 01:01.  TTS to assess pt when it is below 200.

## 2018-04-30 MED ORDER — HYDROXYZINE HCL 25 MG PO TABS: 25.0000 mg | ORAL_TABLET | Freq: Four times a day (QID) | ORAL | 0 refills | Status: DC | PRN

## 2018-04-30 NOTE — BHH Counselor (Signed)
Adult Comprehensive Assessment  Patient ID: Marlia Schewe, female   DOB: 1976/04/19, 42 y.o.   MRN: [id number]  Information Source: Information source: Patient  Current Stressors:  Patient states their primary concerns and needs for treatment are:: medication is making me more anxious. 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 no relationship with mother; father deceased; grandmother deceased Financial / Lack of resources (include bankruptcy): none-no income and no Presenter, broadcasting / Lack of housing: homeless x3 days. evicted from apt. [facility] Physical health (include injuries & life threatening diseases): none identified  Social relationships: poor Substance abuse: alcohol-"whatever I can get. I average 3-5 drinks a day." on and off alcohol abuse for several years. "I don't drink when I'm pregnant." Bereavement / Loss: separated from husband 2 years ago. " we are going to court about the kids and custody issues."   Living/Environment/Situation:  Living Arrangements: Alone Living conditions (as described by patient or guardian): homeless x 3 days and staying in shelter.  Who else lives in the home?: other homeless people How long has patient lived in current situation?: 3 days. prior to that, pt was evicted from apt that she was living in for 12 years.  What is atmosphere in current home: Chaotic, Temporary  Family History:  Marital status: Separated Separated, when?: 2 years ago What types of issues is patient dealing with in the relationship?: declined to answer Additional relationship information: this was pt's second marriage.  Are you sexually  active?: Yes What is your sexual orientation?: heterosexual Has your sexual activity been affected by drugs, alcohol, medication, or emotional stress?: n/a  Does patient have children?: Yes How many children?: 5 How is patient's relationship with their children?: 2 kids in college and are adults. twin boys and 40yo boy--"they are with their grandmother." "They are doing find. They don't really know what's going on with me."   Childhood History:  By whom was/is the patient raised?: Foster parents, Grandparents Additional childhood history information: pt bounced between foster care and living with her grandmother and uncle. Description of patient's relationship with caregiver when they were a child: close to grandmother. never know bio father. "My mom was never around. I think she was using drugs." Patient's description of current relationship with people who raised him/her: no relationship with mother; grandmother deceased. "I hae no family to speak of."  How were you disciplined when you got in trouble as a child/adolescent?: n/a  Does patient have siblings?: Yes Number of Siblings: 2 Description of patient's current relationship with siblings: "I have a brother and a sister but don't know where they are and don't have contact with them." Did patient suffer any verbal/emotional/physical/sexual abuse as a child?: Yes("I have no memories of childhood so I'm no sure.") Did patient suffer from severe childhood neglect?: No Has patient ever been sexually abused/assaulted/raped as an adolescent or adult?: No Was the patient ever a victim of a crime or a disaster?: No Witnessed domestic violence?: No Has patient been effected by domestic violence as an adult?: No  Education:  Highest grade of school patient has completed: associates degree Currently a Consulting civil engineer?: No Learning disability?: No  Employment/Work Situation:   Employment situation: Unemployed Patient's job has been  impacted by current  illness: Yes Describe how patient's job has been impacted: lost job at [employer]--childcare issues.  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?: (no Financial plannermilitary service) Are There Guns or Other Weapons in Your Home?: No Are These Weapons Safely Secured?: (n/a)  Financial Resources:   Financial resources: No income Does patient have a Lawyerrepresentative payee or guardian?: No  Alcohol/Substance Abuse:   What has been your use of drugs/alcohol within the last 12 months?: alchol use daily "as much as I can get which is usually 3-5 drinks." no drug use reported.  If attempted suicide, did drugs/alcohol play a role in this?: No Alcohol/Substance Abuse Treatment Hx: Denies past history If yes, describe treatment: n/a  Has alcohol/substance abuse ever caused legal problems?: No  Social Support System:   Forensic psychologistatient's Community Support System: Poor Describe Community Support System: IRC; no family supports Type of faith/religion: n/a How does patient's faith help to cope with current illness?: n/a  Leisure/Recreation:   Leisure and Hobbies: spending time with my kids. "today is my son's birthday."   Strengths/Needs:   What is the patient's perception of their strengths?: "motivated to get back to the old me."  Patient states they can use these personal strengths during their treatment to contribute to their recovery: "My kids motivate me." Patient states these barriers may affect/interfere with their treatment: homeless and no income Patient states these barriers may affect their return to the community: see above Other important information patient would like considered in planning for their treatment: pt declines residential treatment but is open for therapy referral.   Discharge Plan:   Currently receiving community mental health services: Yes  ([facility]om[facility]) Patient states concerns and preferences for aftercare planning are: [facility] and seeks referral for therapy--[facility] Patient states they will know when they are safe and ready for discharge when: "When my mood is more stable and I'm less depressed."  Does patient have access to transportation?: Yes(bus) Does patient have financial barriers related to discharge medications?: Yes Patient description of barriers related to discharge medications: no income; no insurance; pt homeless and [facility]  Will patient be returning to same living situation after discharge?: Yes(pt declines residential treatment and plans to return to shelter at discharge)  Summary/Recommendations:   Summary and Recommendations (to be completed by the evaluator): Patient is 42yo [place] She presents to the hospital seeking treatment for SI, depression, mood instability, alcohol abuse, and for medication stabilization. She denies SI/HI/AVH currently. Pt reports recent homelessness, job loss, is separated, has 5 children, and has been abusing alcohol. She denies drug use. Pt has a primary diagnosis of MDD. Recommendations for pt include: crisis stabilization, therapeutic milieu ,encourage group attendance and participation, medication management for detox/mood stabilization, and development of comprehensive mental wellness/sobriety plan. CSW assessing for appropriate referrals.   Rona RavensHeather S Braxxton Stoudt LCSW 04/30/2018 10:26 AM

## 2018-04-30 NOTE — [facility] Suicide Risk Assessment (Signed)
[facility] INPATIENT:  Family/Significant Other Suicide Prevention Education  Suicide Prevention Education:  Patient Refusal for Family/Significant Other Suicide Prevention Education: The patient Miranda Wood has refused to provide written consent for family/significant other to be provided Family/Significant Other Suicide Prevention Education during admission and/or prior to discharge.  Physician notified.  SPE completed with pt, as pt refused to consent to family contact. SPI pamphlet provided to pt and pt was encouraged to share information with support network, ask questions, and talk about any concerns relating to SPE. Pt denies access to guns/firearms and verbalized understanding of information provided. Mobile Crisis information also provided to pt.   Rona RavensHeather S Virdell Hoiland LCSW 04/30/2018, 10:26 AM

## 2018-04-30 NOTE — [facility] Suicide Risk Assessment (Signed)
[facility]   Principal Problem: <principal problem not specified> Discharge Diagnoses:  Patient Active Problem List   Diagnosis Date Noted  . Alcohol dependence with withdrawal with complication (HCC) [F10.239] 04/29/2018  . Major depressive disorder, recurrent severe without psychotic features (HCC) [F33.2] 04/29/2018  . Substance induced mood disorder (HCC) [F19.94]   . 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 Alcohol withdrawal without perceptual disturbances with complication (HCC) [F10.239]   . Cesarean delivery delivered [O82] 07/10/2011    Total Time spent with patient: 30 minutes  Musculoskeletal: Strength & Muscle Tone: within normal limits Gait & Station: normal Patient leans: N/A  Psychiatric Specialty Exam: Review of Systems  All other systems reviewed and are negative.   Blood pressure (!) 123/93, pulse 91, temperature 98.2 F (36.8 C), temperature source Oral, resp. rate 18, height 5\' 8"  (1.727 m), weight 63 kg (139 lb).Body mass index is 21.13 kg/m.  General Appearance: Casual  Eye Contact::  Fair  Speech:  Normal Rate409  Volume:  Normal  Mood:  Anxious  Affect:  Congruent  Thought Process:  Coherent  Orientation:  Full (Time, Place, and Person)  Thought Content:  Logical  Suicidal Thoughts:  No  Homicidal Thoughts:  No  Memory:  Immediate;   Fair Recent;   Fair Remote;   Fair  Judgement:  Impaired  Insight:  Lacking  Psychomotor Activity:  Increased  Concentration:  Fair  Recall:  FiservFair  Fund of Knowledge:Fair  Language: Good  Akathisia:  Negative  Handed:  Right  AIMS (if indicated):     Assets:  Desire for Improvement  Sleep:  Number of Hours: 6.75  Cognition: WNL  ADL's:  Intact   Mental Status Per Nursing Assessment::   On Admission:  Suicidal ideation indicated by patient, Self-harm thoughts  Demographic Factors:  Divorced or widowed, Caucasian, Low socioeconomic status and Living alone  Loss  Factors: NA  Historical Factors: Impulsivity  Risk Reduction Factors:   Sense of responsibility to family  Continued Clinical Symptoms:  Alcohol/Substance Abuse/Dependencies  Cognitive Features That Contribute To Risk:  Closed-mindedness    Suicide Risk:  Minimal: No identifiable suicidal ideation.  Patients presenting with no risk factors but with morbid ruminations; may be classified as minimal risk based on the severity of the depressive symptoms  Follow-up Information    [facility]   Contact information: [address] [place] [address] [phone]        [facility] Follow up.   Specialty[facility]  [facility] Contact information: [address] [place] [address] [phone]           Plan Of Care/Follow-up recommendations:  Activity:  ad lib  Antonieta PertGreg Lawson Clary, MD 04/30/2018, 1:19 PM

## 2018-04-30 NOTE — Progress Notes (Signed)
Pt discharged home in a bus pass. Pt was ambulatory,stable and appreciative at that time. All papers and prescriptions were given and valuables returned. Verbal understanding expressed. Denies SI/HI and A/VH. Pt given opportunity to express concerns and ask questions.

## 2018-04-30 NOTE — Tx Team (Signed)
Interdisciplinary Treatment and Diagnostic Plan Update  04/30/2018 Time of Session: 0830AM Miranda Wood MRN: [id number]  Principal Diagnosis: MDD, recurrent, severe without psychotic features   Secondary Diagnoses: Active Problems:   Alcohol dependence with withdrawal with complication (HCC)   Major depressive disorder, recurrent severe without psychotic features (HCC)   Substance induced mood disorder (HCC)   Substance-induced anxiety disorder (HCC)   Alcohol withdrawal without perceptual disturbances with complication (HCC)   Current Medications:  Current Facility-Administered Medications  Medication Dose Route Frequency Provider Last Rate Last Dose  . acetaminophen (TYLENOL) tablet 650 mg  650 mg Oral Q6H PRN Charm Rings, NP   650 mg at 04/30/18 7829  . alum & mag hydroxide-simeth (MAALOX/MYLANTA) 200-200-20 MG/5ML suspension 30 mL  30 mL Oral Q6H PRN Charm Rings, NP      . chlordiazePOXIDE (LIBRIUM) capsule 25 mg  25 mg Oral Q6H PRN Armandina Stammer I, NP   25 mg at 04/29/18 1823  . chlordiazePOXIDE (LIBRIUM) capsule 25 mg  25 mg Oral QID Armandina Stammer I, NP   25 mg at 04/30/18 0810   Followed by  . [START ON 05/01/2018] chlordiazePOXIDE (LIBRIUM) capsule 25 mg  25 mg Oral TID Sanjuana Kava, NP       Followed by  . [START ON 05/02/2018] chlordiazePOXIDE (LIBRIUM) capsule 25 mg  25 mg Oral BH-qamhs Armandina Stammer I, NP       Followed by  . [START ON 05/03/2018] chlordiazePOXIDE (LIBRIUM) capsule 25 mg  25 mg Oral Daily Wood, Miranda I, NP      . hydrOXYzine (ATARAX/VISTARIL) tablet 25 mg  25 mg Oral Q6H PRN Armandina Stammer I, NP   25 mg at 04/29/18 2127  . lidocaine (XYLOCAINE) 5 % ointment   Topical TID PRN Antonieta Pert, MD      . loperamide (IMODIUM) capsule 2-4 mg  2-4 mg Oral PRN Armandina Stammer I, NP      . magnesium hydroxide (MILK OF MAGNESIA) suspension 30 mL  30 mL Oral Daily PRN Charm Rings, NP      . metoprolol succinate (TOPROL-XL) 24 hr tablet 25 mg  25 mg  Oral Daily Antonieta Pert, MD   25 mg at 04/30/18 5621  . multivitamin with minerals tablet 1 tablet  1 tablet Oral Daily Armandina Stammer I, NP   1 tablet at 04/30/18 0810  . nicotine (NICODERM CQ - dosed in mg/24 hours) patch 21 mg  21 mg Transdermal Daily Lord, Jamison Y, NP      . ondansetron (ZOFRAN-ODT) disintegrating tablet 4 mg  4 mg Oral Q6H PRN Armandina Stammer I, NP      . thiamine (B-1) injection 100 mg  100 mg Intramuscular Once Armandina Stammer I, NP      . thiamine (VITAMIN B-1) tablet 100 mg  100 mg Oral Daily Armandina Stammer I, NP   100 mg at 04/30/18 0810   PTA Medications: Medications Prior to Admission  Medication Sig Dispense Refill Last Dose  . chlordiazePOXIDE (LIBRIUM) 25 MG capsule 50mg  PO TID x 1D, then 25-50mg  PO BID X 1D, then 25-50mg  PO QD X 1D (Patient not taking: Reported on 03/26/2018) 10 capsule 0 Not Taking at Unknown time  . metoprolol tartrate (LOPRESSOR) 25 MG tablet Take 25 mg by mouth daily.   Past Week at Unknown time  . mirtazapine (REMERON) 30 MG tablet Take 15 mg by mouth at bedtime.   Past Week at Unknown time  Patient Stressors: Financial difficulties Medication change or noncompliance Substance abuse  Patient Strengths: Ability for insight Average or above average intelligence Capable of independent living General fund of knowledge  Treatment Modalities: Medication Management, Group therapy, Case management,  1 to 1 session with clinician, Psychoeducation, Recreational therapy.   Physician Treatment Plan for Primary Diagnosis: MDD, recurrent, severe without psychotic features  Long Term Goal(s): Improvement in symptoms so as ready for discharge Improvement in symptoms so as ready for discharge   Short Term Goals: Ability to identify changes in lifestyle to reduce recurrence of condition will improve Ability to verbalize feelings will improve Ability to disclose and discuss suicidal ideas Ability to demonstrate self-control will improve Ability  to identify and develop effective coping behaviors will improve Ability to maintain clinical measurements within normal limits will improve Ability to identify triggers associated with substance abuse/mental health issues will improve Ability to identify changes in lifestyle to reduce recurrence of condition will improve Ability to verbalize feelings will improve Ability to disclose and discuss suicidal ideas Ability to demonstrate self-control will improve Ability to identify and develop effective coping behaviors will improve Ability to maintain clinical measurements within normal limits will improve Ability to identify triggers associated with substance abuse/mental health issues will improve  Medication Management: Evaluate patient's response, side effects, and tolerance of medication regimen.  Therapeutic Interventions: 1 to 1 sessions, Unit Group sessions and Medication administration.  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)   Major depressive disorder, recurrent severe without psychotic features (HCC)   Substance induced mood disorder (HCC)   Substance-induced anxiety disorder (HCC)   Alcohol withdrawal without perceptual disturbances with complication (HCC)  Long Term Goal(s): Improvement in symptoms so as ready for discharge Improvement in symptoms so as ready for discharge   Short Term Goals: Ability to identify changes in lifestyle to reduce recurrence of condition will improve Ability to verbalize feelings will improve Ability to disclose and discuss suicidal ideas Ability to demonstrate self-control will improve Ability to identify and develop effective coping behaviors will improve Ability to maintain clinical measurements within normal limits will improve Ability to identify triggers associated with substance abuse/mental health issues will  improve Ability to identify changes in lifestyle to reduce recurrence of condition will improve Ability to verbalize feelings will improve Ability to disclose and discuss suicidal ideas Ability to demonstrate self-control will improve Ability to identify and develop effective coping behaviors will improve Ability to maintain clinical measurements within normal limits will improve Ability to identify triggers associated with substance abuse/mental health issues will improve     Medication Management: Evaluate patient's response, side effects, and tolerance of medication regimen.  Therapeutic Interventions: 1 to 1 sessions, Unit Group sessions and Medication administration.  Evaluation of Outcomes: Progressing   RN Treatment Plan for Primary Diagnosis: MDD, recurrent, severe without psychotic features  Long Term Goal(s): Knowledge of disease and therapeutic regimen to maintain health will improve  Short Term Goals: Ability to remain free from injury will improve, Ability to demonstrate self-control, Ability to disclose and discuss suicidal ideas and Ability to identify and develop effective coping behaviors will improve  Medication Management: RN will administer medications as ordered by provider, will assess and evaluate patient's response and provide education to patient for prescribed medication. RN will report any adverse and/or side effects to prescribing provider.  Therapeutic Interventions: 1 on 1 counseling sessions, Psychoeducation, Medication administration, Evaluate responses to  treatment, Monitor vital signs and CBGs as ordered, Perform/monitor CIWA, COWS, AIMS and Fall Risk screenings as ordered, Perform wound care treatments as ordered.  Evaluation of Outcomes: Progressing  LCSW Treatment Plan for Primary Diagnosis: MDD, recurrent, severe without psychotic features  Long Term Goal(s): Safe transition to appropriate next level of care at discharge, Engage patient in therapeutic  group addressing interpersonal concerns.  Short Term Goals: Engage patient in aftercare planning with referrals and resources, Facilitate patient progression through stages of change regarding substance use diagnoses and concerns and Identify triggers associated with mental health/substance abuse issues  Therapeutic Interventions: Assess for all discharge needs, 1 to 1 time with Social worker, Explore available resources and support systems, Assess for adequacy in community support network, Educate family and significant other(s) on suicide prevention, Complete Psychosocial Assessment, Interpersonal group therapy.  Evaluation of Outcomes: Progressing   Progress in Treatment: Attending groups: Yes. Participating in groups: Yes. Taking medication as prescribed: Yes. Toleration medication: Yes. Family/Significant other contact made: SPE completed with pt; pt declined to consent to collateral contact.  Patient understands diagnosis: Yes. Discussing patient identified problems/goals with staff: Yes. Medical problems stabilized or resolved: Yes. Denies suicidal/homicidal ideation: Yes. Issues/concerns per patient self-inventory: No. Other: n/a   New problem(s) identified: No, Describe:  n/a  New Short Term/Long Term Goal(s): detox, medication management for mood stabilization; elimination of SI thoughts; development of comprehensive mental wellness/sobriety plan.   Patient Goals:  "to get my medications adjusted and get set up with a therapist."   Discharge Plan or Barriers: CSW assessing. Pt declined residential referrals. She is interested in [facility] and [facility] pamphlet, Mobile Crisis information, and AA/NA information provided to patient for additional community support and resources.   Reason for Continuation of Hospitalization: Anxiety Depression Medication stabilization Suicidal  ideation Withdrawal symptoms  Estimated Length of Stay: Friday, 05/02/18  Attendees: Patient: Miranda BrilliantMichelle Wood 04/30/2018 10:30 AM  Physician: Dr. Jola Babinskilary MD 04/30/2018 10:30 AM  Nursing: Zada FindersJane O RN 04/30/2018 10:30 AM  RN Care Manager:x 04/30/2018 10:30 AM  Social Worker: Corrie MckusickHeather Valma Rotenberg LCSW 04/30/2018 10:30 AM  Recreational Therapist: x 04/30/2018 10:30 AM  Other: Armandina StammerAgnes Nwoko NP;Feliz Beamravis Money NP 04/30/2018 10:30 AM  Other:  04/30/2018 10:30 AM  Other: 04/30/2018 10:30 AM    Scribe for Treatment Team: Rona RavensHeather S Vauda Salvucci, LCSW 04/30/2018 10:30 AM
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
## 2018-04-30 NOTE — [facility] Group Notes (Signed)
[facility] 04/30/2018 1:15pm  Type of Therapy: Mental Health Association Presentation  Participation Level: Invited. Chose to remain in room.    Rona RavensHeather S Dama Hedgepeth, LCSW 04/30/2018 10:51 AM

## 2018-04-30 NOTE — Therapy (Signed)
Occupational Therapy Group Treatment Note  Date:  04/30/2018 Time:  11:26 AM  Group Topic/Focus:  Stress Management  Participation Level:  Minimal  Participation Quality:  Appropriate  Affect:  Depressed and Flat  Cognitive:  Appropriate  Insight: Improving and Limited  Engagement in Group:  Lacking  Modes of Intervention:  Activity, Discussion, Education and Socialization  Additional Comments:    S: "I have done PMR before"  O: Stress management group completed to use as productive coping strategy, to help mitigate maladaptive coping to integrate in functional BADL/IADL when reintegrating into community. Education given on the definition of stress and its cognitive, behavioral, emotional, and physical effects on the body. Stress management tools worksheet completed to identify negative coping mechanisms and their short and long term effects vs positive coping mechanisms with demonstration. Coping strategies taught include: relaxation based- deep breathing, counting to 10, taking a 1 minute vacation, acceptance, stress balls, relaxation audio/video, visual/mental imagery. Positive mental attitude- gratitude, acceptance, cognitive reframing, positive self talk, anger management. Self control circle activity completed to identify areas of control and areas not within personal control to facilitate acceptance in daily relationships and life. Progressive muscle relaxation script administered to facilitate relaxation response for more productive engagement in BADL/IADL. Adult coloring and relaxation tips worksheet given at end of session.   A: Pt presents to group with flat and depressed affect, guarded. Pt appears to be actively listening, but did not offer much sharing or examples throughout group. Pt engaged in stress management tools worksheet, offering examples and how to improve current practices. Pt did not offer examples, but nodded head when asked if she suppresses her emotions. Pt  engaged in PMR script, with much enjoyment and significant relaxation response.   P: Pt provided with education on stress management activities to implement into daily routine. Handouts given to facilitate carryover when reintegrating into community  ?     Dalphine HandingKaylee Quantel Mcinturff, MSOT, OTR/L  RadcliffKaylee Sarely Stracener 04/30/2018, 11:26 AM

## 2018-04-30 NOTE — Progress Notes (Signed)
  [facility] :  Will you be returning to the same living situation after discharge:  Yes,  shelter At discharge, do you have transportation home?: Yes,  bus pass provided Do you have the ability to pay for your medications: Yes,  mental health  Release of information consent forms completed and submitted to medical records by CSW.  Patient to Follow up at: Follow-up Information    [facility] Follow up on 05/05/2018.   Why:  Please walk in on Monday at 8:00AM for hospital follow-up. Be advised that there may be a wait. You also have a scheduled appt on Monday, 7/15 at 8:00AM. Thank you.  Contact information: [address] [place] [address] [phone]        [facility] Follow up on 05/14/2018.   Specialty:  Behavioral Health Why:  Therapy appt with on Wed, 7/17 with Andrena Mewsave Traeford. Please arrive at 1:30PM to complete new patient paperwork and 2:00PM for appt. Please bring photo ID to this appt and call to reschedule/cancel within 48 hrs of appt if necessary. Thank you.  Contact information: [address] [place] [address] [phone]           Next level of care provider has [facility] Link:no  Safety Planning and Suicide Prevention discussed: Yes,  SPE completed with pt; pt declined to consent to collateral contact.   Have you used any form of tobacco in the last 30 days? (Cigarettes, Smokeless Tobacco, Cigars, and/or Pipes): Yes  Has patient been referred to the Quitline?: Patient refused referral  Patient has been referred for addiction treatment: Yes  Rona RavensHeather S Courney Garrod, LCSW 04/30/2018, 2:00 PM

## 2018-06-25 ENCOUNTER — Emergency Department (HOSPITAL_COMMUNITY): Payer: Self-pay

## 2018-06-25 ENCOUNTER — Emergency Department (HOSPITAL_COMMUNITY)
Admission: EM | Admit: 2018-06-25 | Discharge: 2018-06-26 | Disposition: A | Discharge status: Home / Self Care | Payer: Self-pay | Attending: Emergency Medicine | Admitting: Emergency Medicine

## 2018-06-25 ENCOUNTER — Encounter (HOSPITAL_COMMUNITY): Payer: Self-pay | Admitting: Emergency Medicine

## 2018-06-25 DIAGNOSIS — R002 Palpitations: Secondary | ICD-10-CM | POA: Insufficient documentation

## 2018-06-25 DIAGNOSIS — R0789 Other chest pain: Secondary | ICD-10-CM | POA: Insufficient documentation

## 2018-06-25 DIAGNOSIS — R112 Nausea with vomiting, unspecified: Secondary | ICD-10-CM | POA: Insufficient documentation

## 2018-06-25 DIAGNOSIS — F419 Anxiety disorder, unspecified: Secondary | ICD-10-CM | POA: Insufficient documentation

## 2018-06-25 DIAGNOSIS — Z79899 Other long term (current) drug therapy: Secondary | ICD-10-CM | POA: Insufficient documentation

## 2018-06-25 DIAGNOSIS — R200 Anesthesia of skin: Secondary | ICD-10-CM | POA: Insufficient documentation

## 2018-06-25 DIAGNOSIS — F17210 Nicotine dependence, cigarettes, uncomplicated: Secondary | ICD-10-CM | POA: Insufficient documentation

## 2018-06-25 NOTE — ED Triage Notes (Signed)
Pt reports she feels like her heart is racing and her left arm is numb.  Pt states this "might be a panic attack, but feels different."  Pt began to cry in triage, would not let blood be drawn.  Later pt stated she was having chest pain.

## 2018-06-26 LAB — BASIC METABOLIC PANEL
ANION GAP: 10 (ref 5–15)
BUN: 5 mg/dL — AB (ref 6–20)
CHLORIDE: 99 mmol/L (ref 98–111)
CO2: 26 mmol/L (ref 22–32)
Calcium: 8.9 mg/dL (ref 8.9–10.3)
Creatinine, Ser: 0.56 mg/dL (ref 0.44–1.00)
GFR calc Af Amer: >60 mL/min (ref >60)
Glucose, Bld: 84 mg/dL (ref 70–99)
POTASSIUM: 3.5 mmol/L (ref 3.5–5.1)
Sodium: 135 mmol/L (ref 135–145)

## 2018-06-26 LAB — CBC
HEMATOCRIT: 41.2 % (ref 36.0–46.0)
HEMOGLOBIN: 13.4 g/dL (ref 12.0–15.0)
MCH: 33.3 pg (ref 26.0–34.0)
MCHC: 32.5 g/dL (ref 30.0–36.0)
MCV: 102.5 fL — AB (ref 78.0–100.0)
Platelets: 266 10*3/uL (ref 150–400)
RBC: 4.02 MIL/uL (ref 3.87–5.11)
RDW: 12.9 % (ref 11.5–15.5)
WBC: 7.1 10*3/uL (ref 4.0–10.5)

## 2018-06-26 LAB — I-STAT TROPONIN, ED: Troponin i, poc: 0.02 ng/mL (ref 0.00–0.08)

## 2018-06-26 LAB — POC URINE PREG, ED: PREG TEST UR: NEGATIVE

## 2018-06-26 LAB — ETHANOL

## 2018-06-26 MED ORDER — LORAZEPAM 1 MG PO TABS
1.0000 mg | ORAL_TABLET | Freq: Once | ORAL | Status: AC
  Administered 2018-06-26: 1 mg via ORAL
  Filled 2018-06-26: qty 1

## 2018-06-26 MED ORDER — ASPIRIN 81 MG PO CHEW
324.0000 mg | CHEWABLE_TABLET | Freq: Once | ORAL | Status: AC
  Administered 2018-06-26: 324 mg via ORAL
  Filled 2018-06-26: qty 4

## 2018-06-26 NOTE — Discharge Instructions (Addendum)
He was seen today for chest pain.  This was in the setting of anxiety.  Your work-up is largely reassuring.  However, follow-up closely with cardiology for further evaluation and possible stress testing.  If you develop any new or worsening symptoms you should be reevaluated.

## 2018-06-26 NOTE — ED Provider Notes (Signed)
[facility] EMERGENCY DEPARTMENT Provider Note   CSN: [id number] Arrival date & time: 06/25/18  2314     History   Chief Complaint Chief Complaint  Patient presents with  . Chest Pain  . Emesis    HPI Miranda Wood is a 42 y.o. female.  HPI  This is a 42 year old female who presents with palpitations and chest pain.  Patient reports that she has had lots of increased stress at home.  She was in the high stress situation when she had onset of palpitations and chest pain.  She reports some numbness into her left arm.  She has had this before.  She reports history of hypertension but no other risk factors for heart disease.  She has not taken anything for her symptoms.  She continues to report 9 out of 10 discomfort.  She denies any current alcohol or drug use but has a notable alcohol history in her chart.  Does any leg swelling history of blood clots.  Patient does report one episode of nausea and vomiting during this time.  Past Medical History:  Diagnosis Date  . Anxiety   . Blood transfusion without reported diagnosis   . Complication of anesthesia    HR and B/P were elevated with epidural    Patient Active Problem List   Diagnosis Date Noted  . Alcohol dependence with withdrawal with complication (HCC) 04/29/2018  . 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 Substance induced mood disorder (HCC)   . Substance-induced anxiety disorder (HCC)   . Alcohol withdrawal without perceptual disturbances with complication (HCC)   . Cesarean delivery delivered 07/10/2011    Past Surgical History:  Procedure Laterality Date  . CESAREAN SECTION    . LEG SURGERY    . LIPOMA EXCISION  2011  . TUBAL LIGATION Bilateral 2012  . vaginal del x3       OB History    Gravida  6   Para  4   Term  4   Preterm      AB  2   Living  5     SAB  2   TAB      Ectopic      Multiple  1   Live Births    2            Home Medications    Prior to Admission medications   Medication Sig Start Date End Date Taking? Authorizing Provider  hydrOXYzine (ATARAX/VISTARIL) 25 MG tablet Take 1 tablet (25 mg total) by mouth every 6 (six) hours as needed for anxiety. 04/30/18   Money, Gerlene Burdock, FNP    Family History No family history on file.  Social History Social History   Tobacco Use  . Smoking status: Current Every Day Smoker    Packs/day: 0.50    Types: Cigarettes    Last attempt to quit: 10/29/2012    Years since quitting: 5.6  . Smokeless tobacco: Never Used  Substance Use Topics  . Alcohol use: Yes  . Drug use: No     Allergies   Penicillins   Review of Systems Review of Systems  Constitutional: Negative for fever.  Respiratory: Negative for shortness of breath.   Cardiovascular: Positive for chest pain and palpitations. Negative for leg swelling.  Gastrointestinal: Positive for nausea and vomiting. Negative for abdominal pain.  Genitourinary: Negative for dysuria.  Psychiatric/Behavioral: The patient is nervous/anxious.   All other systems reviewed and are  negative.    Physical Exam Updated Vital Signs BP (!) 144/96 (BP Location: Right Arm)   Pulse 82   Temp 98.4 F (36.9 C) (Oral)   Resp 18   Ht 5\' 8"  (1.727 m)   Wt 59 kg   SpO2 100%   BMI 19.77 kg/m   Physical Exam  Constitutional: She is oriented to person, place, and time. She appears well-developed and well-nourished.  Anxious appearing but nontoxic, no acute distress  HENT:  Head: Normocephalic and atraumatic.  Neck: Neck supple.  Cardiovascular: Normal rate, regular rhythm, normal heart sounds and normal pulses.  Pulmonary/Chest: Effort normal. No respiratory distress. She has no wheezes.  Abdominal: Soft. Bowel sounds are normal.  Musculoskeletal:       Right lower leg: She exhibits no tenderness and no edema.       Left lower leg: She exhibits no tenderness and no edema.  Neurological: She  is alert and oriented to person, place, and time.  Skin: Skin is warm and dry.  Psychiatric: She has a normal mood and affect.  Nursing note and vitals reviewed.    ED Treatments / Results  Labs (all labs ordered are listed, but only abnormal results are displayed) Labs Reviewed  BASIC METABOLIC PANEL - Abnormal; Notable for the following components:      Result Value   BUN 5 (*)    All other components within normal limits  CBC - Abnormal; Notable for the following components:   MCV 102.5 (*)    All other components within normal limits  ETHANOL  I-STAT TROPONIN, ED  POC URINE PREG, ED    EKG EKG Interpretation  Date/Time:  Wednesday June 25 2018 23:27:53 EDT Ventricular Rate:  98 PR Interval:  132 QRS Duration: 72 QT Interval:  358 QTC Calculation: 457 R Axis:   71 Text Interpretation:  Normal sinus rhythm Septal infarct , age undetermined Abnormal ECG Confirmed by Ross Marcus ([id number]) on 06/26/2018 12:06:10 AM   Radiology Dg Chest 2 View  Result Date: 06/25/2018 CLINICAL DATA:  Mid chest pain EXAM: CHEST - 2 VIEW COMPARISON:  04/16/2008 FINDINGS: Heart and mediastinal contours are within normal limits. No focal opacities or effusions. No acute bony abnormality. Mild peribronchial thickening. IMPRESSION: Mild bronchitic changes. Electronically Signed   By: Charlett Nose M.D.   On: 06/25/2018 23:43    Procedures Procedures (including critical care time)  Medications Ordered in ED Medications  aspirin chewable tablet 324 mg (324 mg Oral Given 06/26/18 0126)  LORazepam (ATIVAN) tablet 1 mg (1 mg Oral Given 06/26/18 0127)     Initial Impression / Assessment and Plan / ED Course  I have reviewed the triage vital signs and the nursing notes.  Pertinent labs & imaging results that were available during my care of the patient were reviewed by me and considered in my medical decision making (see chart for details).     Patient presents with palpitations and chest  pain.  This is in the setting of anxiety.  She is anxious appearing but nontoxic and vital signs are reassuring.  Only risk factor for ACS is hypertension.  EKG shows no evidence of acute ischemia.  Initial troponin is negative.  Patient was given a dose of Ativan.  She is not hypoxic and no risk factors for PE.  Doubt PE.  On recheck, she is resting comfortably.  Complete resolution of symptoms.  Chest x-ray shows no evidence of pneumothorax or pneumonia.  Do not feel at this time  she needs repeat troponin.  This could be anxiety related; however, we will have her follow-up closely with cardiology for further evaluation and possible stress testing.  After history, exam, and medical workup I feel the patient has been appropriately medically screened and is safe for discharge home. Pertinent diagnoses were discussed with the patient. Patient was given return precautions.   Final Clinical Impressions(s) / ED Diagnoses   Final diagnoses:  Atypical chest pain  Palpitations  Anxiety    ED Discharge Orders    None       Shon BatonHorton, Courtney F, MD 06/26/18 [phone]

## 2018-10-21 ENCOUNTER — Other Ambulatory Visit: Payer: Self-pay

## 2018-10-21 ENCOUNTER — Emergency Department (HOSPITAL_COMMUNITY)
Admission: EM | Admit: 2018-10-21 | Discharge: 2018-10-21 | Disposition: A | Discharge status: Home / Self Care | Payer: Self-pay | Attending: Emergency Medicine | Admitting: Emergency Medicine

## 2018-10-21 ENCOUNTER — Emergency Department (HOSPITAL_COMMUNITY): Payer: Self-pay

## 2018-10-21 ENCOUNTER — Encounter (HOSPITAL_COMMUNITY): Payer: Self-pay | Admitting: Emergency Medicine

## 2018-10-21 DIAGNOSIS — M25511 Pain in right shoulder: Secondary | ICD-10-CM | POA: Insufficient documentation

## 2018-10-21 DIAGNOSIS — Y999 Unspecified external cause status: Secondary | ICD-10-CM | POA: Insufficient documentation

## 2018-10-21 DIAGNOSIS — Z23 Encounter for immunization: Secondary | ICD-10-CM | POA: Insufficient documentation

## 2018-10-21 DIAGNOSIS — S0181XA Laceration without foreign body of other part of head, initial encounter: Secondary | ICD-10-CM | POA: Insufficient documentation

## 2018-10-21 DIAGNOSIS — F17210 Nicotine dependence, cigarettes, uncomplicated: Secondary | ICD-10-CM | POA: Insufficient documentation

## 2018-10-21 DIAGNOSIS — Y929 Unspecified place or not applicable: Secondary | ICD-10-CM | POA: Insufficient documentation

## 2018-10-21 DIAGNOSIS — Y939 Activity, unspecified: Secondary | ICD-10-CM | POA: Insufficient documentation

## 2018-10-21 DIAGNOSIS — M25512 Pain in left shoulder: Secondary | ICD-10-CM | POA: Insufficient documentation

## 2018-10-21 LAB — I-STAT BETA HCG BLOOD, ED (MC, WL, AP ONLY): I-stat hCG, quantitative: <5 m[IU]/mL (ref <5)

## 2018-10-21 MED ORDER — LIDOCAINE-EPINEPHRINE (PF) 2 %-1:200000 IJ SOLN
10.0000 mL | Freq: Once | INTRAMUSCULAR | Status: DC
  Filled 2018-10-21: qty 20

## 2018-10-21 MED ORDER — KETOROLAC TROMETHAMINE 30 MG/ML IJ SOLN
30.0000 mg | Freq: Once | INTRAMUSCULAR | Status: DC
  Filled 2018-10-21: qty 1

## 2018-10-21 MED ORDER — LORAZEPAM 2 MG/ML IJ SOLN
0.5000 mg | Freq: Once | INTRAMUSCULAR | Status: AC
  Administered 2018-10-21: 0.5 mg via INTRAVENOUS
  Filled 2018-10-21: qty 1

## 2018-10-21 MED ORDER — LIDOCAINE-EPINEPHRINE-TETRACAINE (LET) SOLUTION
3.0000 mL | Freq: Once | NASAL | Status: AC
  Administered 2018-10-21: 3 mL via TOPICAL
  Filled 2018-10-21: qty 3

## 2018-10-21 MED ORDER — TETANUS-DIPHTH-ACELL PERTUSSIS 5-2.5-18.5 LF-MCG/0.5 IM SUSP
0.5000 mL | Freq: Once | INTRAMUSCULAR | Status: AC
  Administered 2018-10-21: 0.5 mL via INTRAMUSCULAR
  Filled 2018-10-21: qty 0.5

## 2018-10-21 MED ORDER — ACETAMINOPHEN 500 MG PO TABS
1000.0000 mg | ORAL_TABLET | Freq: Once | ORAL | Status: AC
  Administered 2018-10-21: 1000 mg via ORAL
  Filled 2018-10-21: qty 2

## 2018-10-21 MED ORDER — PROCHLORPERAZINE EDISYLATE 10 MG/2ML IJ SOLN
10.0000 mg | Freq: Once | INTRAMUSCULAR | Status: DC
  Filled 2018-10-21: qty 2

## 2018-10-21 MED ORDER — LORAZEPAM 2 MG/ML IJ SOLN
1.0000 mg | Freq: Once | INTRAMUSCULAR | Status: AC
  Administered 2018-10-21: 1 mg via INTRAVENOUS
  Filled 2018-10-21: qty 1

## 2018-10-21 MED ORDER — DIPHENHYDRAMINE HCL 50 MG/ML IJ SOLN
25.0000 mg | Freq: Once | INTRAMUSCULAR | Status: DC
  Filled 2018-10-21: qty 1

## 2018-10-21 MED ORDER — MORPHINE SULFATE (PF) 4 MG/ML IV SOLN
4.0000 mg | Freq: Once | INTRAVENOUS | Status: AC
  Administered 2018-10-21: 4 mg via INTRAVENOUS
  Filled 2018-10-21: qty 1

## 2018-10-21 MED ORDER — SODIUM CHLORIDE 0.9 % IV BOLUS
1000.0000 mL | Freq: Once | INTRAVENOUS | Status: AC
  Administered 2018-10-21: 1000 mL via INTRAVENOUS

## 2018-10-21 NOTE — ED Notes (Signed)
Patient verbalizes understanding of discharge instructions. Opportunity for questioning and answers were provided. Armband removed by staff, pt discharged from ED by wheelchair. Pt educated on wound care and gave supplies. Verbalized understanding

## 2018-10-21 NOTE — ED Notes (Signed)
Pt transported to xray 

## 2018-10-21 NOTE — Discharge Instructions (Signed)
1. Medications: Alternate 600 mg of ibuprofen and [phone] mg of Tylenol every 3 hours as needed for pain. Do not exceed 4000 mg of Tylenol daily.  Take ibuprofen with food to avoid upset stomach issues. 2. Treatment: ice for swelling, keep wound clean with warm soap and water and keep bandage dry, do not submerge in water for 24 hours 3. Follow Up: Please return in 5-7 days to have your stitches to be removed or sooner if you have concerns.  You may also go to urgent care or your primary care physician.  Return to the emergency department sooner if any concerning signs or symptoms develop such as high fevers, severe headaches, persistent vomiting, redness, drainage of pus from the wound, or swelling.   WOUND CARE  Keep area clean and dry for 24 hours. Do not remove bandage, if applied.  After 24 hours, remove bandage and wash wound gently with mild soap and warm water. Reapply a new bandage after cleaning wound, if directed.   Continue daily cleansing with soap and water until stitches/staples are removed.  Do not apply any ointments or creams to the wound while stitches/staples are in place, as this may cause delayed healing. Return if you experience any of the following signs of infection: Swelling, redness, pus drainage, streaking, fever >101.0 F  Return if you experience excessive bleeding that does not stop after 15-20 minutes of constant, firm pressure.
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
## 2018-10-21 NOTE — ED Notes (Signed)
Pt transported to CT ?

## 2018-10-21 NOTE — ED Notes (Signed)
Patient stated she is allergic to Benadryl provider notified. Explained to patient the additional medication and states has anxiety and does not want to take the two other medications compazine or Toradol either but wants something for pain. Provider notified.
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
## 2018-10-21 NOTE — ED Notes (Signed)
Patient ambulatory to bathroom with steady gait at this time 

## 2018-10-23 IMAGING — MR MR LUMBAR SPINE W/O CM
4 of 6 series · 19 of 48 positions shown · non-contrast
Comparison: CT pelvis 03/25/2017.  CT Abdomen and Pelvis 05/13/2012

CLINICAL DATA: 41-year-old female with fall downstairs 3 days ago.
Left L4 transverse process fracture suspected on pelvis CT 3 days
ago. Lumbar back pain. Hand numbness.

EXAM:
MRI LUMBAR SPINE WITHOUT CONTRAST
TECHNIQUE: Multiplanar, multisequence MR imaging of the lumbar spine was
performed. No intravenous contrast was administered.

[Series 2: T2 · sagittal · 4.0mm · 0.55mm/px · 5 of 15 slices shown (1 of 2)]
[im 1/15]
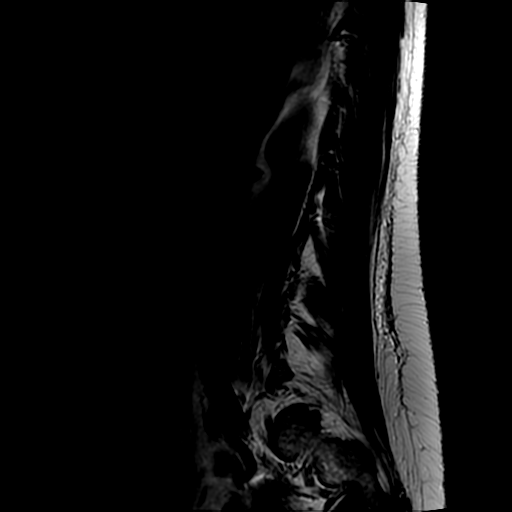
[im 4/15]
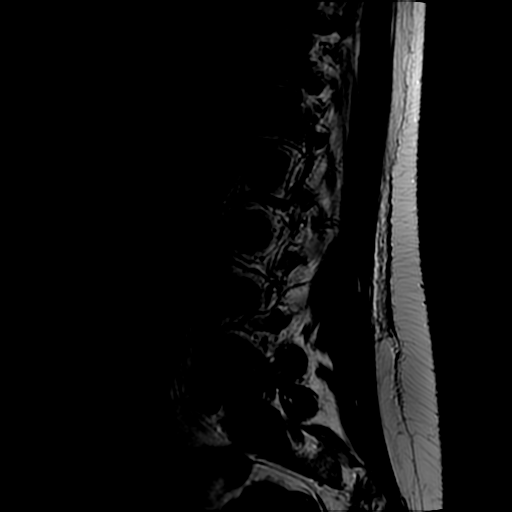
[im 8/15]
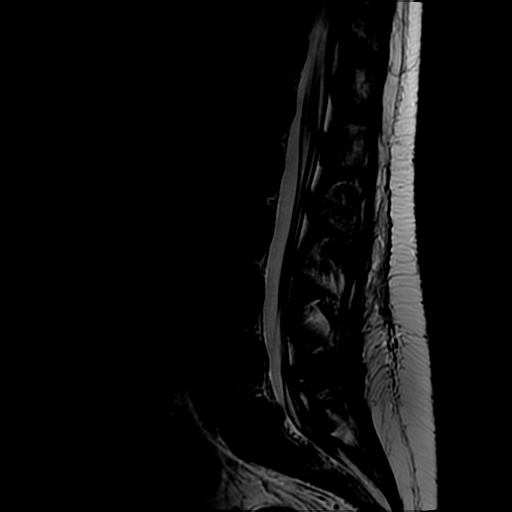
[im 11/15]
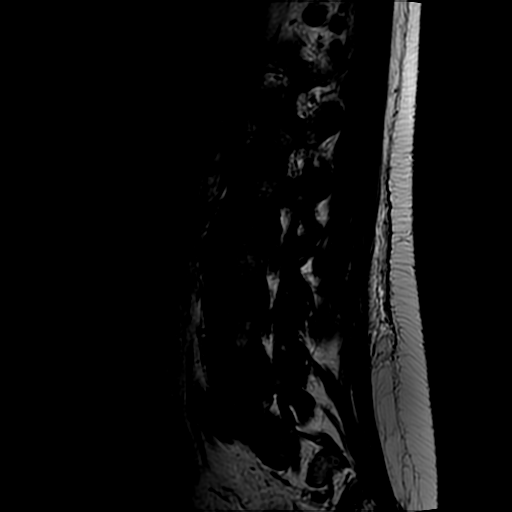
[im 15/15]
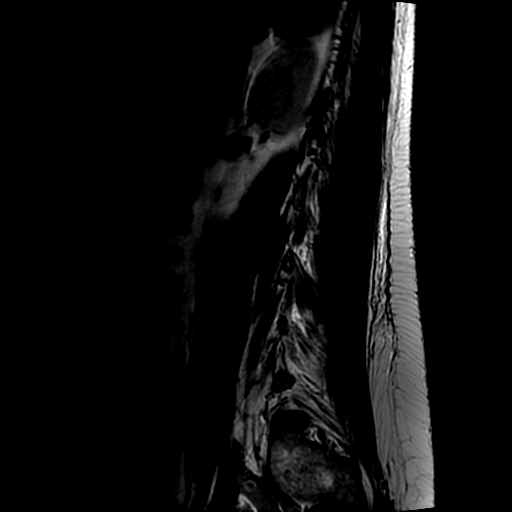

[Series 3: T1 · sagittal · 4.0mm · 0.55mm/px · 3 of 15 slices shown (1 of 2)]
[im 1/15]
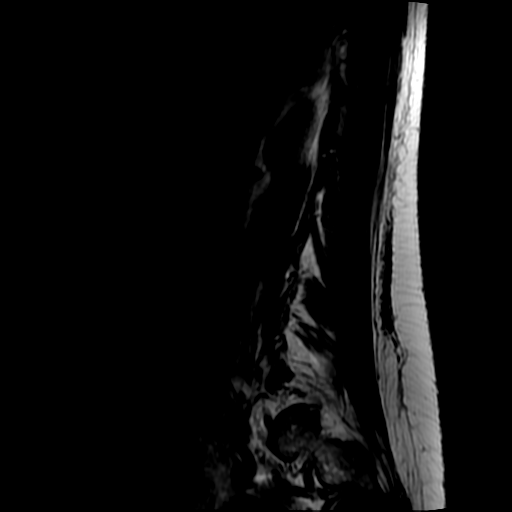
[im 8/15]
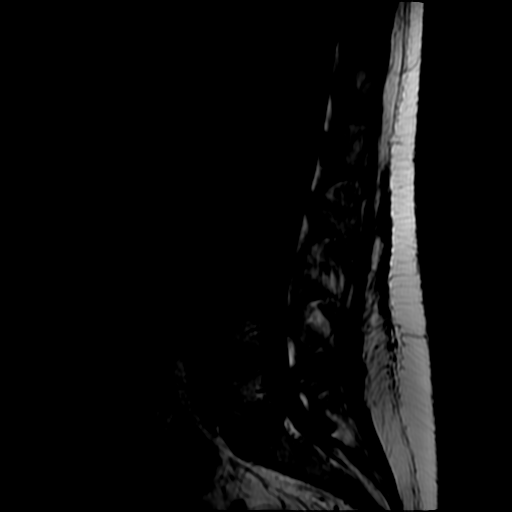
[im 15/15]
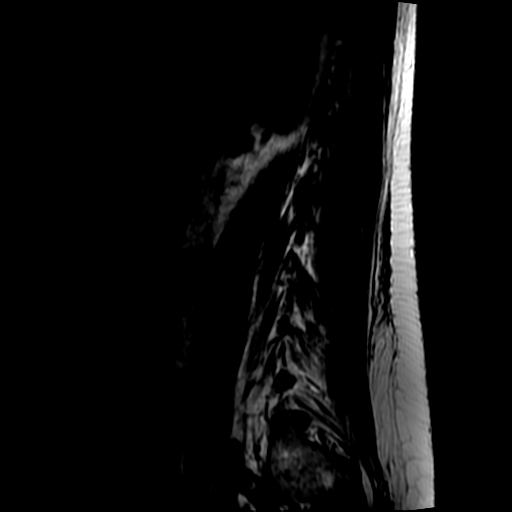

[Series 4: T2 · axial · 4.0mm · 0.39mm/px · z∈[-108,+36]mm · 8 of 34 slices shown (2 of 2)]
[im 1/34]
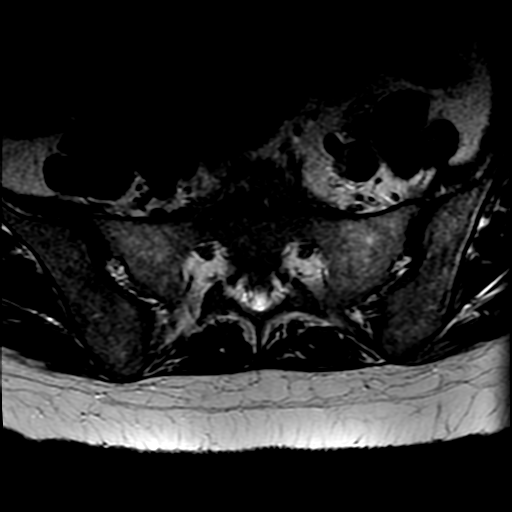
[im 6/34]
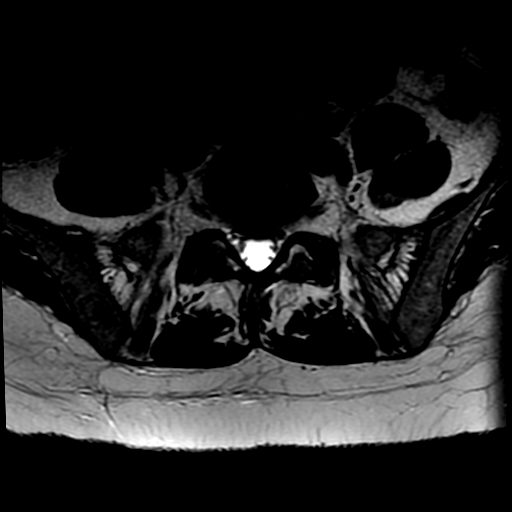
[im 12/34]
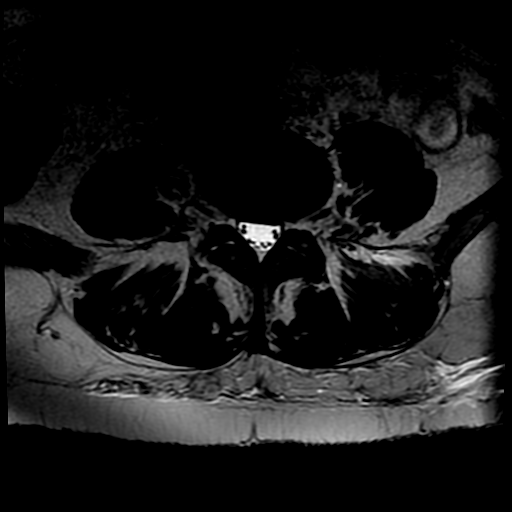
[im 14/34]
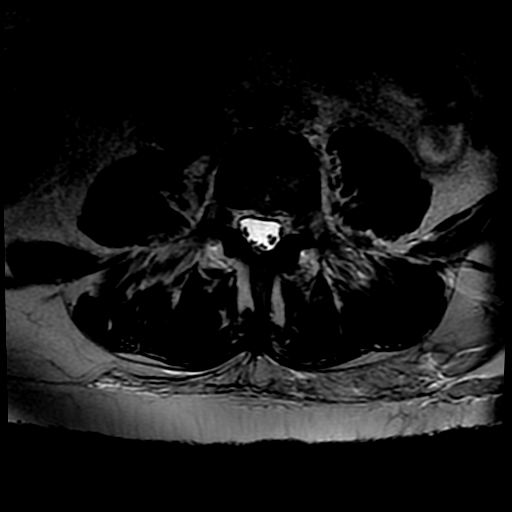
[im 17/34]
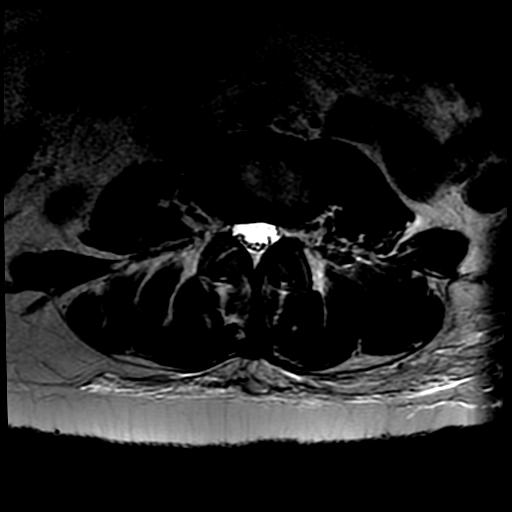
[im 20/34]
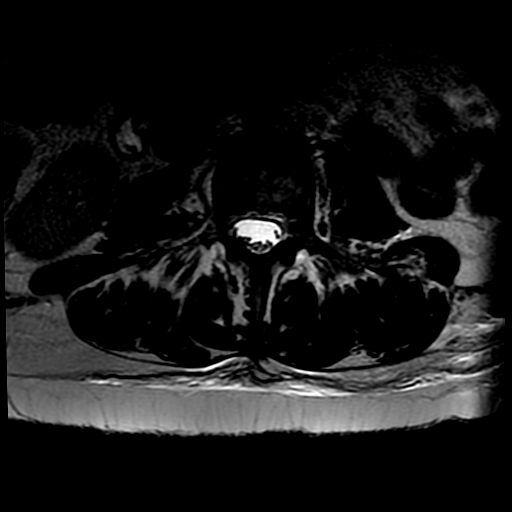
[im 23/34]
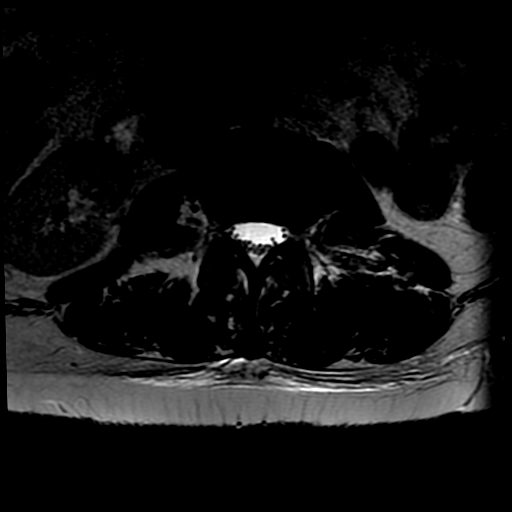
[im 28/34]
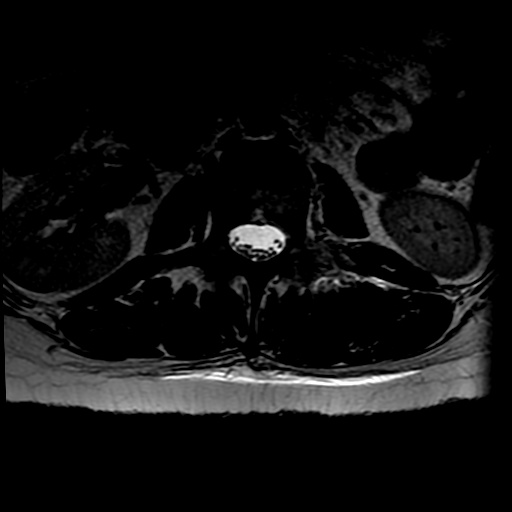

[Series 7: T1 · axial · 4.0mm · 0.39mm/px · z∈[-83,+36]mm · 3 of 34 slices shown (2 of 2)]
[im 6/34]
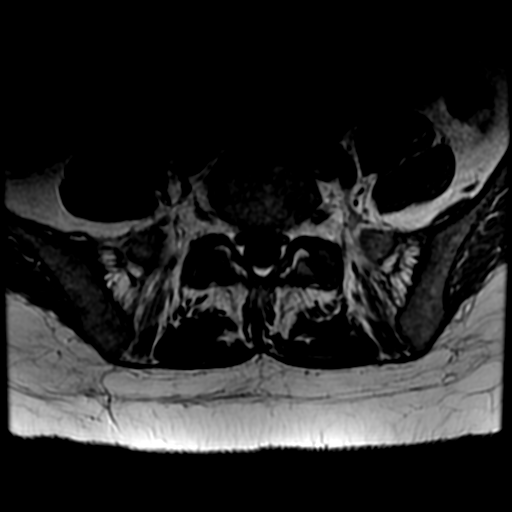
[im 17/34]
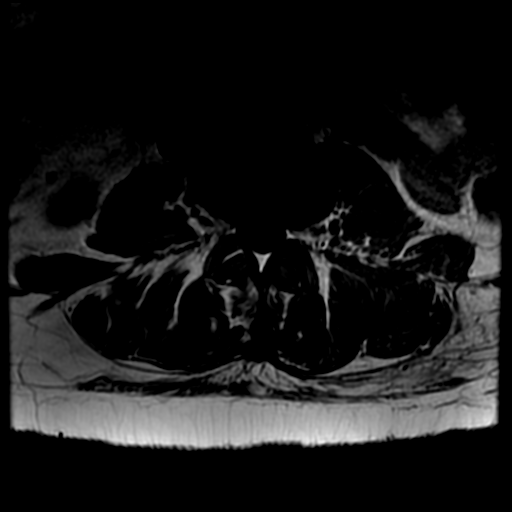
[im 28/34]
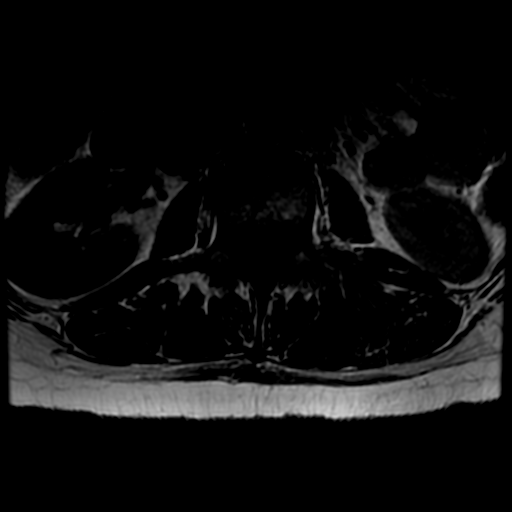

[19 of 48 positions shown; findings below may reference images not displayed]

FINDINGS: Segmentation:  Normal as seen on the 5942 CT.

Alignment: Stable and normal lumbar vertebral height and alignment
compared to 5942.

Vertebrae: Mild marrow edema identified in the left L2, L3, and L4
transverse processes as seen on series 5, image 14. Mild adjacent
soft tissue edema. These transverse processes appear nondisplaced
(series 7, image 6). No other marrow edema or acute osseous
abnormality. Negative visible sacrum and SI joints.

Conus medullaris: Extends to the L1 level and appears normal.

Paraspinal and other soft tissues: Left lumbar paraspinal soft
tissue edema as seen on series 5, images 14 and 15 and stated above.
Otherwise negative visualized abdominal viscera and paraspinal soft
tissues.

Disc levels:

Normal except for mild disc desiccation and small central posterior
annular fissures of the disc at L4-L5 and L5-S1. No disc herniation,
spinal stenosis or neural impingement.
IMPRESSION: 1. Evidence of nondisplaced posttraumatic fractures of the left L2,
L3, and L4 transverse processes. These are stable fractures.
Associated adjacent soft tissue injury.
2. No other acute osseous abnormality identified.
3. Mild lumbar disc degeneration at L4-L5 and L5-S1. No disc
herniation, spinal stenosis or neural impingement.

## 2019-02-17 ENCOUNTER — Encounter (HOSPITAL_COMMUNITY): Payer: Self-pay

## 2019-02-17 ENCOUNTER — Emergency Department (HOSPITAL_COMMUNITY): Payer: Self-pay

## 2019-02-17 ENCOUNTER — Emergency Department (HOSPITAL_COMMUNITY)
Admission: EM | Admit: 2019-02-17 | Discharge: 2019-02-18 | Disposition: A | Discharge status: Home / Self Care | Payer: Self-pay | Attending: Emergency Medicine | Admitting: Emergency Medicine

## 2019-02-17 DIAGNOSIS — R072 Precordial pain: Secondary | ICD-10-CM | POA: Insufficient documentation

## 2019-02-17 DIAGNOSIS — F17210 Nicotine dependence, cigarettes, uncomplicated: Secondary | ICD-10-CM | POA: Insufficient documentation

## 2019-02-17 LAB — CBC WITH DIFFERENTIAL/PLATELET
Abs Immature Granulocytes: 0 10*3/uL (ref 0.00–0.07)
Basophils Absolute: 0.1 10*3/uL (ref 0.0–0.1)
Basophils Relative: 1 %
Eosinophils Absolute: 0.1 10*3/uL (ref 0.0–0.5)
Eosinophils Relative: 1 %
HCT: 34.6 % — ABNORMAL LOW (ref 36.0–46.0)
Hemoglobin: 11.9 g/dL — ABNORMAL LOW (ref 12.0–15.0)
Immature Granulocytes: 0 %
Lymphocytes Relative: 31 %
Lymphs Abs: 1.9 10*3/uL (ref 0.7–4.0)
MCH: 32.5 pg (ref 26.0–34.0)
MCHC: 34.4 g/dL (ref 30.0–36.0)
MCV: 94.5 fL (ref 80.0–100.0)
Monocytes Absolute: 0.4 10*3/uL (ref 0.1–1.0)
Monocytes Relative: 7 %
Neutro Abs: 3.6 10*3/uL (ref 1.7–7.7)
Neutrophils Relative %: 60 %
Platelets: 253 10*3/uL (ref 150–400)
RBC: 3.66 MIL/uL — ABNORMAL LOW (ref 3.87–5.11)
RDW: 11.8 % (ref 11.5–15.5)
WBC: 6 10*3/uL (ref 4.0–10.5)
nRBC: 0 % (ref 0.0–0.2)

## 2019-02-17 LAB — COMPREHENSIVE METABOLIC PANEL
ALT: 13 U/L (ref 0–44)
AST: 16 U/L (ref 15–41)
Albumin: 4.2 g/dL (ref 3.5–5.0)
Alkaline Phosphatase: 43 U/L (ref 38–126)
Anion gap: 9 (ref 5–15)
BUN: 7 mg/dL (ref 6–20)
CO2: 24 mmol/L (ref 22–32)
Calcium: 9.1 mg/dL (ref 8.9–10.3)
Chloride: 105 mmol/L (ref 98–111)
Creatinine, Ser: 0.54 mg/dL (ref 0.44–1.00)
GFR calc Af Amer: >60 mL/min (ref >60)
GFR calc non Af Amer: >60 mL/min (ref >60)
Glucose, Bld: 98 mg/dL (ref 70–99)
Potassium: 3.9 mmol/L (ref 3.5–5.1)
Sodium: 138 mmol/L (ref 135–145)
Total Bilirubin: 0.6 mg/dL (ref 0.3–1.2)
Total Protein: 7.1 g/dL (ref 6.5–8.1)

## 2019-02-17 LAB — TROPONIN I: Troponin I: <0.03 ng/mL (ref <0.03)

## 2019-02-17 LAB — D-DIMER, QUANTITATIVE (NOT AT ARMC): D-Dimer, Quant: <0.27 ug/mL-FEU (ref 0.00–0.50)

## 2019-02-17 NOTE — ED Provider Notes (Signed)
Emergency Department Provider Note   I have reviewed the triage vital signs and the nursing notes.   HISTORY  Chief Complaint Chest Pain   HPI Miranda Wood is a 43 y.o. female with PMH of HTN and substance induced mood disorder presents to the emergency department with substernal chest tightness which began approximately 45 minutes prior to ED presentation.  Patient has a dull discomfort into her left arm.  Denies any diaphoresis.  She does feel somewhat improved at this time. Mild SOB noted slightly worse with inspiration. Patient did drink 1 beer this evening but denies any illicit drug use. Denies fever, chills, cough, or known sick contact.   Past Medical History:  Diagnosis Date  . Anxiety   . Blood transfusion without reported diagnosis   . Complication of anesthesia    HR and B/P were elevated with epidural    Patient Active Problem List   Diagnosis Date Noted  . Alcohol dependence with withdrawal with complication (HCC) 04/29/2018  . 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 Substance induced mood disorder (HCC)   . Substance-induced anxiety disorder (HCC)   . Alcohol withdrawal without perceptual disturbances with complication (HCC)   . Cesarean delivery delivered 07/10/2011    Past Surgical History:  Procedure Laterality Date  . CESAREAN SECTION    . LEG SURGERY    . LIPOMA EXCISION  2011  . TUBAL LIGATION Bilateral 2012  . vaginal del x3      Allergies Penicillins and Benadryl [diphenhydramine]  History reviewed. No pertinent family history.  Social History Social History   Tobacco Use  . Smoking status: Current Every Day Smoker    Packs/day: 0.50    Types: Cigarettes    Last attempt to quit: 10/29/2012    Years since quitting: 6.3  . Smokeless tobacco: Never Used  Substance Use Topics  . Alcohol use: Yes  . Drug use: No    Review of Systems  Constitutional: No fever/chills Eyes:  No visual changes. ENT: No sore throat. Cardiovascular: Positive chest pain. Respiratory: Positive shortness of breath. Gastrointestinal: No abdominal pain.  No nausea, no vomiting.  No diarrhea.  No constipation. Genitourinary: Negative for dysuria. Musculoskeletal: Negative for back pain. Skin: Negative for rash. Neurological: Negative for headaches, focal weakness or numbness.  10-point ROS otherwise negative.  ____________________________________________   PHYSICAL EXAM:  VITAL SIGNS: ED Triage Vitals  Enc Vitals Group     BP 02/17/19 2049 (!) 127/100     Pulse Rate 02/17/19 2049 100     Resp 02/17/19 2049 18     Temp 02/17/19 2049 98.5 F (36.9 C)     Temp Source 02/17/19 2049 Oral     SpO2 02/17/19 2049 97 %     Pain Score 02/17/19 2046 7   Constitutional: Alert and oriented. Well appearing and in no acute distress. Eyes: Conjunctivae are normal.  Head: Atraumatic. Nose: No congestion/rhinnorhea. Mouth/Throat: Mucous membranes are moist.  Neck: No stridor.   Cardiovascular: Tachycardia. Good peripheral circulation. Grossly normal heart sounds.   Respiratory: Normal respiratory effort.  No retractions. Lungs CTAB. Gastrointestinal: No distention.  Musculoskeletal: No gross deformities of extremities. Neurologic:  Normal speech and language.  Skin:  Skin is warm, dry and intact.   ____________________________________________   LABS (all labs ordered are listed, but only abnormal results are displayed)  Labs Reviewed  CBC WITH DIFFERENTIAL/PLATELET - Abnormal; Notable for the following components:      Result Value  RBC 3.66 (*)    Hemoglobin 11.9 (*)    HCT 34.6 (*)    All other components within normal limits  COMPREHENSIVE METABOLIC PANEL  TROPONIN I  D-DIMER, QUANTITATIVE (NOT AT [facility])  TROPONIN I  I-STAT BETA HCG BLOOD, ED ([facility], [facility], [facility] ONLY)   ____________________________________________  EKG   EKG Interpretation  Date/Time:  Tuesday February 17 2019 20:51:49 EDT Ventricular Rate:  99 PR Interval:    QRS Duration: 76 QT Interval:  359 QTC Calculation: 461 R Axis:   73 Text Interpretation:  Sinus rhythm Baseline wander in lead(s) V6 No STEMI.  Confirmed by Alona Bene [phone]) on 02/17/2019 8:53:28 PM       ____________________________________________  RADIOLOGY  Dg Chest Portable 1 View  Result Date: 02/17/2019 CLINICAL DATA:  Chest pain EXAM: PORTABLE CHEST 1 VIEW COMPARISON:  06/25/2018 FINDINGS: The heart size and mediastinal contours are within normal limits. Both lungs are clear. The visualized skeletal structures are unremarkable. IMPRESSION: No active disease. Electronically Signed   By: Deatra Robinson M.D.   On: 02/17/2019 21:20    ____________________________________________   PROCEDURES  Procedure(s) performed:   Procedures  None  ____________________________________________   INITIAL IMPRESSION / ASSESSMENT AND PLAN / ED COURSE  Pertinent labs & imaging results that were available during my care of the patient were reviewed by me and considered in my medical decision making (see chart for details).   Patient presents to the emergency department for evaluation of chest pain which began 45 minutes prior to ED presentation.  Patient has history of high blood pressure but no other ACS risk factors.  Low risk by HEART score. Low risk by [place] for PE but tachycardia here. Will send d-dimer. Following CXR. No clinical indication that this is infectious in nature.   Labs and CXR reviewed. Plan for repeat troponin due at midnight. Care transferred to Dr. Elesa Massed. Plan for discharge if troponin is negative.  ____________________________________________  FINAL CLINICAL IMPRESSION(S) / ED DIAGNOSES  Final diagnoses:  Precordial chest pain    Note:  This document was prepared using Dragon voice recognition software and may include unintentional dictation errors.  Alona Bene, MD Emergency Medicine    [name],  Arlyss Repress, MD 02/17/19 2329

## 2019-02-17 NOTE — ED Triage Notes (Signed)
Pt complains of pressure like chest pain for 45 minutes, she states that she's a little nauseated but hasn't vomited

## 2019-02-17 NOTE — Discharge Instructions (Signed)

## 2019-02-18 LAB — TROPONIN I: Troponin I: <0.03 ng/mL (ref <0.03)

## 2019-02-18 NOTE — ED Provider Notes (Signed)
1:05 AM  Assumed care from Dr. Jacqulyn Bath.  Patient is a 43 year old female who presented to the emergency department with chest pain that started prior to arrival.  First troponin negative.  Plan was to repeat second troponin at midnight.  If this was negative, patient will be discharged home with outpatient follow-up.  Second troponin has come back negative.  Patient reports she is feeling better and hemodynamically stable.  She is comfortable with plan for discharge home with close outpatient follow-up.  We discussed return precautions.   I reviewed all nursing notes, vitals, pertinent previous records, EKGs, lab and urine results, imaging (as available).    Hannan Hutmacher, Layla Maw, DO 02/18/19 0110

## 2019-10-04 ENCOUNTER — Emergency Department (HOSPITAL_COMMUNITY)
Admission: EM | Admit: 2019-10-04 | Discharge: 2019-10-04 | Disposition: A | Discharge status: Home / Self Care | Payer: Self-pay | Attending: Emergency Medicine | Admitting: Emergency Medicine

## 2019-10-04 ENCOUNTER — Other Ambulatory Visit: Payer: Self-pay

## 2019-10-04 DIAGNOSIS — Z5321 Procedure and treatment not carried out due to patient leaving prior to being seen by health care provider: Secondary | ICD-10-CM | POA: Insufficient documentation

## 2019-10-04 DIAGNOSIS — N63 Unspecified lump in unspecified breast: Secondary | ICD-10-CM | POA: Insufficient documentation

## 2019-10-04 NOTE — ED Notes (Signed)
Fiance Guadelupe Sabin to be called when pt in room. [phone]

## 2019-10-04 NOTE — ED Notes (Signed)
Pt called multiple times w/no answer. Called number given by fiance, person answering phone reports its the wrong number. He states he is not Guadelupe Sabin

## 2019-10-04 NOTE — ED Notes (Signed)
Pt called for Triage (twice). No response.

## 2020-03-19 ENCOUNTER — Emergency Department (HOSPITAL_COMMUNITY)
Admission: EM | Admit: 2020-03-19 | Discharge: 2020-03-19 | Disposition: A | Discharge status: Home / Self Care | Payer: Self-pay | Attending: Emergency Medicine | Admitting: Emergency Medicine

## 2020-03-19 ENCOUNTER — Encounter (HOSPITAL_COMMUNITY): Payer: Self-pay | Admitting: Emergency Medicine

## 2020-03-19 ENCOUNTER — Other Ambulatory Visit: Payer: Self-pay

## 2020-03-19 DIAGNOSIS — F419 Anxiety disorder, unspecified: Secondary | ICD-10-CM

## 2020-03-19 DIAGNOSIS — F17210 Nicotine dependence, cigarettes, uncomplicated: Secondary | ICD-10-CM | POA: Insufficient documentation

## 2020-03-19 DIAGNOSIS — N3001 Acute cystitis with hematuria: Secondary | ICD-10-CM

## 2020-03-19 LAB — URINALYSIS, ROUTINE W REFLEX MICROSCOPIC
Bilirubin Urine: NEGATIVE
Glucose, UA: NEGATIVE mg/dL
Ketones, ur: NEGATIVE mg/dL
Nitrite: POSITIVE — AB
Protein, ur: 100 mg/dL — AB
RBC / HPF: >50 RBC/hpf — ABNORMAL HIGH (ref 0–5)
Specific Gravity, Urine: 1.019 (ref 1.005–1.030)
WBC, UA: >50 WBC/hpf — ABNORMAL HIGH (ref 0–5)
pH: 6 (ref 5.0–8.0)

## 2020-03-19 LAB — POC URINE PREG, ED: Preg Test, Ur: NEGATIVE

## 2020-03-19 MED ORDER — FOSFOMYCIN TROMETHAMINE 3 G PO PACK
3.0000 g | PACK | Freq: Once | ORAL | Status: AC
  Administered 2020-03-19: 3 g via ORAL
  Filled 2020-03-19: qty 3

## 2020-03-19 MED ORDER — PHENAZOPYRIDINE HCL 100 MG PO TABS
95.0000 mg | ORAL_TABLET | Freq: Once | ORAL | Status: AC
  Administered 2020-03-19: 100 mg via ORAL
  Filled 2020-03-19: qty 1

## 2020-03-19 MED ORDER — HYDROXYZINE HCL 25 MG PO TABS: 25.0000 mg | ORAL_TABLET | Freq: Three times a day (TID) | ORAL | 1 refills | Status: DC | PRN

## 2020-03-19 NOTE — ED Provider Notes (Signed)
[facility] EMERGENCY DEPARTMENT Provider Note   CSN: [id number] Arrival date & time: 03/19/20  1816     History Chief Complaint  Patient presents with  . Hematuria  . Dysuria    Miranda Wood is a 44 y.o. female   Dysuria Pain quality:  Burning Pain severity:  Moderate Onset quality:  Gradual Duration:  1 week Timing:  Constant Progression:  Worsening Chronicity:  Recurrent Recent urinary tract infections: no   Relieved by:  Nothing Worsened by:  Nothing Ineffective treatments:  None tried Urinary symptoms: discolored urine, frequent urination and hematuria   Associated symptoms: abdominal pain   Risk factors: recurrent urinary tract infections and sexually active   Risk factors: no hx of pyelonephritis, no hx of urolithiasis, no kidney transplant, not pregnant, no renal cysts, no renal disease, no sexually transmitted infections, no single kidney and no urinary catheter        Past Medical History:  Diagnosis Date  . Anxiety   . Blood transfusion without reported diagnosis   . Complication of anesthesia    HR and B/P were elevated with epidural    Patient Active Problem List   Diagnosis Date Noted  . Alcohol dependence with withdrawal with complication (HCC) 04/29/2018  . 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 Substance induced mood disorder (HCC)   . Substance-induced anxiety disorder (HCC)   . Alcohol withdrawal without perceptual disturbances with complication (HCC)   . Cesarean delivery delivered 07/10/2011    Past Surgical History:  Procedure Laterality Date  . CESAREAN SECTION    . LEG SURGERY    . LIPOMA EXCISION  2011  . TUBAL LIGATION Bilateral 2012  . vaginal del x3       OB History    Gravida  6   Para  4   Term  4   Preterm      AB  2   Living  5     SAB  2   TAB      Ectopic      Multiple  1   Live Births  2           No family  history on file.  Social History   Tobacco Use  . Smoking status: Current Every Day Smoker    Packs/day: 0.50    Types: Cigarettes    Last attempt to quit: 10/29/2012    Years since quitting: 7.3  . Smokeless tobacco: Never Used  Substance Use Topics  . Alcohol use: Yes  . Drug use: No    Home Medications Prior to Admission medications   Medication Sig Start Date End Date Taking? Authorizing Provider  hydrOXYzine (ATARAX/VISTARIL) 25 MG tablet Take 1 tablet (25 mg total) by mouth every 6 (six) hours as needed for anxiety. 04/30/18   Money, Gerlene Burdock, FNP    Allergies    Penicillins and Benadryl [diphenhydramine]  Review of Systems   Review of Systems  Gastrointestinal: Positive for abdominal pain.  Genitourinary: Positive for dysuria.    Physical Exam Updated Vital Signs BP (!) 130/94 (BP Location: Left Arm)   Pulse (!) 104   Temp 99 F (37.2 C) (Oral)   Resp 14   Ht 5\' 8"  (1.727 m)   Wt 61.2 kg   SpO2 97%   BMI 20.53 kg/m   Physical Exam Vitals and nursing note reviewed.  Constitutional:      General: She is not in acute  distress.    Appearance: She is well-developed. She is not diaphoretic.  HENT:     Head: Normocephalic and atraumatic.  Eyes:     General: No scleral icterus.    Conjunctiva/sclera: Conjunctivae normal.  Cardiovascular:     Rate and Rhythm: Normal rate and regular rhythm.     Heart sounds: Normal heart sounds. No murmur. No friction rub. No gallop.   Pulmonary:     Effort: Pulmonary effort is normal. No respiratory distress.     Breath sounds: Normal breath sounds.  Abdominal:     General: Bowel sounds are normal. There is no distension.     Palpations: Abdomen is soft. There is no mass.     Tenderness: There is no abdominal tenderness. There is no right CVA tenderness, left CVA tenderness or guarding.  Musculoskeletal:     Cervical back: Normal range of motion.  Skin:    General: Skin is warm and dry.  Neurological:     Mental Status:  She is alert and oriented to person, place, and time.  Psychiatric:        Behavior: Behavior normal.     ED Results / Procedures / Treatments   Labs (all labs ordered are listed, but only abnormal results are displayed) Labs Reviewed  URINALYSIS, ROUTINE W REFLEX MICROSCOPIC - Abnormal; Notable for the following components:      Result Value   Color, Urine AMBER (*)    APPearance CLOUDY (*)    Hgb urine dipstick LARGE (*)    Protein, ur 100 (*)    Nitrite POSITIVE (*)    Leukocytes,Ua MODERATE (*)    RBC / HPF >50 (*)    WBC, UA >50 (*)    Bacteria, UA MANY (*)    All other components within normal limits  POC URINE PREG, ED    EKG None  Radiology No results found.  Procedures Procedures (including critical care time)  Medications Ordered in ED Medications  fosfomycin (MONUROL) packet 3 g (has no administration in time range)  phenazopyridine (PYRIDIUM) tablet 100 mg (has no administration in time range)    ED Course  I have reviewed the triage vital signs and the nursing notes.  Pertinent labs & imaging results that were available during my care of the patient were reviewed by me and considered in my medical decision making (see chart for details).    MDM Rules/Calculators/A&P                     Patient here with urinary tract infection. She does not have any physical exam findings concerning for pyelonephritis.  She is afebrile and hemodynamically stable.  I personally viewed the patient's lab results which show negative pregnancy test and obvious urinary tract infection.  We will treat here with a single dose of fosfomycin.  I discussed return precautions with the patient.  Patient also states that she is having some anxiety.  She used to take Inderal prescribed by Raechel Chute at the [facility] however currently the [facility] is not offering any medical services because of the coronavirus.  I have offered her hydroxyzine which she states worked for her in the past.  I  discussed return precautions.  She appears appropriate for discharge at this time    Final Clinical Impression(s) / ED Diagnoses Final diagnoses:  None    Rx / DC Orders ED Discharge Orders    None       Margarita Mail, PA-C 03/19/20 2027  Virgina Norfolk, DO 03/19/20 2253

## 2020-03-19 NOTE — ED Triage Notes (Signed)
C/o hematuria and dysuria since yesterday.

## 2020-03-19 NOTE — Discharge Instructions (Addendum)
Contact a health care provider if:  Your symptoms do not get better after 1-2 days.  Your symptoms go away and then return.  Get help right away if you have:  Severe pain in your back or your lower abdomen.  A fever.  Nausea or vomiting.

## 2020-12-30 ENCOUNTER — Encounter (HOSPITAL_COMMUNITY): Payer: Self-pay | Admitting: Internal Medicine

## 2020-12-30 ENCOUNTER — Inpatient Hospital Stay (HOSPITAL_COMMUNITY)
Admission: EM | Admit: 2020-12-30 | Discharge: 2020-12-31 | DRG: 894 | Discharge status: Against Medical Advice | Payer: Self-pay | Attending: Family Medicine | Admitting: Family Medicine

## 2020-12-30 ENCOUNTER — Emergency Department (HOSPITAL_COMMUNITY): Payer: Self-pay

## 2020-12-30 DIAGNOSIS — K2920 Alcoholic gastritis without bleeding: Secondary | ICD-10-CM

## 2020-12-30 DIAGNOSIS — F10231 Alcohol dependence with withdrawal delirium: Principal | ICD-10-CM | POA: Diagnosis present

## 2020-12-30 DIAGNOSIS — L089 Local infection of the skin and subcutaneous tissue, unspecified: Secondary | ICD-10-CM | POA: Diagnosis present

## 2020-12-30 DIAGNOSIS — Z880 Allergy status to penicillin: Secondary | ICD-10-CM

## 2020-12-30 DIAGNOSIS — D696 Thrombocytopenia, unspecified: Secondary | ICD-10-CM

## 2020-12-30 DIAGNOSIS — L723 Sebaceous cyst: Secondary | ICD-10-CM | POA: Diagnosis present

## 2020-12-30 DIAGNOSIS — F10939 Alcohol use, unspecified with withdrawal, unspecified: Secondary | ICD-10-CM

## 2020-12-30 DIAGNOSIS — F10239 Alcohol dependence with withdrawal, unspecified: Secondary | ICD-10-CM

## 2020-12-30 DIAGNOSIS — D6959 Other secondary thrombocytopenia: Secondary | ICD-10-CM | POA: Diagnosis present

## 2020-12-30 DIAGNOSIS — R112 Nausea with vomiting, unspecified: Secondary | ICD-10-CM

## 2020-12-30 DIAGNOSIS — Z20822 Contact with and (suspected) exposure to covid-19: Secondary | ICD-10-CM | POA: Diagnosis present

## 2020-12-30 DIAGNOSIS — Z888 Allergy status to other drugs, medicaments and biological substances status: Secondary | ICD-10-CM

## 2020-12-30 DIAGNOSIS — R569 Unspecified convulsions: Secondary | ICD-10-CM

## 2020-12-30 DIAGNOSIS — F10230 Alcohol dependence with withdrawal, uncomplicated: Principal | ICD-10-CM

## 2020-12-30 DIAGNOSIS — F1020 Alcohol dependence, uncomplicated: Secondary | ICD-10-CM

## 2020-12-30 DIAGNOSIS — L02222 Furuncle of back [any part, except buttock]: Secondary | ICD-10-CM | POA: Diagnosis present

## 2020-12-30 DIAGNOSIS — N390 Urinary tract infection, site not specified: Secondary | ICD-10-CM | POA: Diagnosis present

## 2020-12-30 DIAGNOSIS — F17210 Nicotine dependence, cigarettes, uncomplicated: Secondary | ICD-10-CM | POA: Diagnosis present

## 2020-12-30 DIAGNOSIS — Z720 Tobacco use: Secondary | ICD-10-CM

## 2020-12-30 DIAGNOSIS — L02232 Carbuncle of back [any part, except buttock]: Secondary | ICD-10-CM | POA: Diagnosis present

## 2020-12-30 DIAGNOSIS — F10930 Alcohol use, unspecified with withdrawal, uncomplicated: Secondary | ICD-10-CM

## 2020-12-30 DIAGNOSIS — Z5329 Procedure and treatment not carried out because of patient's decision for other reasons: Secondary | ICD-10-CM | POA: Diagnosis present

## 2020-12-30 DIAGNOSIS — R8281 Pyuria: Secondary | ICD-10-CM | POA: Diagnosis present

## 2020-12-30 DIAGNOSIS — F10931 Alcohol use, unspecified with withdrawal delirium: Secondary | ICD-10-CM | POA: Diagnosis present

## 2020-12-30 LAB — I-STAT BETA HCG BLOOD, ED (MC, WL, AP ONLY): I-stat hCG, quantitative: <5 m[IU]/mL (ref <5)

## 2020-12-30 LAB — CBC
HCT: 41.9 % (ref 36.0–46.0)
Hemoglobin: 13.7 g/dL (ref 12.0–15.0)
MCH: 35.7 pg — ABNORMAL HIGH (ref 26.0–34.0)
MCHC: 32.7 g/dL (ref 30.0–36.0)
MCV: 109.1 fL — ABNORMAL HIGH (ref 80.0–100.0)
Platelets: 107 10*3/uL — ABNORMAL LOW (ref 150–400)
RBC: 3.84 MIL/uL — ABNORMAL LOW (ref 3.87–5.11)
RDW: 13.2 % (ref 11.5–15.5)
WBC: 5.6 10*3/uL (ref 4.0–10.5)
nRBC: 0 % (ref 0.0–0.2)

## 2020-12-30 LAB — LIPASE, BLOOD: Lipase: 48 U/L (ref 11–51)

## 2020-12-30 LAB — MAGNESIUM: Magnesium: 1.6 mg/dL — ABNORMAL LOW (ref 1.7–2.4)

## 2020-12-30 LAB — COMPREHENSIVE METABOLIC PANEL
ALT: 26 U/L (ref 0–44)
AST: 94 U/L — ABNORMAL HIGH (ref 15–41)
Albumin: 3.8 g/dL (ref 3.5–5.0)
Alkaline Phosphatase: 109 U/L (ref 38–126)
Anion gap: 17 — ABNORMAL HIGH (ref 5–15)
BUN: 7 mg/dL (ref 6–20)
CO2: 23 mmol/L (ref 22–32)
Calcium: 9.8 mg/dL (ref 8.9–10.3)
Chloride: 95 mmol/L — ABNORMAL LOW (ref 98–111)
Creatinine, Ser: 0.66 mg/dL (ref 0.44–1.00)
GFR, Estimated: >60 mL/min (ref >60)
Glucose, Bld: 172 mg/dL — ABNORMAL HIGH (ref 70–99)
Potassium: 3.6 mmol/L (ref 3.5–5.1)
Sodium: 135 mmol/L (ref 135–145)
Total Bilirubin: 1.4 mg/dL — ABNORMAL HIGH (ref 0.3–1.2)
Total Protein: 8.3 g/dL — ABNORMAL HIGH (ref 6.5–8.1)

## 2020-12-30 LAB — ETHANOL: Alcohol, Ethyl (B): <10 mg/dL (ref <10)

## 2020-12-30 MED ORDER — LORAZEPAM 2 MG/ML IJ SOLN: 0.0000 mg | Freq: Two times a day (BID) | INTRAMUSCULAR | Status: DC

## 2020-12-30 MED ORDER — ACETAMINOPHEN 325 MG PO TABS
650.0000 mg | ORAL_TABLET | Freq: Four times a day (QID) | ORAL | Status: DC | PRN
  Administered 2020-12-30 – 2020-12-31 (×2): 650 mg via ORAL
  Filled 2020-12-30 (×2): qty 2

## 2020-12-30 MED ORDER — ADULT MULTIVITAMIN W/MINERALS CH
1.0000 | ORAL_TABLET | Freq: Every day | ORAL | Status: DC
  Administered 2020-12-30 – 2020-12-31 (×2): 1 via ORAL
  Filled 2020-12-30 (×2): qty 1

## 2020-12-30 MED ORDER — LORAZEPAM 1 MG PO TABS
1.0000 mg | ORAL_TABLET | ORAL | Status: DC | PRN
Start: 2020-12-30 — End: 2020-12-31
  Administered 2020-12-30 – 2020-12-31 (×3): 2 mg via ORAL
  Filled 2020-12-30 (×3): qty 2

## 2020-12-30 MED ORDER — VANCOMYCIN HCL 1.25 G IV SOLR
1250.0000 mg | Freq: Two times a day (BID) | INTRAVENOUS | Status: DC
  Administered 2020-12-31: 1250 mg via INTRAVENOUS
  Filled 2020-12-30 (×2): qty 1250

## 2020-12-30 MED ORDER — SODIUM CHLORIDE 0.9 % IV SOLN: INTRAVENOUS | Status: DC

## 2020-12-30 MED ORDER — LORAZEPAM 1 MG PO TABS: 0.0000 mg | ORAL_TABLET | Freq: Four times a day (QID) | ORAL | Status: DC

## 2020-12-30 MED ORDER — THIAMINE HCL 100 MG PO TABS
100.0000 mg | ORAL_TABLET | Freq: Every day | ORAL | Status: DC
  Administered 2020-12-31: 100 mg via ORAL
  Filled 2020-12-30 (×2): qty 1

## 2020-12-30 MED ORDER — SODIUM CHLORIDE 0.9 % IV BOLUS
1000.0000 mL | Freq: Once | INTRAVENOUS | Status: AC
  Administered 2020-12-30: 1000 mL via INTRAVENOUS

## 2020-12-30 MED ORDER — NICOTINE 14 MG/24HR TD PT24
14.0000 mg | MEDICATED_PATCH | Freq: Every day | TRANSDERMAL | Status: DC
  Administered 2020-12-31: 14 mg via TRANSDERMAL
  Filled 2020-12-30: qty 1

## 2020-12-30 MED ORDER — ACETAMINOPHEN 650 MG RE SUPP: 650.0000 mg | Freq: Four times a day (QID) | RECTAL | Status: DC | PRN

## 2020-12-30 MED ORDER — ONDANSETRON HCL 4 MG/2ML IJ SOLN
4.0000 mg | Freq: Once | INTRAMUSCULAR | Status: AC
  Administered 2020-12-30: 4 mg via INTRAVENOUS
  Filled 2020-12-30: qty 2

## 2020-12-30 MED ORDER — PANTOPRAZOLE SODIUM 40 MG IV SOLR
40.0000 mg | Freq: Once | INTRAVENOUS | Status: AC
  Administered 2020-12-30: 40 mg via INTRAVENOUS
  Filled 2020-12-30: qty 40

## 2020-12-30 MED ORDER — LORAZEPAM 2 MG/ML IJ SOLN
1.0000 mg | INTRAMUSCULAR | Status: DC | PRN
  Administered 2020-12-31: 3 mg via INTRAVENOUS
  Administered 2020-12-31 (×2): 2 mg via INTRAVENOUS
  Filled 2020-12-30: qty 1
  Filled 2020-12-30: qty 2
  Filled 2020-12-30: qty 1

## 2020-12-30 MED ORDER — PANTOPRAZOLE SODIUM 40 MG IV SOLR
40.0000 mg | INTRAVENOUS | Status: DC
  Administered 2020-12-31: 40 mg via INTRAVENOUS
  Filled 2020-12-30: qty 40

## 2020-12-30 MED ORDER — METOCLOPRAMIDE HCL 5 MG/ML IJ SOLN
10.0000 mg | Freq: Once | INTRAMUSCULAR | Status: AC
  Administered 2020-12-30: 10 mg via INTRAVENOUS
  Filled 2020-12-30: qty 2

## 2020-12-30 MED ORDER — LORAZEPAM 1 MG PO TABS: 0.0000 mg | ORAL_TABLET | Freq: Two times a day (BID) | ORAL | Status: DC

## 2020-12-30 MED ORDER — THIAMINE HCL 100 MG/ML IJ SOLN
100.0000 mg | Freq: Every day | INTRAMUSCULAR | Status: DC
  Administered 2020-12-30: 100 mg via INTRAVENOUS
  Filled 2020-12-30: qty 2

## 2020-12-30 MED ORDER — FOLIC ACID 1 MG PO TABS
1.0000 mg | ORAL_TABLET | Freq: Every day | ORAL | Status: DC
  Administered 2020-12-30 – 2020-12-31 (×2): 1 mg via ORAL
  Filled 2020-12-30 (×2): qty 1

## 2020-12-30 MED ORDER — VANCOMYCIN HCL 1.25 G IV SOLR
1250.0000 mg | Freq: Once | INTRAVENOUS | Status: AC
  Administered 2020-12-30: 1250 mg via INTRAVENOUS
  Filled 2020-12-30: qty 1000

## 2020-12-30 MED ORDER — LORAZEPAM 2 MG/ML IJ SOLN
0.0000 mg | Freq: Four times a day (QID) | INTRAMUSCULAR | Status: DC
  Administered 2020-12-30: 2 mg via INTRAVENOUS
  Filled 2020-12-30: qty 1

## 2020-12-30 MED ORDER — ENOXAPARIN SODIUM 40 MG/0.4ML ~~LOC~~ SOLN
40.0000 mg | SUBCUTANEOUS | Status: DC
  Administered 2020-12-30: 40 mg via SUBCUTANEOUS
  Filled 2020-12-30: qty 0.4

## 2020-12-30 MED ORDER — ONDANSETRON HCL 4 MG/2ML IJ SOLN
4.0000 mg | Freq: Four times a day (QID) | INTRAMUSCULAR | Status: DC | PRN
  Administered 2020-12-30 – 2020-12-31 (×2): 4 mg via INTRAVENOUS
  Filled 2020-12-30 (×2): qty 2

## 2020-12-30 NOTE — ED Notes (Signed)
While NT was updating pts VS, pt stated she felt like she was going to pass out and felt dizzy. Pt shaking and sweaty in lobby. NT notified charge RN.

## 2020-12-30 NOTE — H&P (Signed)
History and Physical    Miranda Wood QZR:[id number] DOB: 1976/04/01 DOA: 12/30/2020  PCP: Marliss Coots, NP Patient coming from: Home  Chief Complaint: Seizure  HPI: Miranda Wood is a 45 y.o. female with medical history significant of alcohol dependence, history of alcohol withdrawal seizures, depression, anxiety, tobacco use presenting to the ED via EMS after she had a seizure witnessed.  History provided by patient and her female friend/roommate at bedside.  Roommate states that patient drinks heavily on a regular basis, a pint of vodka every few hours but has not had any alcoholic beverages since last night as she slept for a long time.  She has been vomiting since she woke up in the morning and he feels that she is withdrawing.  He also witnessed her having a seizure this morning where her body was shaking.  Patient states she has been admitted to the hospital in the past for alcohol withdrawal but does not recall having seizures.  Denies abdominal pain or diarrhea.  Denies fevers, chills, cough, shortness of breath, or chest pain.  She is not vaccinated against COVID.  Roommate is concerned that the patient has a boil on her back that has been draining pus.  Patient thinks it has been present for over a year.  ED Course: Slightly tachycardic, remainder of vital signs stable.  Labs showing WBC 5.6, hemoglobin 13.7, platelet count 107K.  Sodium 135, potassium 3.6, chloride 95, bicarb 23, BUN 7, creatinine 0.6, glucose 172.  AST 94, T bili 1.4.  ALT and alk phos within normal range.  Lipase normal.  UA pending.  Blood ethanol level undetectable.  Beta hCG negative.  SARS-CoV-2 PCR test pending.  Magnesium level pending.  Head CT negative for acute finding. Patient was given Ativan, Reglan, Zofran, thiamine, 2 L normal saline boluses, and IV Protonix 40 mg.  Review of Systems:  All systems reviewed and apart from history of presenting illness, are negative.  Past Medical History:   Diagnosis Date  . Anxiety   . Blood transfusion without reported diagnosis   . Complication of anesthesia    HR and B/P were elevated with epidural    Past Surgical History:  Procedure Laterality Date  . CESAREAN SECTION    . LEG SURGERY    . LIPOMA EXCISION  2011  . TUBAL LIGATION Bilateral 2012  . vaginal del x3       reports that she has been smoking cigarettes. She has been smoking about 0.50 packs per day. She has never used smokeless tobacco. She reports current alcohol use. She reports that she does not use drugs.  Allergies  Allergen Reactions  . Penicillins Shortness Of Breath    Has patient had a PCN reaction causing immediate rash, facial/tongue/throat swelling, SOB or lightheadedness with hypotension: Yes Has patient had a PCN reaction causing severe rash involving mucus membranes or skin necrosis: No Has patient had a PCN reaction that required hospitalization: No Has patient had a PCN reaction occurring within the last 10 years: No If all of the above answers are "NO", then may proceed with Cephalosporin use.   . Benadryl [Diphenhydramine] Hives    Hives per patient    History reviewed. No pertinent family history.  Prior to Admission medications   Medication Sig Start Date End Date Taking? Authorizing Provider  hydrOXYzine (ATARAX/VISTARIL) 25 MG tablet Take 1 tablet (25 mg total) by mouth every 8 (eight) hours as needed for anxiety. Patient not taking: Reported on 12/30/2020 03/19/20  Margarita Mail, [place]    Physical Exam: Vitals:   12/30/20 1533 12/30/20 1733 12/30/20 1824 12/30/20 1847  BP: 124/86 (!) 144/98 (!) 127/92 (!) 126/92  Pulse: 87 62 100 82  Resp: 18 16  (!) 24  Temp:  98.2 F (36.8 C)    TempSrc:  Oral    SpO2: 98% 95%  98%    Physical Exam Constitutional:      Comments: Tremulous  HENT:     Head: Normocephalic and atraumatic.     Mouth/Throat:     Mouth: Mucous membranes are dry.  Eyes:     Extraocular Movements: Extraocular  movements intact.     Conjunctiva/sclera: Conjunctivae normal.  Cardiovascular:     Rate and Rhythm: Normal rate and regular rhythm.     Pulses: Normal pulses.  Pulmonary:     Effort: Pulmonary effort is normal. No respiratory distress.     Breath sounds: Normal breath sounds. No wheezing or rales.  Abdominal:     General: Bowel sounds are normal. There is no distension.     Palpations: Abdomen is soft.     Tenderness: There is no abdominal tenderness.  Musculoskeletal:        General: No swelling or tenderness.     Cervical back: Normal range of motion and neck supple.  Skin:    General: Skin is warm and dry.     Comments: A few furuncles and a carbuncle noted on her back.  No signs of cellulitis.  Neurological:     General: No focal deficit present.     Mental Status: She is alert and oriented to person, place, and time.     Labs on Admission: I have personally reviewed following labs and imaging studies  CBC: Recent Labs  Lab 12/30/20 1318  WBC 5.6  HGB 13.7  HCT 41.9  MCV 109.1*  PLT 462*   Basic Metabolic Panel: Recent Labs  Lab 12/30/20 1318  NA 135  K 3.6  CL 95*  CO2 23  GLUCOSE 172*  BUN 7  CREATININE 0.66  CALCIUM 9.8   GFR: CrCl cannot be calculated (Unknown ideal weight.). Liver Function Tests: Recent Labs  Lab 12/30/20 1318  AST 94*  ALT 26  ALKPHOS 109  BILITOT 1.4*  PROT 8.3*  ALBUMIN 3.8   Recent Labs  Lab 12/30/20 1318  LIPASE 48   No results for input(s): AMMONIA in the last 168 hours. Coagulation Profile: No results for input(s): INR, PROTIME in the last 168 hours. Cardiac Enzymes: No results for input(s): CKTOTAL, CKMB, CKMBINDEX, TROPONINI in the last 168 hours. BNP (last 3 results) No results for input(s): PROBNP in the last 8760 hours. HbA1C: No results for input(s): HGBA1C in the last 72 hours. CBG: No results for input(s): GLUCAP in the last 168 hours. Lipid Profile: No results for input(s): CHOL, HDL, LDLCALC,  TRIG, CHOLHDL, LDLDIRECT in the last 72 hours. Thyroid Function Tests: No results for input(s): TSH, T4TOTAL, FREET4, T3FREE, THYROIDAB in the last 72 hours. Anemia Panel: No results for input(s): VITAMINB12, FOLATE, FERRITIN, TIBC, IRON, RETICCTPCT in the last 72 hours. Urine analysis:    Component Value Date/Time   COLORURINE AMBER (A) 03/19/2020 1832   APPEARANCEUR CLOUDY (A) 03/19/2020 1832   LABSPEC 1.019 03/19/2020 1832   PHURINE 6.0 03/19/2020 1832   GLUCOSEU NEGATIVE 03/19/2020 1832   HGBUR LARGE (A) 03/19/2020 1832   BILIRUBINUR NEGATIVE 03/19/2020 [place] 03/19/2020 1832   PROTEINUR 100 (A) 03/19/2020 1832  UROBILINOGEN 1.0 10/21/2012 1118   NITRITE POSITIVE (A) 03/19/2020 1832   LEUKOCYTESUR MODERATE (A) 03/19/2020 1832    Radiological Exams on Admission: CT Head Wo Contrast  Result Date: 12/30/2020 CLINICAL DATA:  Seizure, nontraumatic. EXAM: CT HEAD WITHOUT CONTRAST TECHNIQUE: Contiguous axial images were obtained from the base of the skull through the vertex without intravenous contrast. COMPARISON:  CT head 10/21/2018 FINDINGS: Brain: Patchy and confluent areas of decreased attenuation are noted throughout the deep and periventricular white matter of the cerebral hemispheres bilaterally, compatible with chronic microvascular ischemic disease. No evidence of large-territorial acute infarction. No parenchymal hemorrhage. No mass lesion. No extra-axial collection. No mass effect or midline shift. No hydrocephalus. Basilar cisterns are patent. Vascular: No hyperdense vessel. Skull: No acute fracture or focal lesion. Sinuses/Orbits: Paranasal sinuses and mastoid air cells are clear. The orbits are unremarkable. Other: None. IMPRESSION: No acute intracranial abnormality. Electronically Signed   By: Iven Finn M.D.   On: 12/30/2020 20:20    EKG: Independently reviewed.  Sinus rhythm, artifact.  No significant change since prior  tracing.  Assessment/Plan Principal Problem:   Alcohol withdrawal seizure ([place]) Active Problems:   Alcohol dependence (HCC)   Nausea and vomiting   Tobacco use   Thrombocytopenia (HCC)   Alcohol withdrawal seizure Alcohol dependence Suspect seizure is due to alcohol withdrawal as patient has a known history of alcohol dependence and has not consumed any alcoholic beverages since last night. Blood ethanol level undetectable on labs.  CIWA score 14.  Head CT negative for acute finding. -CIWA protocol; Ativan as needed.  Thiamine, folate, multivitamin.  Seizure precautions.  Magnesium level pending.  Nausea, emesis: Possibly due to alcoholic gastritis.  AST and T bili slightly elevated in setting of chronic alcohol use.  ALT and alk phos normal.  Lipase normal.  Beta hCG negative.  Abdominal exam benign. -Continue IV PPI, antiemetic as needed, IV fluid hydration.  UA pending.  Viral gastroenteritis is also a possibility as she is not vaccinated against COVID, SARS-CoV-2 PCR test pending, continue isolation precautions.  Tobacco use: Smokes 3/4 pack of cigarettes daily. -NicoDerm patch and counseling  Mild thrombocytopenia: Likely related to chronic alcohol use.  No signs of active bleeding. -Continue to monitor  Carbuncle: Patient noted to have a few furuncles and a carbuncle on her back which reportedly was draining pus at home.  No signs of cellulitis.  Does not appear to have a large drainable abscess at this time. -Start vancomycin and monitor closely.  May need I&D if not improving.  DVT prophylaxis: Lovenox Code Status: Full code Family Communication: Patient's female friend/roommate at bedside. Disposition Plan: Status is: Observation  The patient remains OBS appropriate and will d/c before 2 midnights.  Dispo: The patient is from: Home              Anticipated d/c is to: Home              Patient currently is not medically stable to d/c.   Difficult to place patient  No  Level of care: Level of care: Progressive   The medical decision making on this patient was of high complexity and the patient is at high risk for clinical deterioration, therefore this is a level 3 visit.  Shela Leff MD [facility]  If 7PM-7AM, please contact night-coverage [email or web address]  12/30/2020, 9:36 PM

## 2020-12-30 NOTE — Progress Notes (Signed)
Pharmacy Antibiotic Note  Miranda Wood is a 46 y.o. female admitted on 12/30/2020 with carbuncle with possible abscess.  Pharmacy has been consulted for vancomycin dosing.  WBC and SCr wnl.   Plan: -Vancomycin 1250 mg IV load followed by Vancomycin 1000 mg IV Q 12 hrs. Goal AUC 400-550. Expected AUC: 494 SCr used: 0.7 -Monitor CBC, renal fx, cultures and clinical progress -Vanc levels as indicated       Temp (24hrs), Avg:98.2 F (36.8 C), Min:97.9 F (36.6 C), Max:98.6 F (37 C)  Recent Labs  Lab 12/30/20 1318  WBC 5.6  CREATININE 0.66    CrCl cannot be calculated (Unknown ideal weight.).    Allergies  Allergen Reactions  . Penicillins Shortness Of Breath    Has patient had a PCN reaction causing immediate rash, facial/tongue/throat swelling, SOB or lightheadedness with hypotension: Yes Has patient had a PCN reaction causing severe rash involving mucus membranes or skin necrosis: No Has patient had a PCN reaction that required hospitalization: No Has patient had a PCN reaction occurring within the last 10 years: No If all of the above answers are "NO", then may proceed with Cephalosporin use.   . Benadryl [Diphenhydramine] Hives    Hives per patient    Antimicrobials this admission: Vancomycin 3/4 >>   Dose adjustments this admission:   Microbiology results:   Thank you for allowing pharmacy to be a part of this patient's care.  Vinnie Level, PharmD., BCPS, BCCCP Clinical Pharmacist Please refer to [facility] for unit-specific pharmacist

## 2020-12-30 NOTE — ED Provider Notes (Addendum)
[facility] EMERGENCY DEPARTMENT Provider Note   CSN: [id number] Arrival date & time: 12/30/20  1256     History No chief complaint on file.   Miranda Wood is a 45 y.o. female.  Patient with past history of alcohol abuse.  But for the past several months patient has been drinking heavily again.  There was a witnessed seizure.  By bystanders patient has not had a drink of alcohol since about 2 in the morning.  Patient does not take medications for seizures.  Patient's main complaint to me was tremors feeling as if she was going to pass out.  Dry heaves.  Nausea vomiting.  No blood in the vomit no diarrhea.  No abdominal pain.  Patient upon arrival to room was slightly tachycardic.  Patient denies any headache.  Any history of fevers or upper respiratory symptoms.        Past Medical History:  Diagnosis Date  . Anxiety   . Blood transfusion without reported diagnosis   . Complication of anesthesia    HR and B/P were elevated with epidural    Patient Active Problem List   Diagnosis Date Noted  . Alcohol dependence with withdrawal with complication (HCC) 04/29/2018  . 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 Substance induced mood disorder (HCC)   . Substance-induced anxiety disorder (HCC)   . Alcohol withdrawal without perceptual disturbances with complication (HCC)   . Cesarean delivery delivered 07/10/2011    Past Surgical History:  Procedure Laterality Date  . CESAREAN SECTION    . LEG SURGERY    . LIPOMA EXCISION  2011  . TUBAL LIGATION Bilateral 2012  . vaginal del x3       OB History    Gravida  6   Para  4   Term  4   Preterm      AB  2   Living  5     SAB  2   IAB      Ectopic      Multiple  1   Live Births  2           No family history on file.  Social History   Tobacco Use  . Smoking status: Current Every Day Smoker    Packs/day: 0.50    Types:  Cigarettes    Last attempt to quit: 10/29/2012    Years since quitting: 8.1  . Smokeless tobacco: Never Used  Substance Use Topics  . Alcohol use: Yes  . Drug use: No    Home Medications Prior to Admission medications   Medication Sig Start Date End Date Taking? Authorizing Provider  hydrOXYzine (ATARAX/VISTARIL) 25 MG tablet Take 1 tablet (25 mg total) by mouth every 8 (eight) hours as needed for anxiety. Patient not taking: Reported on 12/30/2020 03/19/20   Arthor Captain, PA-C    Allergies    Penicillins and Benadryl [diphenhydramine]  Review of Systems   Review of Systems  Constitutional: Negative for chills and fever.  HENT: Negative for rhinorrhea and sore throat.   Eyes: Negative for visual disturbance.  Respiratory: Negative for cough and shortness of breath.   Cardiovascular: Negative for chest pain and leg swelling.  Gastrointestinal: Positive for nausea and vomiting. Negative for abdominal pain and diarrhea.  Genitourinary: Negative for dysuria.  Musculoskeletal: Negative for back pain and neck pain.  Skin: Negative for rash.  Neurological: Positive for tremors and seizures. Negative for dizziness, light-headedness and headaches.  Hematological: Does not bruise/bleed easily.  Psychiatric/Behavioral: Negative for confusion.    Physical Exam Updated Vital Signs BP (!) 126/92 (BP Location: Right Arm)   Pulse 82   Temp 98.2 F (36.8 C) (Oral)   Resp (!) 24   SpO2 98%   Physical Exam Vitals and nursing note reviewed.  Constitutional:      General: She is not in acute distress.    Appearance: Normal appearance. She is well-developed and well-nourished. She is ill-appearing.  HENT:     Head: Normocephalic and atraumatic.     Mouth/Throat:     Mouth: Mucous membranes are dry.  Eyes:     Extraocular Movements: Extraocular movements intact.     Conjunctiva/sclera: Conjunctivae normal.     Pupils: Pupils are equal, round, and reactive to light.  Cardiovascular:      Rate and Rhythm: Regular rhythm. Tachycardia present.     Heart sounds: No murmur heard.   Pulmonary:     Effort: Pulmonary effort is normal. No respiratory distress.     Breath sounds: Normal breath sounds.  Abdominal:     Palpations: Abdomen is soft.     Tenderness: There is no abdominal tenderness.     Comments: Abdomen soft nontender.  Musculoskeletal:        General: No edema. Normal range of motion.     Cervical back: Normal range of motion and neck supple.  Skin:    General: Skin is warm and dry.     Capillary Refill: Capillary refill takes less than 2 seconds.     Findings: Erythema present. No rash.  Neurological:     General: No focal deficit present.     Mental Status: She is alert and oriented to person, place, and time.     Cranial Nerves: No cranial nerve deficit.     Sensory: No sensory deficit.     Motor: No weakness.     Comments: Persistent tremor  Psychiatric:        Mood and Affect: Mood and affect normal.     ED Results / Procedures / Treatments   Labs (all labs ordered are listed, but only abnormal results are displayed) Labs Reviewed  COMPREHENSIVE METABOLIC PANEL - Abnormal; Notable for the following components:      Result Value   Chloride 95 (*)    Glucose, Bld 172 (*)    Total Protein 8.3 (*)    AST 94 (*)    Total Bilirubin 1.4 (*)    Anion gap 17 (*)    All other components within normal limits  CBC - Abnormal; Notable for the following components:   RBC 3.84 (*)    MCV 109.1 (*)    MCH 35.7 (*)    Platelets 107 (*)    All other components within normal limits  SARS CORONAVIRUS 2 (TAT 6-24 HRS)  LIPASE, BLOOD  ETHANOL  URINALYSIS, ROUTINE W REFLEX MICROSCOPIC  I-STAT BETA HCG BLOOD, ED ([facility], [facility], [facility] ONLY)  CBG MONITORING, ED    EKG EKG Interpretation  Date/Time:  Friday December 30 2020 13:11:21 EST Ventricular Rate:  72 PR Interval:  126 QRS Duration: 62 QT Interval:  410 QTC Calculation: 448 R Axis:   37 Text  Interpretation: Sinus rhythm with Premature supraventricular complexes Otherwise normal ECG Interpretation limited secondary to artifact Confirmed by Vanetta Mulders [phone]) on 12/30/2020 4:40:48 PM   Radiology CT Head Wo Contrast  Result Date: 12/30/2020 CLINICAL DATA:  Seizure, nontraumatic. EXAM: CT HEAD WITHOUT CONTRAST  TECHNIQUE: Contiguous axial images were obtained from the base of the skull through the vertex without intravenous contrast. COMPARISON:  CT head 10/21/2018 FINDINGS: Brain: Patchy and confluent areas of decreased attenuation are noted throughout the deep and periventricular white matter of the cerebral hemispheres bilaterally, compatible with chronic microvascular ischemic disease. No evidence of large-territorial acute infarction. No parenchymal hemorrhage. No mass lesion. No extra-axial collection. No mass effect or midline shift. No hydrocephalus. Basilar cisterns are patent. Vascular: No hyperdense vessel. Skull: No acute fracture or focal lesion. Sinuses/Orbits: Paranasal sinuses and mastoid air cells are clear. The orbits are unremarkable. Other: None. IMPRESSION: No acute intracranial abnormality. Electronically Signed   By: Tish FredericksonMorgane  Naveau M.D.   On: 12/30/2020 20:20    Procedures Procedures   CRITICAL CARE Performed by: Vanetta MuldersScott Judye Lorino Total critical care time: 45 minutes Critical care time was exclusive of separately billable procedures and treating other patients. Critical care was necessary to treat or prevent imminent or life-threatening deterioration. Critical care was time spent personally by me on the following activities: development of treatment plan with patient and/or surrogate as well as nursing, discussions with consultants, evaluation of patient's response to treatment, examination of patient, obtaining history from patient or surrogate, ordering and performing treatments and interventions, ordering and review of laboratory studies, ordering and review of  radiographic studies, pulse oximetry and re-evaluation of patient's condition.   Medications Ordered in ED Medications  0.9 %  sodium chloride infusion ( Intravenous New Bag/Given 12/30/20 1915)  LORazepam (ATIVAN) injection 0-4 mg (2 mg Intravenous Given 12/30/20 1811)    Or  LORazepam (ATIVAN) tablet 0-4 mg ( Oral See Alternative 12/30/20 1811)  LORazepam (ATIVAN) injection 0-4 mg (has no administration in time range)    Or  LORazepam (ATIVAN) tablet 0-4 mg (has no administration in time range)  thiamine tablet 100 mg ( Oral See Alternative 12/30/20 1831)    Or  thiamine (B-1) injection 100 mg (100 mg Intravenous Given 12/30/20 1831)  sodium chloride 0.9 % bolus 1,000 mL (has no administration in time range)  sodium chloride 0.9 % bolus 1,000 mL (0 mLs Intravenous Stopped 12/30/20 1914)  ondansetron (ZOFRAN) injection 4 mg (4 mg Intravenous Given 12/30/20 1808)  pantoprazole (PROTONIX) injection 40 mg (40 mg Intravenous Given 12/30/20 1817)  metoCLOPramide (REGLAN) injection 10 mg (10 mg Intravenous Given 12/30/20 1815)    ED Course  I have reviewed the triage vital signs and the nursing notes.  Pertinent labs & imaging results that were available during my care of the patient were reviewed by me and considered in my medical decision making (see chart for details).    MDM Rules/Calculators/A&P                         Feel the patient seizure most likely secondary to alcohol.  Patient appears to be going through alcohol withdrawal at this time.  With a component of gastritis mostly likely secondary to alcohol as well.  No vomiting of any blood.  Patient's labs without significant abnormalities.  Head CT negative.  Covid testing pending.  Patient treated with CIWA protocol.  Her CIWA was in the teens I think the first 1 was listed at 3313 but then she went up to 15.  Patient did receive 2 mg of Ativan per the protocol at 1800.  Patient also received 2 L of fluid.  Received Reglan received Zofran and  received Protonix.  Patient feeling better but is still a  little tachycardic heart rate in the upper 90s sometimes a little bit over 100.  And patient still has tremors.  But overall some improvement.  Will contact internal medicine service for admission for alcohol withdrawal and gastritis.  As stated I think the seizure was related to alcohol.  Will check magnesium though.     Final Clinical Impression(s) / ED Diagnoses Final diagnoses:  Alcohol withdrawal syndrome without complication (HCC)  Acute alcoholic gastritis without hemorrhage    Rx / DC Orders ED Discharge Orders    None       Vanetta Mulders, MD 12/30/20 2056    Vanetta Mulders, MD 12/30/20 [phone]

## 2020-12-30 NOTE — ED Triage Notes (Signed)
Pt bib ems with reports of witnessed seizure. Per bystanders pt did not have a drink of etoh since last night. Pt does has a hx of seizures but does not take meds.

## 2020-12-31 ENCOUNTER — Other Ambulatory Visit: Payer: Self-pay

## 2020-12-31 DIAGNOSIS — L723 Sebaceous cyst: Secondary | ICD-10-CM | POA: Diagnosis present

## 2020-12-31 DIAGNOSIS — D696 Thrombocytopenia, unspecified: Secondary | ICD-10-CM

## 2020-12-31 DIAGNOSIS — F10231 Alcohol dependence with withdrawal delirium: Principal | ICD-10-CM

## 2020-12-31 DIAGNOSIS — N390 Urinary tract infection, site not specified: Secondary | ICD-10-CM | POA: Diagnosis present

## 2020-12-31 DIAGNOSIS — R8281 Pyuria: Secondary | ICD-10-CM | POA: Diagnosis present

## 2020-12-31 DIAGNOSIS — Z720 Tobacco use: Secondary | ICD-10-CM

## 2020-12-31 DIAGNOSIS — L089 Local infection of the skin and subcutaneous tissue, unspecified: Secondary | ICD-10-CM | POA: Diagnosis present

## 2020-12-31 DIAGNOSIS — F10931 Alcohol use, unspecified with withdrawal delirium: Secondary | ICD-10-CM | POA: Diagnosis present

## 2020-12-31 DIAGNOSIS — R112 Nausea with vomiting, unspecified: Secondary | ICD-10-CM

## 2020-12-31 LAB — CBC
HCT: 35.4 % — ABNORMAL LOW (ref 36.0–46.0)
Hemoglobin: 11.9 g/dL — ABNORMAL LOW (ref 12.0–15.0)
MCH: 36.8 pg — ABNORMAL HIGH (ref 26.0–34.0)
MCHC: 33.6 g/dL (ref 30.0–36.0)
MCV: 109.6 fL — ABNORMAL HIGH (ref 80.0–100.0)
Platelets: 85 10*3/uL — ABNORMAL LOW (ref 150–400)
RBC: 3.23 MIL/uL — ABNORMAL LOW (ref 3.87–5.11)
RDW: 13.4 % (ref 11.5–15.5)
WBC: 4.6 10*3/uL (ref 4.0–10.5)
nRBC: 0 % (ref 0.0–0.2)

## 2020-12-31 LAB — SARS CORONAVIRUS 2 (TAT 6-24 HRS): SARS Coronavirus 2: NEGATIVE

## 2020-12-31 LAB — URINALYSIS, ROUTINE W REFLEX MICROSCOPIC
Bilirubin Urine: NEGATIVE
Glucose, UA: NEGATIVE mg/dL
Ketones, ur: NEGATIVE mg/dL
Nitrite: NEGATIVE
Protein, ur: 30 mg/dL — AB
Specific Gravity, Urine: 1.017 (ref 1.005–1.030)
pH: 8 (ref 5.0–8.0)

## 2020-12-31 LAB — HIV ANTIBODY (ROUTINE TESTING W REFLEX): HIV Screen 4th Generation wRfx: NONREACTIVE

## 2020-12-31 MED ORDER — LEVOFLOXACIN IN D5W 750 MG/150ML IV SOLN
750.0000 mg | INTRAVENOUS | Status: DC
  Filled 2020-12-31 (×2): qty 150

## 2020-12-31 MED ORDER — MAGNESIUM SULFATE 4 GM/100ML IV SOLN: 4.0000 g | Freq: Once | INTRAVENOUS | Status: DC

## 2020-12-31 MED ORDER — SODIUM CHLORIDE 0.9 % IV SOLN: INTRAVENOUS | Status: DC

## 2020-12-31 MED ORDER — MAGNESIUM SULFATE 2 GM/50ML IV SOLN
2.0000 g | Freq: Once | INTRAVENOUS | Status: AC
  Administered 2020-12-31: 2 g via INTRAVENOUS
  Filled 2020-12-31: qty 50

## 2020-12-31 MED ORDER — CHLORDIAZEPOXIDE HCL 25 MG PO CAPS
25.0000 mg | ORAL_CAPSULE | Freq: Three times a day (TID) | ORAL | Status: DC
  Administered 2020-12-31 (×2): 25 mg via ORAL
  Filled 2020-12-31 (×2): qty 1

## 2020-12-31 MED ORDER — MAGNESIUM SULFATE 2 GM/50ML IV SOLN
2.0000 g | Freq: Once | INTRAVENOUS | Status: AC
  Administered 2020-12-31: 2 g via INTRAVENOUS
  Filled 2020-12-31 (×2): qty 50

## 2020-12-31 NOTE — Progress Notes (Addendum)
PROGRESS NOTE   Miranda Wood  IAX:[id number] DOB: Oct 25, 1976 DOA: 12/30/2020 PCP: Lavinia Sharps, NP   No chief complaint on file.  Level of care: Progressive  Brief Admission History:  45 y.o. female with medical history significant of alcohol dependence, history of alcohol withdrawal seizures, depression, anxiety, tobacco use presenting to the ED via EMS after she had a seizure witnessed.  History provided by patient and her female friend/roommate at bedside.  Roommate states that patient drinks heavily on a regular basis, a pint of vodka every few hours but has not had any alcoholic beverages since last night as she slept for a long time.  She has been vomiting since she woke up in the morning and he feels that she is withdrawing.  He also witnessed her having a seizure this morning where her body was shaking.  Patient states she has been admitted to the hospital in the past for alcohol withdrawal but does not recall having seizures.  Denies abdominal pain or diarrhea.  Denies fevers, chills, cough, shortness of breath, or chest pain.  She is not vaccinated against COVID.  Roommate is concerned that the patient has a boil on her back that has been draining pus.  Patient thinks it has been present for over a year.  Pt was admitted with acute alcohol withdrawal with seizure and hyperactivity.    Assessment & Plan:   Principal Problem:   Alcohol withdrawal seizure (HCC) Active Problems:   Alcohol dependence (HCC)   Nausea and vomiting   Tobacco use   Thrombocytopenia (HCC)   Seizure (HCC)   Acute hyperactive alcohol withdrawal delirium (HCC)   Hypomagnesemia   Hyperbilirubinemia   Infected sebaceous cyst of back  1. Acute alcohol withdrawal with alcohol withdrawal seizure - Pt endorses heavy daily alcohol consumption and reported history of delirium tremens. Trial of supportive measures, start librium 25 mg TID, CIWA protocol, IV fluids, monitor in progressive unit.  2. Delirium  tremens - admit to progressive care unit due to high risk for requiring precedex infusion.  3. Nausea and vomiting - consequence of acute alcohol withdrawal.  4. Tobacco abuse - nicotine patch ordered.  Pt not in any condition for counseling at this time.  5. Chronic thrombocytopenia - from alcoholic liver disease and chronic alcohol abuse, following.  6. Infected sebaceous cyst on back - Treat with antibiotics, can refer to surgery for removal at later time when medically stable.   7. Hypomagnesemia - IV replacement given.   8. Hyperbilirubinemia - secondary to chronic liver disease, recheck in AM after IV fluid.  9. UTI - follow urine culture - Rx levofloxacin.   DVT prophylaxis: enoxaparin  Code Status:  Full  Family Communication: none present  Disposition:  TBD but anticipate home  Status is: Inpatient  Remains inpatient appropriate because:IV treatments appropriate due to intensity of illness or inability to take PO and Inpatient level of care appropriate due to severity of illness  Dispo: The patient is from: Home              Anticipated d/c is to: Home              Patient currently is not medically stable to d/c.   Difficult to place patient No  Consultants:   n/a  Procedures:   n/a  Antimicrobials:  vancomycin  Subjective: Pt confused and irritable. Very tremulous.  Oriented to person and place.    Objective: Vitals:   12/31/20 1315 12/31/20 1330 12/31/20  1345 12/31/20 1400  BP: (!) 120/96 123/75 123/89 111/82  Pulse: 94 74 96 78  Resp: 18 (!) 22 (!) 21 (!) 24  Temp:      TempSrc:      SpO2: 94% 100% 100% 100%  Weight:      Height:        Intake/Output Summary (Last 24 hours) at 12/31/2020 1419 Last data filed at 12/31/2020 1334 Gross per 24 hour  Intake 3402.74 ml  Output --  Net 3402.74 ml   Filed Weights   12/31/20 0011  Weight: 61.2 kg   Examination:  General exam: chronically ill appearing, appears older than stated age, tremulous and anxious.    Respiratory system: Clear to auscultation. Respiratory effort normal. Cardiovascular system: normal S1 & S2 heard. tachycardic rate.  No JVD, murmurs, rubs, gallops or clicks. No pedal edema. Gastrointestinal system: Abdomen is nondistended, soft and nontender. No organomegaly or masses felt. Normal bowel sounds heard. Central nervous system: Alert and oriented. No focal neurological deficits. Extremities: Symmetric 5 x 5 power. Skin: sebaceous cyst on back appears infected but had spontaneously drained and scabbed over.  Psychiatry: Judgement and insight appear poor. Mood & affect irritable.   Data Reviewed: I have personally reviewed following labs and imaging studies  CBC: Recent Labs  Lab 12/30/20 1318 12/31/20 0320  WBC 5.6 4.6  HGB 13.7 11.9*  HCT 41.9 35.4*  MCV 109.1* 109.6*  PLT 107* 85*    Basic Metabolic Panel: Recent Labs  Lab 12/30/20 1318 12/30/20 2058  NA 135  --   K 3.6  --   CL 95*  --   CO2 23  --   GLUCOSE 172*  --   BUN 7  --   CREATININE 0.66  --   CALCIUM 9.8  --   MG  --  1.6*    GFR: Estimated Creatinine Clearance: 88.5 mL/min (by C-G formula based on SCr of 0.66 mg/dL).  Liver Function Tests: Recent Labs  Lab 12/30/20 1318  AST 94*  ALT 26  ALKPHOS 109  BILITOT 1.4*  PROT 8.3*  ALBUMIN 3.8    CBG: No results for input(s): GLUCAP in the last 168 hours.  Recent Results (from the past 240 hour(s))  SARS CORONAVIRUS 2 (TAT 6-24 HRS) Nasopharyngeal Nasopharyngeal Swab     Status: None   Collection Time: 12/30/20  8:51 PM   Specimen: Nasopharyngeal Swab  Result Value Ref Range Status   SARS Coronavirus 2 NEGATIVE NEGATIVE Final    Comment: (NOTE) SARS-CoV-2 target nucleic acids are NOT DETECTED.  The SARS-CoV-2 RNA is generally detectable in upper and lower respiratory specimens during the acute phase of infection. Negative results do not preclude SARS-CoV-2 infection, do not rule out co-infections with other pathogens, and  should not be used as the sole basis for treatment or other patient management decisions. Negative results must be combined with clinical observations, patient history, and epidemiological information. The expected result is Negative.  Fact Sheet for Patients: HairSlick.no  Fact Sheet for Healthcare Providers: quierodirigir.com  This test is not yet approved or cleared by the [place] FDA and  has been authorized for detection and/or diagnosis of SARS-CoV-2 by FDA under an Emergency Use Authorization (EUA). This EUA will remain  in effect (meaning this test can be used) for the duration of the COVID-19 declaration under Se ction 564(b)(1) of the Act, 21 U.S.C. section 360bbb-3(b)(1), unless the authorization is terminated or revoked sooner.  Performed at [facility],  [address], [place], [place] [address]      Radiology Studies: CT Head Wo Contrast  Result Date: 12/30/2020 CLINICAL DATA:  Seizure, nontraumatic. EXAM: CT HEAD WITHOUT CONTRAST TECHNIQUE: Contiguous axial images were obtained from the base of the skull through the vertex without intravenous contrast. COMPARISON:  CT head 10/21/2018 FINDINGS: Brain: Patchy and confluent areas of decreased attenuation are noted throughout the deep and periventricular white matter of the cerebral hemispheres bilaterally, compatible with chronic microvascular ischemic disease. No evidence of large-territorial acute infarction. No parenchymal hemorrhage. No mass lesion. No extra-axial collection. No mass effect or midline shift. No hydrocephalus. Basilar cisterns are patent. Vascular: No hyperdense vessel. Skull: No acute fracture or focal lesion. Sinuses/Orbits: Paranasal sinuses and mastoid air cells are clear. The orbits are unremarkable. Other: None. IMPRESSION: No acute intracranial abnormality. Electronically Signed   By: Tish Frederickson M.D.   On: 12/30/2020 20:20     Scheduled Meds: . chlordiazePOXIDE  25 mg Oral TID  . enoxaparin (LOVENOX) injection  40 mg Subcutaneous Q24H  . folic acid  1 mg Oral Daily  . multivitamin with minerals  1 tablet Oral Daily  . nicotine  14 mg Transdermal Daily  . pantoprazole (PROTONIX) IV  40 mg Intravenous Q24H  . thiamine  100 mg Oral Daily   Or  . thiamine  100 mg Intravenous Daily   Continuous Infusions: . sodium chloride 140 mL/hr at 12/31/20 0740  . vancomycin Stopped (12/31/20 1334)     LOS: 0 days   Time spent: 38 mins   Shahla Betsill Laural Benes, MD How to contact the [facility] Attending or Consulting provider 7A - 7P or covering provider during after hours 7P -7A, for this patient?  1. Check the care team in [facility] and look for a) attending/consulting TRH provider listed and b) the [facility] team listed 2. Log into [email or web address] and use [place]'s universal password to access. If you do not have the password, please contact the hospital operator. 3. Locate the [facility] provider you are looking for under [facility] and page to a number that you can be directly reached. 4. If you still have difficulty reaching the provider, please page the [facility] (Director on Call) for the Hospitalists listed on amion for assistance.  12/31/2020, 2:19 PM

## 2020-12-31 NOTE — Progress Notes (Signed)
Patient boyfriend called me to the room and said the patient has been trying to get him to go out and buy beer and cigarettes.  She wants him to sneak them in the hospital for her.  The boyfriend has refused and wanted [place] to know.

## 2020-12-31 NOTE — ED Notes (Signed)
Pt was found out of the bed, naked and all leads and monitoring devices off. Pt said she went outside to urinate although the purewick was still on her and IVF was still attached and was infusing. She was also picking the coban that was on her IV. Pt alert and oriented to self, place and time with some confusion to situation. Pt was cooperative. Dr. Laural Benes updated, he was at bedside. Order was to give Librium ASAP.

## 2020-12-31 NOTE — Progress Notes (Signed)
Patient stated she wants to leave AMA. Discussed in detail the alcohol withdrawal process and the importance of quitting.  She insists on leaving.  Notified Dr. Laural Benes.  Patient signed AMA form (in chart).  Left upper arm IV and left wrist/hand IV removed.

## 2020-12-31 NOTE — Plan of Care (Signed)
  Problem: Health Behavior/Discharge Planning: Goal: Ability to manage health-related needs will improve Outcome: Progressing   Problem: Activity: Goal: Risk for activity intolerance will decrease Outcome: Progressing   

## 2020-12-31 NOTE — Discharge Summary (Signed)
12/31/2020 6:36 PM  RN notified me that patient signed out against medical advise.  Pt had already left when I was notified.  Unfortunately, patient remains high risk for readmission and adverse outcomes.  RN counseled with patient and removed her IVs.    Maryln Manuel MD  How to contact the [facility] Attending or Consulting provider 7A - 7P or covering provider during after hours 7P -7A, for this patient?  1. Check the care team in [facility] and look for a) attending/consulting TRH provider listed and b) the [facility] team listed 2. Log into [email or web address] and use [place]'s universal password to access. If you do not have the password, please contact the hospital operator. 3. Locate the [facility] provider you are looking for under [facility] and page to a number that you can be directly reached. 4. If you still have difficulty reaching the provider, please page the [facility] (Director on Call) for the Hospitalists listed on amion for assistance.

## 2020-12-31 NOTE — ED Notes (Signed)
SDU Breakfast Ordered 

## 2021-01-02 LAB — URINE CULTURE: Culture: >=100000 — AB

## 2021-04-03 ENCOUNTER — Emergency Department (HOSPITAL_COMMUNITY): Payer: Self-pay

## 2021-04-03 ENCOUNTER — Emergency Department (HOSPITAL_COMMUNITY)
Admission: EM | Admit: 2021-04-03 | Discharge: 2021-04-04 | Disposition: A | Discharge status: Home / Self Care | Payer: Self-pay | Attending: Emergency Medicine | Admitting: Emergency Medicine

## 2021-04-03 ENCOUNTER — Encounter (HOSPITAL_COMMUNITY): Payer: Self-pay | Admitting: *Deleted

## 2021-04-03 DIAGNOSIS — S0003XA Contusion of scalp, initial encounter: Secondary | ICD-10-CM | POA: Insufficient documentation

## 2021-04-03 DIAGNOSIS — R7989 Other specified abnormal findings of blood chemistry: Secondary | ICD-10-CM

## 2021-04-03 DIAGNOSIS — R5383 Other fatigue: Secondary | ICD-10-CM | POA: Insufficient documentation

## 2021-04-03 DIAGNOSIS — S5011XA Contusion of right forearm, initial encounter: Secondary | ICD-10-CM | POA: Insufficient documentation

## 2021-04-03 DIAGNOSIS — F10230 Alcohol dependence with withdrawal, uncomplicated: Secondary | ICD-10-CM | POA: Insufficient documentation

## 2021-04-03 DIAGNOSIS — S51812A Laceration without foreign body of left forearm, initial encounter: Secondary | ICD-10-CM | POA: Insufficient documentation

## 2021-04-03 DIAGNOSIS — F17210 Nicotine dependence, cigarettes, uncomplicated: Secondary | ICD-10-CM | POA: Insufficient documentation

## 2021-04-03 DIAGNOSIS — Z23 Encounter for immunization: Secondary | ICD-10-CM | POA: Insufficient documentation

## 2021-04-03 DIAGNOSIS — R7401 Elevation of levels of liver transaminase levels: Secondary | ICD-10-CM | POA: Insufficient documentation

## 2021-04-03 DIAGNOSIS — Y900 Blood alcohol level of less than 20 mg/100 ml: Secondary | ICD-10-CM | POA: Insufficient documentation

## 2021-04-03 LAB — RAPID URINE DRUG SCREEN, HOSP PERFORMED
Amphetamines: NOT DETECTED
Barbiturates: NOT DETECTED
Benzodiazepines: NOT DETECTED
Cocaine: NOT DETECTED
Opiates: NOT DETECTED
Tetrahydrocannabinol: POSITIVE — AB

## 2021-04-03 LAB — COMPREHENSIVE METABOLIC PANEL
ALT: 57 U/L — ABNORMAL HIGH (ref 0–44)
AST: 260 U/L — ABNORMAL HIGH (ref 15–41)
Albumin: 3.2 g/dL — ABNORMAL LOW (ref 3.5–5.0)
Alkaline Phosphatase: 171 U/L — ABNORMAL HIGH (ref 38–126)
Anion gap: 11 (ref 5–15)
BUN: <5 mg/dL — ABNORMAL LOW (ref 6–20)
CO2: 27 mmol/L (ref 22–32)
Calcium: 9 mg/dL (ref 8.9–10.3)
Chloride: 93 mmol/L — ABNORMAL LOW (ref 98–111)
Creatinine, Ser: 0.66 mg/dL (ref 0.44–1.00)
GFR, Estimated: >60 mL/min (ref >60)
Glucose, Bld: 100 mg/dL — ABNORMAL HIGH (ref 70–99)
Potassium: 3 mmol/L — ABNORMAL LOW (ref 3.5–5.1)
Sodium: 131 mmol/L — ABNORMAL LOW (ref 135–145)
Total Bilirubin: 2.8 mg/dL — ABNORMAL HIGH (ref 0.3–1.2)
Total Protein: 7.4 g/dL (ref 6.5–8.1)

## 2021-04-03 LAB — CBC WITH DIFFERENTIAL/PLATELET
Abs Immature Granulocytes: 0.03 10*3/uL (ref 0.00–0.07)
Basophils Absolute: 0 10*3/uL (ref 0.0–0.1)
Basophils Relative: 1 %
Eosinophils Absolute: 0 10*3/uL (ref 0.0–0.5)
Eosinophils Relative: 1 %
HCT: 30.9 % — ABNORMAL LOW (ref 36.0–46.0)
Hemoglobin: 10.5 g/dL — ABNORMAL LOW (ref 12.0–15.0)
Immature Granulocytes: 1 %
Lymphocytes Relative: 25 %
Lymphs Abs: 1.4 10*3/uL (ref 0.7–4.0)
MCH: 37.6 pg — ABNORMAL HIGH (ref 26.0–34.0)
MCHC: 34 g/dL (ref 30.0–36.0)
MCV: 110.8 fL — ABNORMAL HIGH (ref 80.0–100.0)
Monocytes Absolute: 0.6 10*3/uL (ref 0.1–1.0)
Monocytes Relative: 10 %
Neutro Abs: 3.4 10*3/uL (ref 1.7–7.7)
Neutrophils Relative %: 62 %
Platelets: 115 10*3/uL — ABNORMAL LOW (ref 150–400)
RBC: 2.79 MIL/uL — ABNORMAL LOW (ref 3.87–5.11)
RDW: 14.8 % (ref 11.5–15.5)
WBC: 5.4 10*3/uL (ref 4.0–10.5)
nRBC: 0 % (ref 0.0–0.2)

## 2021-04-03 LAB — ETHANOL: Alcohol, Ethyl (B): <10 mg/dL (ref <10)

## 2021-04-03 LAB — ACETAMINOPHEN LEVEL: Acetaminophen (Tylenol), Serum: <10 ug/mL — ABNORMAL LOW (ref 10–30)

## 2021-04-03 LAB — SALICYLATE LEVEL: Salicylate Lvl: <7 mg/dL — ABNORMAL LOW (ref 7.0–30.0)

## 2021-04-03 MED ORDER — TETANUS-DIPHTH-ACELL PERTUSSIS 5-2.5-18.5 LF-MCG/0.5 IM SUSY: 0.5000 mL | PREFILLED_SYRINGE | Freq: Once | INTRAMUSCULAR | Status: DC
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
## 2021-04-04 MED ORDER — CHLORDIAZEPOXIDE HCL 25 MG PO CAPS
100.0000 mg | ORAL_CAPSULE | Freq: Once | ORAL | Status: AC
  Administered 2021-04-04: 100 mg via ORAL
  Filled 2021-04-04: qty 4

## 2021-04-04 MED ORDER — TETANUS-DIPHTH-ACELL PERTUSSIS 5-2.5-18.5 LF-MCG/0.5 IM SUSY
0.5000 mL | PREFILLED_SYRINGE | Freq: Once | INTRAMUSCULAR | Status: AC
  Administered 2021-04-04: 0.5 mL via INTRAMUSCULAR
  Filled 2021-04-04: qty 0.5

## 2021-04-04 MED ORDER — CHLORDIAZEPOXIDE HCL 25 MG PO CAPS: ORAL_CAPSULE | ORAL | 0 refills | Status: DC

## 2021-04-04 MED ORDER — LORAZEPAM 1 MG PO TABS
2.0000 mg | ORAL_TABLET | Freq: Once | ORAL | Status: AC
  Administered 2021-04-04: 2 mg via ORAL
  Filled 2021-04-04: qty 2
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
## 2021-04-04 NOTE — ED Notes (Signed)
Patient verbalizes understanding of discharge instructions. Opportunity for questioning and answers were provided. Armband removed by staff, pt discharged from ED ambulatory. Pt given bus pass, coke, and [place] sandwich bag.

## 2021-04-04 NOTE — Discharge Instructions (Signed)
Return for worsening withdrawal. Follow up with an outpatient center.  Let your doctor know how you are doing.  Follow up with GI for your liver.

## 2021-04-20 ENCOUNTER — Other Ambulatory Visit: Payer: Self-pay

## 2021-04-20 ENCOUNTER — Emergency Department (HOSPITAL_COMMUNITY): Payer: Self-pay

## 2021-04-20 ENCOUNTER — Emergency Department (HOSPITAL_COMMUNITY)
Admission: EM | Admit: 2021-04-20 | Discharge: 2021-04-20 | Disposition: A | Discharge status: Home / Self Care | Payer: Self-pay | Attending: Emergency Medicine | Admitting: Emergency Medicine

## 2021-04-20 ENCOUNTER — Encounter (HOSPITAL_COMMUNITY): Payer: Self-pay | Admitting: Emergency Medicine

## 2021-04-20 DIAGNOSIS — Z5900 Homelessness unspecified: Secondary | ICD-10-CM | POA: Insufficient documentation

## 2021-04-20 DIAGNOSIS — F17210 Nicotine dependence, cigarettes, uncomplicated: Secondary | ICD-10-CM | POA: Insufficient documentation

## 2021-04-20 DIAGNOSIS — S0003XA Contusion of scalp, initial encounter: Secondary | ICD-10-CM | POA: Insufficient documentation

## 2021-04-20 DIAGNOSIS — R000 Tachycardia, unspecified: Secondary | ICD-10-CM | POA: Insufficient documentation

## 2021-04-20 DIAGNOSIS — S0990XA Unspecified injury of head, initial encounter: Secondary | ICD-10-CM

## 2021-04-20 NOTE — ED Notes (Signed)
Patient transported to CT 
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
## 2021-04-20 NOTE — Discharge Instructions (Addendum)
Your CT scan did not show signs of any injury to your brain.  Please continue to try to drink plenty of water.  We talked about alcohol withdrawal and how to slowly taper down your drinking, but not to stop cold [place].  You should start by cutting back how much you drink in half every day.  This is to prevent seizures from happening.
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
## 2021-04-20 NOTE — ED Notes (Signed)
Pt verbalizes understanding of discharge instructions. Opportunity for questions and answers were provided. Pt discharged from the ED.   ?
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
## 2021-04-20 NOTE — ED Notes (Signed)
Patient given a sandwich and drink 

## 2021-04-25 ENCOUNTER — Emergency Department (HOSPITAL_COMMUNITY)
Admission: EM | Admit: 2021-04-25 | Discharge: 2021-04-25 | Disposition: A | Discharge status: Home / Self Care | Payer: Self-pay | Attending: Emergency Medicine | Admitting: Emergency Medicine

## 2021-04-25 ENCOUNTER — Encounter (HOSPITAL_COMMUNITY): Payer: Self-pay

## 2021-04-25 ENCOUNTER — Other Ambulatory Visit: Payer: Self-pay

## 2021-04-25 ENCOUNTER — Emergency Department (HOSPITAL_COMMUNITY): Payer: Self-pay

## 2021-04-25 DIAGNOSIS — Z79899 Other long term (current) drug therapy: Secondary | ICD-10-CM | POA: Insufficient documentation

## 2021-04-25 DIAGNOSIS — F17210 Nicotine dependence, cigarettes, uncomplicated: Secondary | ICD-10-CM | POA: Insufficient documentation

## 2021-04-25 DIAGNOSIS — F1010 Alcohol abuse, uncomplicated: Secondary | ICD-10-CM | POA: Insufficient documentation

## 2021-04-25 DIAGNOSIS — Y908 Blood alcohol level of 240 mg/100 ml or more: Secondary | ICD-10-CM | POA: Insufficient documentation

## 2021-04-25 DIAGNOSIS — S0990XA Unspecified injury of head, initial encounter: Secondary | ICD-10-CM | POA: Insufficient documentation

## 2021-04-25 LAB — CBC WITH DIFFERENTIAL/PLATELET
Abs Immature Granulocytes: 0.01 10*3/uL (ref 0.00–0.07)
Basophils Absolute: 0.1 10*3/uL (ref 0.0–0.1)
Basophils Relative: 1 %
Eosinophils Absolute: 0.3 10*3/uL (ref 0.0–0.5)
Eosinophils Relative: 6 %
HCT: 35.4 % — ABNORMAL LOW (ref 36.0–46.0)
Hemoglobin: 11.8 g/dL — ABNORMAL LOW (ref 12.0–15.0)
Immature Granulocytes: 0 %
Lymphocytes Relative: 49 %
Lymphs Abs: 2.6 10*3/uL (ref 0.7–4.0)
MCH: 38.3 pg — ABNORMAL HIGH (ref 26.0–34.0)
MCHC: 33.3 g/dL (ref 30.0–36.0)
MCV: 114.9 fL — ABNORMAL HIGH (ref 80.0–100.0)
Monocytes Absolute: 0.3 10*3/uL (ref 0.1–1.0)
Monocytes Relative: 6 %
Neutro Abs: 2 10*3/uL (ref 1.7–7.7)
Neutrophils Relative %: 38 %
Platelets: 93 10*3/uL — ABNORMAL LOW (ref 150–400)
RBC: 3.08 MIL/uL — ABNORMAL LOW (ref 3.87–5.11)
RDW: 14 % (ref 11.5–15.5)
WBC: 5.3 10*3/uL (ref 4.0–10.5)
nRBC: 0 % (ref 0.0–0.2)

## 2021-04-25 LAB — COMPREHENSIVE METABOLIC PANEL
ALT: 46 U/L — ABNORMAL HIGH (ref 0–44)
AST: 261 U/L — ABNORMAL HIGH (ref 15–41)
Albumin: 3.2 g/dL — ABNORMAL LOW (ref 3.5–5.0)
Alkaline Phosphatase: 139 U/L — ABNORMAL HIGH (ref 38–126)
Anion gap: 10 (ref 5–15)
BUN: <5 mg/dL — ABNORMAL LOW (ref 6–20)
CO2: 25 mmol/L (ref 22–32)
Calcium: 8.2 mg/dL — ABNORMAL LOW (ref 8.9–10.3)
Chloride: 107 mmol/L (ref 98–111)
Creatinine, Ser: 0.48 mg/dL (ref 0.44–1.00)
GFR, Estimated: >60 mL/min (ref >60)
Glucose, Bld: 93 mg/dL (ref 70–99)
Potassium: 3.5 mmol/L (ref 3.5–5.1)
Sodium: 142 mmol/L (ref 135–145)
Total Bilirubin: 1 mg/dL (ref 0.3–1.2)
Total Protein: 7.1 g/dL (ref 6.5–8.1)

## 2021-04-25 LAB — CK: Total CK: 153 U/L (ref 38–234)

## 2021-04-25 LAB — ETHANOL: Alcohol, Ethyl (B): 372 mg/dL (ref <10)

## 2021-04-25 MED ORDER — BACITRACIN ZINC 500 UNIT/GM EX OINT
TOPICAL_OINTMENT | CUTANEOUS | Status: AC
  Filled 2021-04-25: qty 4.5

## 2021-04-25 MED ORDER — ONDANSETRON 4 MG PO TBDP
4.0000 mg | ORAL_TABLET | Freq: Once | ORAL | Status: AC
  Administered 2021-04-25: 4 mg via ORAL
  Filled 2021-04-25: qty 1

## 2021-04-25 NOTE — ED Notes (Signed)
Patient ambulated to the bathroom. Patient was given refreshments to eat and drink.

## 2021-04-25 NOTE — ED Notes (Signed)
To Ct 
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
## 2021-04-25 NOTE — ED Notes (Signed)
During discharge pt states her issues were not addressed while she was here; when asked how pt got here she states "ems, I guess"; pt c/o going through withdrawals and has a rash to her legs; EDP made aware and seen pt; orders given and pt given cup of water upon request; pt informed she can wait in waiting room due to no place to go
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
## 2021-05-11 ENCOUNTER — Encounter (HOSPITAL_COMMUNITY): Payer: Self-pay | Admitting: *Deleted

## 2021-05-11 ENCOUNTER — Inpatient Hospital Stay (HOSPITAL_COMMUNITY)
Admission: EM | Admit: 2021-05-11 | Discharge: 2021-05-15 | DRG: 897 | Attending: Internal Medicine | Admitting: Internal Medicine

## 2021-05-11 ENCOUNTER — Other Ambulatory Visit: Payer: Self-pay

## 2021-05-11 ENCOUNTER — Inpatient Hospital Stay (HOSPITAL_COMMUNITY)

## 2021-05-11 DIAGNOSIS — R17 Unspecified jaundice: Secondary | ICD-10-CM | POA: Diagnosis present

## 2021-05-11 DIAGNOSIS — F10232 Alcohol dependence with withdrawal with perceptual disturbance: Principal | ICD-10-CM | POA: Diagnosis present

## 2021-05-11 DIAGNOSIS — N179 Acute kidney failure, unspecified: Secondary | ICD-10-CM | POA: Diagnosis present

## 2021-05-11 DIAGNOSIS — K029 Dental caries, unspecified: Secondary | ICD-10-CM | POA: Diagnosis present

## 2021-05-11 DIAGNOSIS — M272 Inflammatory conditions of jaws: Secondary | ICD-10-CM | POA: Diagnosis present

## 2021-05-11 DIAGNOSIS — D539 Nutritional anemia, unspecified: Secondary | ICD-10-CM | POA: Diagnosis present

## 2021-05-11 DIAGNOSIS — E871 Hypo-osmolality and hyponatremia: Secondary | ICD-10-CM | POA: Diagnosis present

## 2021-05-11 DIAGNOSIS — R9431 Abnormal electrocardiogram [ECG] [EKG]: Secondary | ICD-10-CM | POA: Diagnosis present

## 2021-05-11 DIAGNOSIS — F1020 Alcohol dependence, uncomplicated: Secondary | ICD-10-CM | POA: Diagnosis present

## 2021-05-11 DIAGNOSIS — Z888 Allergy status to other drugs, medicaments and biological substances status: Secondary | ICD-10-CM

## 2021-05-11 DIAGNOSIS — F10239 Alcohol dependence with withdrawal, unspecified: Secondary | ICD-10-CM | POA: Diagnosis present

## 2021-05-11 DIAGNOSIS — K760 Fatty (change of) liver, not elsewhere classified: Secondary | ICD-10-CM | POA: Diagnosis present

## 2021-05-11 DIAGNOSIS — Z880 Allergy status to penicillin: Secondary | ICD-10-CM | POA: Diagnosis not present

## 2021-05-11 DIAGNOSIS — E876 Hypokalemia: Secondary | ICD-10-CM | POA: Diagnosis present

## 2021-05-11 DIAGNOSIS — Z20822 Contact with and (suspected) exposure to covid-19: Secondary | ICD-10-CM | POA: Diagnosis present

## 2021-05-11 DIAGNOSIS — E861 Hypovolemia: Secondary | ICD-10-CM | POA: Diagnosis present

## 2021-05-11 DIAGNOSIS — F10231 Alcohol dependence with withdrawal delirium: Secondary | ICD-10-CM

## 2021-05-11 DIAGNOSIS — E860 Dehydration: Secondary | ICD-10-CM | POA: Diagnosis present

## 2021-05-11 DIAGNOSIS — F10932 Alcohol use, unspecified with withdrawal with perceptual disturbance: Secondary | ICD-10-CM

## 2021-05-11 DIAGNOSIS — F17210 Nicotine dependence, cigarettes, uncomplicated: Secondary | ICD-10-CM | POA: Diagnosis present

## 2021-05-11 DIAGNOSIS — F10939 Alcohol use, unspecified with withdrawal, unspecified: Secondary | ICD-10-CM | POA: Diagnosis present

## 2021-05-11 DIAGNOSIS — K047 Periapical abscess without sinus: Secondary | ICD-10-CM | POA: Diagnosis present

## 2021-05-11 HISTORY — DX: Unspecified convulsions: R56.9

## 2021-05-11 LAB — CBC WITH DIFFERENTIAL/PLATELET
Abs Immature Granulocytes: 0.04 10*3/uL (ref 0.00–0.07)
Basophils Absolute: 0.1 10*3/uL (ref 0.0–0.1)
Basophils Relative: 1 %
Eosinophils Absolute: 0.1 10*3/uL (ref 0.0–0.5)
Eosinophils Relative: 1 %
HCT: 33.5 % — ABNORMAL LOW (ref 36.0–46.0)
Hemoglobin: 11.2 g/dL — ABNORMAL LOW (ref 12.0–15.0)
Immature Granulocytes: 1 %
Lymphocytes Relative: 25 %
Lymphs Abs: 1.9 10*3/uL (ref 0.7–4.0)
MCH: 38.1 pg — ABNORMAL HIGH (ref 26.0–34.0)
MCHC: 33.4 g/dL (ref 30.0–36.0)
MCV: 113.9 fL — ABNORMAL HIGH (ref 80.0–100.0)
Monocytes Absolute: 1.4 10*3/uL — ABNORMAL HIGH (ref 0.1–1.0)
Monocytes Relative: 18 %
Neutro Abs: 4.1 10*3/uL (ref 1.7–7.7)
Neutrophils Relative %: 54 %
Platelets: 169 10*3/uL (ref 150–400)
RBC: 2.94 MIL/uL — ABNORMAL LOW (ref 3.87–5.11)
RDW: 12.6 % (ref 11.5–15.5)
WBC: 7.6 10*3/uL (ref 4.0–10.5)
nRBC: 0 % (ref 0.0–0.2)

## 2021-05-11 LAB — HEPATITIS PANEL, ACUTE
HCV Ab: NONREACTIVE
Hep A IgM: NONREACTIVE
Hep B C IgM: NONREACTIVE
Hepatitis B Surface Ag: NONREACTIVE

## 2021-05-11 LAB — PROTIME-INR
INR: 1.1 (ref 0.8–1.2)
Prothrombin Time: 13.9 seconds (ref 11.4–15.2)

## 2021-05-11 LAB — MAGNESIUM: Magnesium: 1.7 mg/dL (ref 1.7–2.4)

## 2021-05-11 LAB — COMPREHENSIVE METABOLIC PANEL
ALT: 41 U/L (ref 0–44)
AST: 117 U/L — ABNORMAL HIGH (ref 15–41)
Albumin: 3.1 g/dL — ABNORMAL LOW (ref 3.5–5.0)
Alkaline Phosphatase: 158 U/L — ABNORMAL HIGH (ref 38–126)
Anion gap: 18 — ABNORMAL HIGH (ref 5–15)
BUN: 9 mg/dL (ref 6–20)
CO2: 19 mmol/L — ABNORMAL LOW (ref 22–32)
Calcium: 9.1 mg/dL (ref 8.9–10.3)
Chloride: 99 mmol/L (ref 98–111)
Creatinine, Ser: 1.24 mg/dL — ABNORMAL HIGH (ref 0.44–1.00)
GFR, Estimated: 55 mL/min — ABNORMAL LOW (ref >60)
Glucose, Bld: 76 mg/dL (ref 70–99)
Potassium: 3 mmol/L — ABNORMAL LOW (ref 3.5–5.1)
Sodium: 136 mmol/L (ref 135–145)
Total Bilirubin: 3.2 mg/dL — ABNORMAL HIGH (ref 0.3–1.2)
Total Protein: 7.4 g/dL (ref 6.5–8.1)

## 2021-05-11 LAB — RESP PANEL BY RT-PCR (FLU A&B, COVID) ARPGX2
Influenza A by PCR: NEGATIVE
Influenza B by PCR: NEGATIVE
SARS Coronavirus 2 by RT PCR: NEGATIVE

## 2021-05-11 LAB — RAPID URINE DRUG SCREEN, HOSP PERFORMED
Amphetamines: NOT DETECTED
Barbiturates: NOT DETECTED
Benzodiazepines: POSITIVE — AB
Cocaine: NOT DETECTED
Opiates: NOT DETECTED
Tetrahydrocannabinol: NOT DETECTED

## 2021-05-11 LAB — AMMONIA: Ammonia: 29 umol/L (ref 9–35)

## 2021-05-11 LAB — PHOSPHORUS: Phosphorus: 4 mg/dL (ref 2.5–4.6)

## 2021-05-11 LAB — ETHANOL: Alcohol, Ethyl (B): <10 mg/dL (ref <10)

## 2021-05-11 LAB — SALICYLATE LEVEL: Salicylate Lvl: <7 mg/dL — ABNORMAL LOW (ref 7.0–30.0)

## 2021-05-11 LAB — ACETAMINOPHEN LEVEL: Acetaminophen (Tylenol), Serum: 21 ug/mL (ref 10–30)

## 2021-05-11 MED ORDER — HYDROCODONE-ACETAMINOPHEN 5-325 MG PO TABS
1.0000 | ORAL_TABLET | Freq: Four times a day (QID) | ORAL | Status: DC | PRN
  Administered 2021-05-11 – 2021-05-13 (×7): 1 via ORAL
  Filled 2021-05-11 (×7): qty 1

## 2021-05-11 MED ORDER — IOHEXOL 300 MG/ML  SOLN
75.0000 mL | Freq: Once | INTRAMUSCULAR | Status: AC | PRN
  Administered 2021-05-11: 75 mL via INTRAVENOUS

## 2021-05-11 MED ORDER — LORAZEPAM 2 MG/ML IJ SOLN: 0.0000 mg | Freq: Three times a day (TID) | INTRAMUSCULAR | Status: DC

## 2021-05-11 MED ORDER — LORAZEPAM 2 MG/ML IJ SOLN
1.0000 mg | INTRAMUSCULAR | Status: DC | PRN
  Administered 2021-05-12: 2 mg via INTRAVENOUS
  Administered 2021-05-13 – 2021-05-15 (×2): 1 mg via INTRAVENOUS
  Filled 2021-05-11 (×3): qty 1

## 2021-05-11 MED ORDER — LORAZEPAM 2 MG/ML IJ SOLN
0.0000 mg | INTRAMUSCULAR | Status: DC
  Administered 2021-05-11 (×2): 1 mg via INTRAVENOUS
  Administered 2021-05-11: 2 mg via INTRAVENOUS
  Filled 2021-05-11 (×3): qty 1

## 2021-05-11 MED ORDER — LORAZEPAM 1 MG PO TABS: 1.0000 mg | ORAL_TABLET | ORAL | Status: DC | PRN

## 2021-05-11 MED ORDER — ADULT MULTIVITAMIN W/MINERALS CH
1.0000 | ORAL_TABLET | Freq: Every day | ORAL | Status: DC
  Administered 2021-05-11 – 2021-05-15 (×5): 1 via ORAL
  Filled 2021-05-11 (×5): qty 1

## 2021-05-11 MED ORDER — LORAZEPAM 1 MG PO TABS
1.0000 mg | ORAL_TABLET | ORAL | Status: DC | PRN
  Filled 2021-05-11: qty 2

## 2021-05-11 MED ORDER — FOLIC ACID 1 MG PO TABS
1.0000 mg | ORAL_TABLET | Freq: Every day | ORAL | Status: DC
  Administered 2021-05-11 – 2021-05-15 (×5): 1 mg via ORAL
  Filled 2021-05-11 (×5): qty 1

## 2021-05-11 MED ORDER — LORAZEPAM 2 MG/ML IJ SOLN
1.0000 mg | INTRAMUSCULAR | Status: DC | PRN
Start: 2021-05-11 — End: 2021-05-11

## 2021-05-11 MED ORDER — POTASSIUM CHLORIDE CRYS ER 20 MEQ PO TBCR
20.0000 meq | EXTENDED_RELEASE_TABLET | Freq: Once | ORAL | Status: AC
  Administered 2021-05-11: 20 meq via ORAL
  Filled 2021-05-11: qty 1

## 2021-05-11 MED ORDER — MAGNESIUM SULFATE IN D5W 1-5 GM/100ML-% IV SOLN
1.0000 g | Freq: Once | INTRAVENOUS | Status: DC
  Filled 2021-05-11: qty 100

## 2021-05-11 MED ORDER — LORAZEPAM 2 MG/ML IJ SOLN
1.0000 mg | Freq: Once | INTRAMUSCULAR | Status: AC
  Administered 2021-05-11: 1 mg via INTRAVENOUS
  Filled 2021-05-11: qty 1

## 2021-05-11 MED ORDER — CHLORDIAZEPOXIDE HCL 25 MG PO CAPS
25.0000 mg | ORAL_CAPSULE | Freq: Three times a day (TID) | ORAL | Status: DC
  Administered 2021-05-11 – 2021-05-14 (×10): 25 mg via ORAL
  Filled 2021-05-11 (×10): qty 1

## 2021-05-11 MED ORDER — SODIUM CHLORIDE 0.9 % IV SOLN: INTRAVENOUS | Status: AC

## 2021-05-11 MED ORDER — ENOXAPARIN SODIUM 40 MG/0.4ML IJ SOSY
40.0000 mg | PREFILLED_SYRINGE | INTRAMUSCULAR | Status: DC
  Administered 2021-05-11 – 2021-05-14 (×4): 40 mg via SUBCUTANEOUS
  Filled 2021-05-11 (×4): qty 0.4

## 2021-05-11 MED ORDER — POTASSIUM CHLORIDE 10 MEQ/100ML IV SOLN
10.0000 meq | Freq: Once | INTRAVENOUS | Status: AC
  Administered 2021-05-11: 10 meq via INTRAVENOUS
  Filled 2021-05-11: qty 100

## 2021-05-11 MED ORDER — MAGNESIUM SULFATE 2 GM/50ML IV SOLN
2.0000 g | Freq: Once | INTRAVENOUS | Status: AC
  Administered 2021-05-11: 2 g via INTRAVENOUS
  Filled 2021-05-11: qty 50

## 2021-05-11 MED ORDER — THIAMINE HCL 100 MG/ML IJ SOLN
100.0000 mg | Freq: Every day | INTRAMUSCULAR | Status: DC
  Administered 2021-05-11 – 2021-05-14 (×4): 100 mg via INTRAVENOUS
  Filled 2021-05-11 (×4): qty 2

## 2021-05-11 MED ORDER — POTASSIUM CHLORIDE CRYS ER 20 MEQ PO TBCR
40.0000 meq | EXTENDED_RELEASE_TABLET | Freq: Once | ORAL | Status: AC
  Administered 2021-05-11: 40 meq via ORAL
  Filled 2021-05-11: qty 2

## 2021-05-11 MED ORDER — CLINDAMYCIN PHOSPHATE 600 MG/50ML IV SOLN
600.0000 mg | Freq: Three times a day (TID) | INTRAVENOUS | Status: DC
  Administered 2021-05-11 – 2021-05-15 (×12): 600 mg via INTRAVENOUS
  Filled 2021-05-11 (×16): qty 50

## 2021-05-11 MED ORDER — LORAZEPAM 2 MG/ML IJ SOLN
2.0000 mg | INTRAMUSCULAR | Status: DC | PRN
  Administered 2021-05-12: 2 mg via INTRAVENOUS
  Filled 2021-05-11: qty 1

## 2021-05-11 NOTE — Progress Notes (Addendum)
Pharmacy Antibiotic Note  Miranda Wood is a 45 y.o. female admitted on 05/11/2021 with  oral abscess .  Pharmacy has been consulted for clindamycin dosing.   Tmax 99.5, wbc within normal limits. SCr elevated at 1.24 (baseline appears to be ~ 0.5-0.6). Having urine output. Jaw severely swollen and indurated; too tender to touch and limits mouth opening.  Plan: Clindamycin 600 mg q8h for oral abscess from left jaw. Patient had severe penicillin allergy and could not receive Unasyn per consult.  Height: 5\' 8"  (172.7 cm) Weight: 60.7 kg (133 lb 14.4 oz) IBW/kg (Calculated) : 63.9  Temp (24hrs), Avg:99.1 F (37.3 C), Min:98.7 F (37.1 C), Max:99.5 F (37.5 C)  Recent Labs  Lab 05/11/21 0332  WBC 7.6  CREATININE 1.24*     Estimated Creatinine Clearance: 56.1 mL/min (A) (by C-G formula based on SCr of 1.24 mg/dL (H)).    Allergies  Allergen Reactions   Penicillins Shortness Of Breath    Has patient had a PCN reaction causing immediate rash, facial/tongue/throat swelling, SOB or lightheadedness with hypotension: Yes Has patient had a PCN reaction causing severe rash involving mucus membranes or skin necrosis: No Has patient had a PCN reaction that required hospitalization: No Has patient had a PCN reaction occurring within the last 10 years: No If all of the above answers are "NO", then may proceed with Cephalosporin use.    Benadryl [Diphenhydramine] Hives    Hives per patient    Antimicrobials this admission: Clindamycin 7/14 >>   Dosing considerations: She has limited ability to open her mouth, so kept her on IV antibiotics.  Microbiology results: None  Thank you for allowing pharmacy to be a part of this patient's care.  8/14, PharmD PGY1 Pharmacy Resident  Please check AMION for all [facility] pharmacy phone numbers After 10:00 PM call main pharmacy [phone]

## 2021-05-11 NOTE — ED Notes (Signed)
Breakfast Ordered 
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
## 2021-05-11 NOTE — ED Triage Notes (Signed)
Patient presents to ed via [facility] sheriff [facility] the [facility], c/o hallucinations and feeling jittery, states she drinks everyday and hasn't had a drink in 2 days. Patient is alert and oriented.

## 2021-05-11 NOTE — Progress Notes (Signed)
   05/11/21 2310  Assess: MEWS Score  Temp 98.7 F (37.1 C)  BP 117/85  Pulse Rate 88  Resp 18  SpO2 100 %  O2 Device Room Air  Assess: MEWS Score  MEWS Temp 0  MEWS Systolic 0  MEWS Pulse 0  MEWS RR 0  MEWS LOC 1  MEWS Score 1  MEWS Score Color Green  Assess: if the MEWS score is Yellow or Red  Were vital signs taken at a resting state? Yes  Focused Assessment No change from prior assessment  Early Detection of Sepsis Score *See Row Information* Low  MEWS guidelines implemented *See Row Information* No, other (Comment) (pt was resting)  Document  Patient Outcome Stabilized after interventions  Progress note created (see row info) Yes

## 2021-05-11 NOTE — Progress Notes (Signed)
Patient arrived from ED. Unable to answer questions, patient is lethargic. Patient does c/o of facial pain, 9/10. K. Denton Lank, MD notified via [facility] communication. Officer at bedside.

## 2021-05-11 NOTE — Progress Notes (Signed)
   05/11/21 1936  Assess: MEWS Score  Temp 98.5 F (36.9 C)  BP 107/83  Pulse Rate (!) 105  Resp 16  SpO2 100 %  Assess: MEWS Score  MEWS Temp 0  MEWS Systolic 0  MEWS Pulse 1  MEWS RR 0  MEWS LOC 1  MEWS Score 2  MEWS Score Color Yellow  Assess: if the MEWS score is Yellow or Red  Were vital signs taken at a resting state? Yes  Focused Assessment No change from prior assessment  Early Detection of Sepsis Score *See Row Information* Low  MEWS guidelines implemented *See Row Information* No, other (Comment) (pt was eating at the time of VS)  Document  Patient Outcome Stabilized after interventions  Progress note created (see row info) Yes

## 2021-05-11 NOTE — ED Provider Notes (Signed)
[facility] EMERGENCY DEPARTMENT Provider Note   CSN: [id number] Arrival date & time: 05/11/21  0259     History Chief Complaint  Patient presents with   Medical Clearance    Miranda Wood is a 45 y.o. female. Level 5 caveat due to altered mental status. HPI Patient brought in from jail.  She is a heavy drinker.  Normally drinks 3 to 440 ounce beers a day.  Last drink 2 days ago before she went to jail.  However now having reported hallucinations.  Reported confusions.  Reportedly vital signs "all over the place at the jail.  Patient really cannot tell me why she is here and does not know why they sent her in.  Question of previous seizures with alcohol withdrawal.  Denies other drug use besides the alcohol.  Denies headaches.  Patient appears to have some mild confusion.    Past Medical History:  Diagnosis Date   Anxiety    Blood transfusion without reported diagnosis    Complication of anesthesia    HR and B/P were elevated with epidural   Seizures [facility])     Patient Active Problem List   Diagnosis Date Noted   Acute hyperactive alcohol withdrawal delirium (HCC) 12/31/2020   Hypomagnesemia 12/31/2020   Hyperbilirubinemia 12/31/2020   Infected sebaceous cyst of back 12/31/2020   UTI (urinary tract infection) 12/31/2020   Alcohol withdrawal seizure (HCC) 12/30/2020   Alcohol dependence (HCC) 12/30/2020   Nausea and vomiting 12/30/2020   Tobacco use 12/30/2020   Thrombocytopenia (HCC) 12/30/2020   Seizure (HCC) 12/30/2020   Alcohol dependence with withdrawal with complication (HCC) 04/29/2018   Major depressive disorder, 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  Social History   Tobacco Use   Smoking status: Every Day    Packs/day: 0.50    Types: Cigarettes    Last attempt to quit: 10/29/2012    Years since quitting: 8.5   Smokeless tobacco: Never  Substance Use Topics   Alcohol use: Yes   Drug use: No    Home Medications Prior to Admission medications   Medication Sig Start Date End Date Taking? Authorizing Provider  chlordiazePOXIDE (LIBRIUM) 25 MG capsule 50mg  PO TID x 1D, then 25-50mg  PO BID X 1D, then 25-50mg  PO QD X 1D 04/04/21   06/04/21, DO  hydrOXYzine (ATARAX/VISTARIL) 25 MG tablet Take 1 tablet (25 mg total) by mouth every 8 (eight) hours as needed for anxiety. Patient not taking: Reported on 12/30/2020 03/19/20   03/21/20, PA-C    Allergies    Penicillins  and Benadryl [diphenhydramine]  Review of Systems   Review of Systems  Constitutional:  Negative for appetite change.  Psychiatric/Behavioral:  Positive for confusion and hallucinations.    Physical Exam Updated Vital Signs BP 107/76   Pulse 85   Temp 98.7 F (37.1 C)   Resp (!) 26   Ht 5\' 8"  (1.727 m)   Wt 64 kg   SpO2 100%   BMI 21.44 kg/m   Physical Exam Vitals and nursing note reviewed.  Constitutional:      Appearance: Normal appearance.  HENT:     Head: Normocephalic.  Eyes:     Extraocular Movements: Extraocular movements intact.     Pupils: Pupils are equal, round, and reactive to light.  Cardiovascular:     Rate and Rhythm: Regular rhythm.  Pulmonary:     Breath sounds: No wheezing or rhonchi.  Abdominal:     Tenderness: There is no abdominal tenderness.  Musculoskeletal:        General: No tenderness.      Cervical back: Neck supple.  Skin:    General: Skin is warm.     Capillary Refill: Capillary refill takes less than 2 seconds.  Neurological:     Mental Status: She is alert.     Comments: Awake and appropriate but somewhat confused to the events that got her here.  Mild tremor on right side.  Left hand is handcuffed to the bed.  Does not appear to be responding to internal stimuli at this time.    ED Results / Procedures / Treatments   Labs (all labs ordered are listed, but only abnormal results are displayed) Labs Reviewed  CBC WITH DIFFERENTIAL/PLATELET - Abnormal; Notable for the following components:      Result Value   RBC 2.94 (*)    Hemoglobin 11.2 (*)    HCT 33.5 (*)    MCV 113.9 (*)    MCH 38.1 (*)    Monocytes Absolute 1.4 (*)    All other components within normal limits  COMPREHENSIVE METABOLIC PANEL - Abnormal; Notable for the following components:   Potassium 3.0 (*)    CO2 19 (*)    Creatinine, Ser 1.24 (*)    Albumin 3.1 (*)    AST 117 (*)    Alkaline Phosphatase 158 (*)    Total Bilirubin 3.2 (*)    GFR, Estimated 55 (*)    Anion gap 18 (*)    All other components within normal limits  RAPID URINE DRUG SCREEN, HOSP PERFORMED - Abnormal; Notable for the following components:   Benzodiazepines POSITIVE (*)    All other components within normal limits  SALICYLATE LEVEL - Abnormal; Notable for the following components:   Salicylate Lvl <7.0 (*)    All other components within normal limits  RESP PANEL BY RT-PCR (FLU A&B, COVID) ARPGX2  ETHANOL  ACETAMINOPHEN LEVEL  MAGNESIUM  PHOSPHORUS  URINALYSIS, ROUTINE W REFLEX MICROSCOPIC  I-STAT BETA HCG BLOOD, ED ([facility], [facility], [facility] ONLY)    EKG EKG Interpretation  Date/Time:  Thursday May 11 2021 03:36:58 EDT Ventricular Rate:  85 PR Interval:  158 QRS Duration: 58 QT Interval:  428 QTC Calculation: 509 R Axis:   69 Text Interpretation: Sinus rhythm with frequent Premature ventricular complexes Nonspecific ST  and T wave abnormality Prolonged QT Abnormal ECG baseline artifact. PVCs are new Confirmed by 03-23-1984 [phone]) on 05/11/2021 4:42:02 AM  Radiology No results found.  Procedures Procedures   Medications Ordered in ED Medications  LORazepam (ATIVAN) injection 1 mg (has no administration in time range)  thiamine (B-1) injection 100 mg (has no administration in time range)  potassium chloride 10 mEq in 100 mL IVPB (has no administration in time range)    ED Course  I have reviewed the triage vital signs and the nursing notes.  Pertinent labs & imaging results that were available during my care of the patient were reviewed by me and considered in my medical decision making (see chart for details).    MDM Rules/Calculators/A&P                          Patient withApparent alcohol withdrawal.  Drinks 3 to 440 ounce beers a day.  Reportedly more confused.  Does have some confusion here.  Reportedly has also been hallucinating.  However vitals are reassuring.  Not tachycardic and not hypotensive.  Lab work shows mildly elevated creatinine and hypokalemia.  Will supplement with fluids and potassium.  Added magnesium and phosphorus testing.  We will give some benzodiazepines to help with withdrawal.  However with the confusion and potential history of seizures I feel patient would benefit from admission to the hospital.  Will discuss with unassigned medicine.  CRITICAL CARE Performed by: Benjiman Core Total critical care time: Critical care time was exclusive of separately billable procedures and treating other patients. Critical care was necessary to treat or prevent imminent or life-threatening deterioration. Critical care was time spent personally by me on the following activities: development of treatment plan with patient and/or surrogate as well as nursing, discussions with consultants, evaluation of patient's response to treatment, examination of patient, obtaining  history from patient or surrogate, ordering and performing treatments and interventions, ordering and review of laboratory studies, ordering and review of radiographic studies, pulse oximetry and re-evaluation of patient's condition.  Final Clinical Impression(s) / ED Diagnoses Final diagnoses:  Alcohol withdrawal syndrome with perceptual disturbance [facility])    Rx / DC Orders ED Discharge Orders     None        Benjiman Core, MD 05/11/21 [phone]

## 2021-05-11 NOTE — ED Provider Notes (Signed)
Emergency Medicine Provider Triage Evaluation Note  Miranda Wood , a 45 y.o. female  was evaluated in triage.  Pt complains of hallucinations, tremors.  Taken into custody at jail yesterday, has not have alcohol for nearly 48 hours.  Has longstanding history of alcohol abuse.  Reports history of withdrawal in the past which felt similar.  Denies recent drug use.  Review of Systems  Positive: Hallucinations, tremors Negative: Chest pain, SOB  Physical Exam  BP 107/82 (BP Location: Right Arm)   Pulse 87   Temp 98.7 F (37.1 C)   Resp 18   SpO2 100%  Gen:   Awake, no distress  Resp:  Normal effort  MSK:   Moves extremities without difficulty Other:  Tremulous in triage  Medical Decision Making  Medically screening exam initiated at 3:11 AM.  Appropriate orders placed.  Dustin Flock was informed that the remainder of the evaluation will be completed by another provider, this initial triage assessment does not replace that evaluation, and the importance of remaining in the ED until their evaluation is complete.  Concern for alcohol withdrawal. VS stable at present.  Labs, EKG.   Garlon Hatchet, PA-C 05/11/21 0315    Benjiman Core, MD 05/11/21 [phone]

## 2021-05-11 NOTE — ED Notes (Signed)
Pt states she normally drinks 3-4 40 oz beers a day. Pt last had a drink 2 days ago before going into custody.

## 2021-05-11 NOTE — Progress Notes (Addendum)
  PROGRESS NOTE    Miranda Wood  GYK:[id number] DOB: 01-Oct-1976 DOA: 05/11/2021  PCP: Lavinia Sharps, NP    LOS - 0    Patient admitted after midnight for alcohol withdrawal, coming from jail, detained on 05/09/21 with hx of drinking three 40 oz beer daily.  Hx of alcohol withdrawal syndrome including seizure in the past.  Noted by jail nurse to have tremors and hallucinating.  Interval subjective: Pt seen in ED holding for a bed, officer present.  Pt restless but interacts appropriately.  She has a very swollen left cheek & jaw that she says is from bad tooth, bother her for months.    She complains of being thirsty and requests drink of water.  Exam: Disheveled-appearing, restless, no acute distress. Heart tachy, regular rhythm Lungs CTAB Extremities in cuffs, no edema, normal tone Abdomen epigastric tenderness  HEENT - left jaw severely swollen and indurated, exquisitely tender on exam, unable to examine oropharynx due to very limiting mouth opening Skin sunburned    Principal Problem:   Alcohol withdrawal (HCC) Active Problems:   Alcohol dependence (HCC)   AKI (acute kidney injury) (HCC)   Hypokalemia   Jaundice   Prolonged QT interval    I have reviewed the full H&P by Dr. Antionette Char in detail, and I agree with the assessment and plan as outlined therein. In addition: --CT maxillofacial to evaluate abscess --Add scheduled Librium for EtOH withdrawal   PM Addendum - CT max/face confirms abscess. --Start empiric Clindamycin (severe PCN allergy) --Consult ENT vs dental tomorrow for additional recs    Time spent: 25 minutes with > 50% at bedside and in coordination of care.    Pennie Banter, DO [facility]   If 7PM-7AM, please contact night-coverage [email or web address] 05/11/2021, 7:28 AM

## 2021-05-12 LAB — COMPREHENSIVE METABOLIC PANEL
ALT: 29 U/L (ref 0–44)
AST: 70 U/L — ABNORMAL HIGH (ref 15–41)
Albumin: 2.2 g/dL — ABNORMAL LOW (ref 3.5–5.0)
Alkaline Phosphatase: 113 U/L (ref 38–126)
Anion gap: 5 (ref 5–15)
BUN: <5 mg/dL — ABNORMAL LOW (ref 6–20)
CO2: 25 mmol/L (ref 22–32)
Calcium: 8.1 mg/dL — ABNORMAL LOW (ref 8.9–10.3)
Chloride: 103 mmol/L (ref 98–111)
Creatinine, Ser: 0.56 mg/dL (ref 0.44–1.00)
GFR, Estimated: >60 mL/min (ref >60)
Glucose, Bld: 98 mg/dL (ref 70–99)
Potassium: 3.4 mmol/L — ABNORMAL LOW (ref 3.5–5.1)
Sodium: 133 mmol/L — ABNORMAL LOW (ref 135–145)
Total Bilirubin: 1.2 mg/dL (ref 0.3–1.2)
Total Protein: 6.4 g/dL — ABNORMAL LOW (ref 6.5–8.1)

## 2021-05-12 LAB — CBC
HCT: 31.8 % — ABNORMAL LOW (ref 36.0–46.0)
Hemoglobin: 10.6 g/dL — ABNORMAL LOW (ref 12.0–15.0)
MCH: 37.7 pg — ABNORMAL HIGH (ref 26.0–34.0)
MCHC: 33.3 g/dL (ref 30.0–36.0)
MCV: 113.2 fL — ABNORMAL HIGH (ref 80.0–100.0)
Platelets: 176 10*3/uL (ref 150–400)
RBC: 2.81 MIL/uL — ABNORMAL LOW (ref 3.87–5.11)
RDW: 12.3 % (ref 11.5–15.5)
WBC: 6 10*3/uL (ref 4.0–10.5)
nRBC: 0 % (ref 0.0–0.2)

## 2021-05-12 LAB — PHOSPHORUS: Phosphorus: 3.1 mg/dL (ref 2.5–4.6)

## 2021-05-12 LAB — MAGNESIUM: Magnesium: 1.5 mg/dL — ABNORMAL LOW (ref 1.7–2.4)

## 2021-05-12 MED ORDER — MAGNESIUM SULFATE 4 GM/100ML IV SOLN
4.0000 g | Freq: Once | INTRAVENOUS | Status: AC
  Administered 2021-05-12: 4 g via INTRAVENOUS
  Filled 2021-05-12: qty 100

## 2021-05-12 NOTE — Progress Notes (Signed)
PROGRESS NOTE    Miranda Wood   EVO:[id number]  DOB: June 25, 1976  PCP: Lavinia Sharps, NP    DOA: 05/11/2021 LOS: 1   Assessment & Plan   Principal Problem:   Alcohol withdrawal (HCC) Active Problems:   Alcohol dependence (HCC)   AKI (acute kidney injury) (HCC)   Hypokalemia   Jaundice   Prolonged QT interval   Alcohol withdrawal syndrome History of alcohol withdrawal seizures Alcohol dependence Uncomplicated course so far. Continue monitoring per CIWA protocol with Ativan as needed. Continue scheduled Librium. Thiamine folic acid multivitamin.  Left mandibular abscess due to dental caries  No signs of systemic infection which currently could be masked by alcohol withdrawal. Continue clindamycin. Discharge on 7 to 14 days oral clindamycin per discussion with on-call dentist. Needs outpatient dentist or at jail for tooth extraction ASAP.  Acute kidney injury -POA with Cr 1.24, up from baseline 0.482 weeks ago.  Likely prerenal azotemia in the setting of dehydration.  Resolved with IV fluids. Monitor renal function.  Hypomagnesemia, hypokalemia -replacing K and Mg. Monitor and replace as needed.  History of hepatic steatosis -due to alcohol abuse Transaminitis -most likely due to alcohol abuse; LFTs improving.  Monitor CMP.  Prolonged QT interval -QTc on admission 509 ms. Maintain K around 4 and mag around 2. Minimize QT prolonging medications. Repeat EKG. monitor on telemetry.  Patient BMI: Body mass index is 20.25 kg/m.   DVT prophylaxis: enoxaparin (LOVENOX) injection 40 mg Start: 05/11/21 1600   Diet:  Diet Orders (From admission, onward)     Start     Ordered   05/11/21 0512  Diet Heart Room service appropriate? Yes; Fluid consistency: Thin  Diet effective now       Question Answer Comment  Room service appropriate? Yes   Fluid consistency: Thin      05/11/21 0512              Code Status: Full Code   Brief Narrative / Hospital  Course to Date:   45 y.o. female with medical history significant for alcoholism, history of alcohol withdrawal seizure, depression, and anxiety, presented to the ED with tremors and visual hallucinations due to alcohol withdrawal syndrome.  Reportedly last drink 7/12.  Subjective 05/12/21    Patient awake but drowsy when seen today.  She still having a great deal of left-sided jaw pain from abscess.  Denies fevers or chills.  Hand still tremulous.  No other acute complaints.   Disposition Plan & Communication   Status is: Inpatient  Remains inpatient appropriate because:Inpatient level of care appropriate due to severity of illness  Dispo: The patient is from:  Correctional facility              Anticipated d/c is to:  Correctional facility              Patient currently is not medically stable to d/c.   Difficult to place patient No    Consults, Procedures, Significant Events   Consultants:  None  Procedures:  None  Antimicrobials:  Anti-infectives (From admission, onward)    Start     Dose/Rate Route Frequency Ordered Stop   05/11/21 1930  clindamycin (CLEOCIN) IVPB 600 mg        600 mg 100 mL/hr over 30 Minutes Intravenous Every 8 hours 05/11/21 1906           Micro    Objective   Vitals:   05/12/21 0449 05/12/21 0518 05/12/21 0714 05/12/21 1114  BP: 108/69  99/71 102/77  Pulse: 82  87 72  Resp: 18  17 17   Temp: 99.4 F (37.4 C) 99.3 F (37.4 C) 98.3 F (36.8 C) 98.5 F (36.9 C)  TempSrc: Oral Oral    SpO2: 99%  91% 98%  Weight: 60.4 kg     Height:        Intake/Output Summary (Last 24 hours) at 05/12/2021 1412 Last data filed at 05/12/2021 1307 Gross per 24 hour  Intake 1536.77 ml  Output 425 ml  Net 1111.77 ml   Filed Weights   05/11/21 0312 05/11/21 1747 05/12/21 0449  Weight: 64 kg 60.7 kg 60.4 kg    Physical Exam:  General exam: awake but drowsy, no acute distress HEENT: left mandible swelling stable remains very tender but less red  today, hearing grossly normal  Respiratory system: CTAB, no wheezes, rales or rhonchi, normal respiratory effort. Cardiovascular system: normal S1/S2, RRR, no pedal edema.   Central nervous system: no gross focal neurologic deficits, normal speech Extremities: in cuffs and shackles, normal tone Psychiatry: normal mood, flat affect, no obvious hallucinations  Labs   Data Reviewed: I have personally reviewed following labs and imaging studies  CBC: Recent Labs  Lab 05/11/21 0332 05/12/21 0406  WBC 7.6 6.0  NEUTROABS 4.1  --   HGB 11.2* 10.6*  HCT 33.5* 31.8*  MCV 113.9* 113.2*  PLT 169 176   Basic Metabolic Panel: Recent Labs  Lab 05/11/21 0332 05/11/21 0352 05/12/21 0406  NA 136  --  133*  K 3.0*  --  3.4*  CL 99  --  103  CO2 19*  --  25  GLUCOSE 76  --  98  BUN 9  --  <5*  CREATININE 1.24*  --  0.56  CALCIUM 9.1  --  8.1*  MG  --  1.7 1.5*  PHOS  --  4.0 3.1   GFR: Estimated Creatinine Clearance: 86.5 mL/min (by C-G formula based on SCr of 0.56 mg/dL). Liver Function Tests: Recent Labs  Lab 05/11/21 0332 05/12/21 0406  AST 117* 70*  ALT 41 29  ALKPHOS 158* 113  BILITOT 3.2* 1.2  PROT 7.4 6.4*  ALBUMIN 3.1* 2.2*   No results for input(s): LIPASE, AMYLASE in the last 168 hours. Recent Labs  Lab 05/11/21 0740  AMMONIA 29   Coagulation Profile: Recent Labs  Lab 05/11/21 0740  INR 1.1   Cardiac Enzymes: No results for input(s): CKTOTAL, CKMB, CKMBINDEX, TROPONINI in the last 168 hours. BNP (last 3 results) No results for input(s): PROBNP in the last 8760 hours. HbA1C: No results for input(s): HGBA1C in the last 72 hours. CBG: No results for input(s): GLUCAP in the last 168 hours. Lipid Profile: No results for input(s): CHOL, HDL, LDLCALC, TRIG, CHOLHDL, LDLDIRECT in the last 72 hours. Thyroid Function Tests: No results for input(s): TSH, T4TOTAL, FREET4, T3FREE, THYROIDAB in the last 72 hours. Anemia Panel: No results for input(s): VITAMINB12,  FOLATE, FERRITIN, TIBC, IRON, RETICCTPCT in the last 72 hours. Sepsis Labs: No results for input(s): PROCALCITON, LATICACIDVEN in the last 168 hours.  Recent Results (from the past 240 hour(s))  Resp Panel by RT-PCR (Flu A&B, Covid) Nasopharyngeal Swab     Status: None   Collection Time: 05/11/21  4:50 AM   Specimen: Nasopharyngeal Swab; Nasopharyngeal(NP) swabs in vial transport medium  Result Value Ref Range Status   SARS Coronavirus 2 by RT PCR NEGATIVE NEGATIVE Final    Comment: (NOTE) SARS-CoV-2 target nucleic acids  are NOT DETECTED.  The SARS-CoV-2 RNA is generally detectable in upper respiratory specimens during the acute phase of infection. The lowest concentration of SARS-CoV-2 viral copies this assay can detect is 138 copies/mL. A negative result does not preclude SARS-Cov-2 infection and should not be used as the sole basis for treatment or other patient management decisions. A negative result may occur with  improper specimen collection/handling, submission of specimen other than nasopharyngeal swab, presence of viral mutation(s) within the areas targeted by this assay, and inadequate number of viral copies(<138 copies/mL). A negative result must be combined with clinical observations, patient history, and epidemiological information. The expected result is Negative.  Fact Sheet for Patients:  BloggerCourse.com  Fact Sheet for Healthcare Providers:  SeriousBroker.it  This test is no t yet approved or cleared by the [place] FDA and  has been authorized for detection and/or diagnosis of SARS-CoV-2 by FDA under an Emergency Use Authorization (EUA). This EUA will remain  in effect (meaning this test can be used) for the duration of the COVID-19 declaration under Section 564(b)(1) of the Act, 21 U.S.C.section 360bbb-3(b)(1), unless the authorization is terminated  or revoked sooner.       Influenza A by PCR  NEGATIVE NEGATIVE Final   Influenza B by PCR NEGATIVE NEGATIVE Final    Comment: (NOTE) The Xpert Xpress SARS-CoV-2/FLU/RSV plus assay is intended as an aid in the diagnosis of influenza from Nasopharyngeal swab specimens and should not be used as a sole basis for treatment. Nasal washings and aspirates are unacceptable for Xpert Xpress SARS-CoV-2/FLU/RSV testing.  Fact Sheet for Patients: BloggerCourse.com  Fact Sheet for Healthcare Providers: SeriousBroker.it  This test is not yet approved or cleared by the [place] FDA and has been authorized for detection and/or diagnosis of SARS-CoV-2 by FDA under an Emergency Use Authorization (EUA). This EUA will remain in effect (meaning this test can be used) for the duration of the COVID-19 declaration under Section 564(b)(1) of the Act, 21 U.S.C. section 360bbb-3(b)(1), unless the authorization is terminated or revoked.  Performed at [facility], [address] [place], [place] [address]       Imaging Studies   CT MAXILLOFACIAL W CONTRAST  Result Date: 05/11/2021 CLINICAL DATA:  Maxillary/facial abscess. Swelling to the left side of face with pain. EXAM: CT MAXILLOFACIAL WITH CONTRAST TECHNIQUE: Multidetector CT imaging of the maxillofacial structures was performed with intravenous contrast. Multiplanar CT image reconstructions were also generated. CONTRAST:  102mL OMNIPAQUE IOHEXOL 300 MG/ML  SOLN COMPARISON:  None. FINDINGS: Osseous: No fracture or mandibular dislocation. Orbits: Negative. No traumatic or inflammatory finding. Sinuses: Sinuses are largely clear. Soft tissues: Approximately 1.5 x 2.9 by 2.2 cm fluid collection centered along the left mandibular body laterally, compatible with abscess. Is collection is inseparable from and likely in part within the left masseter muscle. Anteriorly, the left buccinator muscle appears in edematous and may be involved. Surrounding  edema and soft tissue thickening. Inferiorly, there is thickening of the platysma and subcutaneous edema extending into the upper neck. The parotid glands and submandibular glands are unremarkable. Prominent left cervical chain nodes, likely reactive given the above findings. Other: Subjacent to the abscess there is a carious left mandibular molar with small periapical lucency Limited intracranial: No obvious acute abnormality in the visualized brain on limited assessment. IMPRESSION: 1. Findings compatible with a 1.5 x 2.9 by 2.2 cm abscess along the left mandibular body, which is inseparable from and likely in part within the left masseter muscle.  Anteriorly, the left buccinator muscle appears edematous and may also be involved. Surrounding edema and soft tissue thickening which extends into the visualized left upper neck, likely a combination of phlegmon, cellulitis and myositis. 2. Subjacent to the abscess there is a carious left mandibular molar with small periapical lucency, most likely the source of the patient's infection. Electronically Signed   By: Feliberto Harts MD   On: 05/11/2021 14:12     Medications   Scheduled Meds:  chlordiazePOXIDE  25 mg Oral TID   enoxaparin (LOVENOX) injection  40 mg Subcutaneous Q24H   folic acid  1 mg Oral Daily   multivitamin with minerals  1 tablet Oral Daily   thiamine injection  100 mg Intravenous Daily   Continuous Infusions:  clindamycin (CLEOCIN) IV 600 mg (05/12/21 1305)       LOS: 1 day    Time spent: 30 minutes     Pennie Banter, DO [facility]  05/12/2021, 2:12 PM      If 7PM-7AM, please contact night-coverage. How to contact the [facility] Attending or Consulting provider 7A - 7P or covering provider during after hours 7P -7A, for this patient?    Check the care team in [facility] and look for a) attending/consulting [facility] provider listed and b) the [facility] team listed Log into [email or web address] and use [place]'s universal password to  access. If you do not have the password, please contact the hospital operator. Locate the [facility] provider you are looking for under [facility] and page to a number that you can be directly reached. If you still have difficulty reaching the provider, please page the [facility] (Director on Call) for the Hospitalists listed on amion for assistance.

## 2021-05-12 NOTE — Plan of Care (Signed)

## 2021-05-13 DIAGNOSIS — E876 Hypokalemia: Secondary | ICD-10-CM

## 2021-05-13 LAB — COMPREHENSIVE METABOLIC PANEL
ALT: 28 U/L (ref 0–44)
AST: 63 U/L — ABNORMAL HIGH (ref 15–41)
Albumin: 2.2 g/dL — ABNORMAL LOW (ref 3.5–5.0)
Alkaline Phosphatase: 107 U/L (ref 38–126)
Anion gap: 7 (ref 5–15)
BUN: <5 mg/dL — ABNORMAL LOW (ref 6–20)
CO2: 24 mmol/L (ref 22–32)
Calcium: 7.8 mg/dL — ABNORMAL LOW (ref 8.9–10.3)
Chloride: 100 mmol/L (ref 98–111)
Creatinine, Ser: 0.47 mg/dL (ref 0.44–1.00)
GFR, Estimated: >60 mL/min (ref >60)
Glucose, Bld: 111 mg/dL — ABNORMAL HIGH (ref 70–99)
Potassium: 3 mmol/L — ABNORMAL LOW (ref 3.5–5.1)
Sodium: 131 mmol/L — ABNORMAL LOW (ref 135–145)
Total Bilirubin: 1.3 mg/dL — ABNORMAL HIGH (ref 0.3–1.2)
Total Protein: 6 g/dL — ABNORMAL LOW (ref 6.5–8.1)

## 2021-05-13 LAB — CBC
HCT: 30.6 % — ABNORMAL LOW (ref 36.0–46.0)
Hemoglobin: 10.2 g/dL — ABNORMAL LOW (ref 12.0–15.0)
MCH: 37.2 pg — ABNORMAL HIGH (ref 26.0–34.0)
MCHC: 33.3 g/dL (ref 30.0–36.0)
MCV: 111.7 fL — ABNORMAL HIGH (ref 80.0–100.0)
Platelets: 210 10*3/uL (ref 150–400)
RBC: 2.74 MIL/uL — ABNORMAL LOW (ref 3.87–5.11)
RDW: 12.4 % (ref 11.5–15.5)
WBC: 6.3 10*3/uL (ref 4.0–10.5)
nRBC: 0 % (ref 0.0–0.2)

## 2021-05-13 LAB — MAGNESIUM: Magnesium: 1.6 mg/dL — ABNORMAL LOW (ref 1.7–2.4)

## 2021-05-13 MED ORDER — LACTATED RINGERS IV SOLN: INTRAVENOUS | Status: DC

## 2021-05-13 MED ORDER — OXYCODONE HCL 5 MG PO TABS
10.0000 mg | ORAL_TABLET | ORAL | Status: DC | PRN
  Administered 2021-05-13 – 2021-05-15 (×8): 10 mg via ORAL
  Filled 2021-05-13 (×8): qty 2

## 2021-05-13 MED ORDER — MORPHINE SULFATE (PF) 2 MG/ML IV SOLN: 2.0000 mg | INTRAVENOUS | Status: DC | PRN

## 2021-05-13 MED ORDER — HYDROCODONE-ACETAMINOPHEN 5-325 MG PO TABS
1.0000 | ORAL_TABLET | Freq: Four times a day (QID) | ORAL | Status: DC | PRN
  Administered 2021-05-13 – 2021-05-14 (×2): 1 via ORAL
  Filled 2021-05-13 (×3): qty 1

## 2021-05-13 MED ORDER — MAGNESIUM SULFATE 4 GM/100ML IV SOLN
4.0000 g | Freq: Once | INTRAVENOUS | Status: AC
  Administered 2021-05-13: 4 g via INTRAVENOUS
  Filled 2021-05-13: qty 100

## 2021-05-13 MED ORDER — POTASSIUM CHLORIDE CRYS ER 20 MEQ PO TBCR
40.0000 meq | EXTENDED_RELEASE_TABLET | ORAL | Status: AC
  Administered 2021-05-13 (×2): 40 meq via ORAL
  Filled 2021-05-13 (×3): qty 2
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
## 2021-05-13 NOTE — Progress Notes (Addendum)
Law Engineer, manufacturing systems guarding patient present at time of fall.  05/13/21 0533  What Happened  Was fall witnessed? Yes  Who witnessed fall? Lisa  Patients activity before fall bathroom-assisted  Point of contact buttocks  Was patient injured? No  Follow Up  MD notified Yes  Time MD notified [phone]  Family notified No - patient refusal  Additional tests No  Simple treatment Other (comment) (n/a)  Progress note created (see row info) Yes  Adult Fall Risk Assessment  Risk Factor Category (scoring not indicated) Fall has occurred during this admission (document High fall risk)  Patient Fall Risk Level High fall risk  Adult Fall Risk Interventions  Required Bundle Interventions *See Row Information* High fall risk - low, moderate, and high requirements implemented  Additional Interventions Use of appropriate toileting equipment (bedpan, BSC, etc.)  Screening for Fall Injury Risk (To be completed on HIGH fall risk patients) - Assessing Need for Floor Mats  Risk For Fall Injury- Criteria for Floor Mats Previous fall this admission  Will Implement Floor Mats Yes  Vitals  Temp 98.9 F (37.2 C)  Temp Source Oral  BP 116/88  MAP (mmHg) 98  BP Location Left Arm  BP Method Automatic  Patient Position (if appropriate) Sitting  Pulse Rate 96  Pulse Rate Source Monitor  Resp 15  Oxygen Therapy  SpO2 99 %  O2 Device Room Air  Pain Assessment  Pain Scale 0-10  Pain Score 0

## 2021-05-13 NOTE — Progress Notes (Signed)
PROGRESS NOTE    Miranda Wood   WVP:[id number]  DOB: 02-Jun-1976  PCP: Lavinia Sharps, NP    DOA: 05/11/2021 LOS: 2   Assessment & Plan   Principal Problem:   Alcohol withdrawal (HCC) Active Problems:   Alcohol dependence (HCC)   AKI (acute kidney injury) (HCC)   Hypokalemia   Jaundice   Prolonged QT interval   Alcohol withdrawal syndrome History of alcohol withdrawal seizures Alcohol dependence Uncomplicated course so far. Continue monitoring per CIWA with Ativan PRN. Continue scheduled Librium. Thiamine folic acid multivitamin.  Left mandibular abscess due to dental caries  No signs of systemic infection which currently could be masked by alcohol withdrawal. Continue clindamycin. Discharge on 7 to 14 days oral clindamycin per discussion with on-call dentist. Needs outpatient dentist or at jail for tooth extraction ASAP. Soft foods.  Acute kidney injury -POA with Cr 1.24, up from baseline 0.482 weeks ago.  Likely prerenal azotemia in the setting of dehydration.  Resolved with IV fluids. Monitor renal function.  Hyponatremia - suspect hypovolemic due to poor PO intake during withdrawal course.   --started on LR @ 75 cc/hr  Hypomagnesemia, hypokalemia - have been replacing K and Mg daily. Monitor and replace as needed. --4 g IV Mg again today --K oral 40x2 today  History of hepatic steatosis -due to alcohol abuse Transaminitis -most likely due to alcohol abuse; LFTs improving.  Monitor CMP.  Prolonged QT interval - QTc on admission 509 ms. Maintain K > 4 and Mg > 2. Minimize QT prolonging medications. Repeat EKG.  Monitor on telemetry.  Patient BMI: Body mass index is 20.72 kg/m.   DVT prophylaxis: enoxaparin (LOVENOX) injection 40 mg Start: 05/11/21 1600   Diet:  Diet Orders (From admission, onward)     Start     Ordered   05/11/21 0512  Diet Heart Room service appropriate? Yes; Fluid consistency: Thin  Diet effective now       Question  Answer Comment  Room service appropriate? Yes   Fluid consistency: Thin      05/11/21 0512              Code Status: Full Code   Brief Narrative / Hospital Course to Date:   45 y.o. female with medical history significant for alcoholism, history of alcohol withdrawal seizure, depression, and anxiety, presented to the ED with tremors and visual hallucinations due to alcohol withdrawal syndrome.  Reportedly last drink 7/12.  Patient noted to have a large left mandibular abscess due to dental caries.  Treating with clindamycin (severe pcn allergy).   Subjective 05/13/21    Patient still have a lot of left jaw pain from abscess, very restricted mouth opening due to pain and swelling.  No other acute complaints.  Says tremors slowly getting better.  No N/V, F/C or other acute complaints.   Disposition Plan & Communication   Status is: Inpatient  Remains inpatient appropriate because:Inpatient level of care appropriate due to severity of illness. Ongoing EtOH withdrawal and electrolyte abnormalities.   Dispo: The patient is from:  Correctional facility              Anticipated d/c is to:  Correctional facility              Patient currently is not medically stable to d/c.   Difficult to place patient No    Consults, Procedures, Significant Events   Consultants:  None  Procedures:  None  Antimicrobials:  Anti-infectives (From admission, onward)  Start     Dose/Rate Route Frequency Ordered Stop   05/11/21 1930  clindamycin (CLEOCIN) IVPB 600 mg        600 mg 100 mL/hr over 30 Minutes Intravenous Every 8 hours 05/11/21 1906           Micro    Objective   Vitals:   05/13/21 0003 05/13/21 0413 05/13/21 0420 05/13/21 0533  BP: 103/79 98/68  116/88  Pulse: 70 77  96  Resp: 19 16  15   Temp: 99.6 F (37.6 C) 99.2 F (37.3 C)  98.9 F (37.2 C)  TempSrc: Oral Oral  Oral  SpO2: 100% 96%  99%  Weight:   61.8 kg   Height:        Intake/Output Summary (Last  24 hours) at 05/13/2021 0744 Last data filed at 05/13/2021 0537 Gross per 24 hour  Intake 360 ml  Output 1300 ml  Net -940 ml   Filed Weights   05/11/21 1747 05/12/21 0449 05/13/21 0420  Weight: 60.7 kg 60.4 kg 61.8 kg    Physical Exam:  General exam: awake and more alert today, no acute distress HEENT: left mandible swelling appears stable, possibly increased inferior to the mandible, hearing grossly normal  Respiratory system:  normal respiratory effort, on room air. Cardiovascular system: normal S1/S2, RRR, no pedal edema.   Central nervous system: no gross focal neurologic deficits, normal speech   Labs   Data Reviewed: I have personally reviewed following labs and imaging studies  CBC: Recent Labs  Lab 05/11/21 0332 05/12/21 0406 05/13/21 0359  WBC 7.6 6.0 6.3  NEUTROABS 4.1  --   --   HGB 11.2* 10.6* 10.2*  HCT 33.5* 31.8* 30.6*  MCV 113.9* 113.2* 111.7*  PLT 169 176 210   Basic Metabolic Panel: Recent Labs  Lab 05/11/21 0332 05/11/21 0352 05/12/21 0406 05/13/21 0359  NA 136  --  133* 131*  K 3.0*  --  3.4* 3.0*  CL 99  --  103 100  CO2 19*  --  25 24  GLUCOSE 76  --  98 111*  BUN 9  --  <5* <5*  CREATININE 1.24*  --  0.56 0.47  CALCIUM 9.1  --  8.1* 7.8*  MG  --  1.7 1.5* 1.6*  PHOS  --  4.0 3.1  --    GFR: Estimated Creatinine Clearance: 88.5 mL/min (by C-G formula based on SCr of 0.47 mg/dL). Liver Function Tests: Recent Labs  Lab 05/11/21 0332 05/12/21 0406 05/13/21 0359  AST 117* 70* 63*  ALT 41 29 28  ALKPHOS 158* 113 107  BILITOT 3.2* 1.2 1.3*  PROT 7.4 6.4* 6.0*  ALBUMIN 3.1* 2.2* 2.2*   No results for input(s): LIPASE, AMYLASE in the last 168 hours. Recent Labs  Lab 05/11/21 0740  AMMONIA 29   Coagulation Profile: Recent Labs  Lab 05/11/21 0740  INR 1.1   Cardiac Enzymes: No results for input(s): CKTOTAL, CKMB, CKMBINDEX, TROPONINI in the last 168 hours. BNP (last 3 results) No results for input(s): PROBNP in the last  8760 hours. HbA1C: No results for input(s): HGBA1C in the last 72 hours. CBG: No results for input(s): GLUCAP in the last 168 hours. Lipid Profile: No results for input(s): CHOL, HDL, LDLCALC, TRIG, CHOLHDL, LDLDIRECT in the last 72 hours. Thyroid Function Tests: No results for input(s): TSH, T4TOTAL, FREET4, T3FREE, THYROIDAB in the last 72 hours. Anemia Panel: No results for input(s): VITAMINB12, FOLATE, FERRITIN, TIBC, IRON, RETICCTPCT in the last  72 hours. Sepsis Labs: No results for input(s): PROCALCITON, LATICACIDVEN in the last 168 hours.  Recent Results (from the past 240 hour(s))  Resp Panel by RT-PCR (Flu A&B, Covid) Nasopharyngeal Swab     Status: None   Collection Time: 05/11/21  4:50 AM   Specimen: Nasopharyngeal Swab; Nasopharyngeal(NP) swabs in vial transport medium  Result Value Ref Range Status   SARS Coronavirus 2 by RT PCR NEGATIVE NEGATIVE Final    Comment: (NOTE) SARS-CoV-2 target nucleic acids are NOT DETECTED.  The SARS-CoV-2 RNA is generally detectable in upper respiratory specimens during the acute phase of infection. The lowest concentration of SARS-CoV-2 viral copies this assay can detect is 138 copies/mL. A negative result does not preclude SARS-Cov-2 infection and should not be used as the sole basis for treatment or other patient management decisions. A negative result may occur with  improper specimen collection/handling, submission of specimen other than nasopharyngeal swab, presence of viral mutation(s) within the areas targeted by this assay, and inadequate number of viral copies(<138 copies/mL). A negative result must be combined with clinical observations, patient history, and epidemiological information. The expected result is Negative.  Fact Sheet for Patients:  BloggerCourse.com  Fact Sheet for Healthcare Providers:  SeriousBroker.it  This test is no t yet approved or cleared by the  [place] FDA and  has been authorized for detection and/or diagnosis of SARS-CoV-2 by FDA under an Emergency Use Authorization (EUA). This EUA will remain  in effect (meaning this test can be used) for the duration of the COVID-19 declaration under Section 564(b)(1) of the Act, 21 U.S.C.section 360bbb-3(b)(1), unless the authorization is terminated  or revoked sooner.       Influenza A by PCR NEGATIVE NEGATIVE Final   Influenza B by PCR NEGATIVE NEGATIVE Final    Comment: (NOTE) The Xpert Xpress SARS-CoV-2/FLU/RSV plus assay is intended as an aid in the diagnosis of influenza from Nasopharyngeal swab specimens and should not be used as a sole basis for treatment. Nasal washings and aspirates are unacceptable for Xpert Xpress SARS-CoV-2/FLU/RSV testing.  Fact Sheet for Patients: BloggerCourse.com  Fact Sheet for Healthcare Providers: SeriousBroker.it  This test is not yet approved or cleared by the [place] FDA and has been authorized for detection and/or diagnosis of SARS-CoV-2 by FDA under an Emergency Use Authorization (EUA). This EUA will remain in effect (meaning this test can be used) for the duration of the COVID-19 declaration under Section 564(b)(1) of the Act, 21 U.S.C. section 360bbb-3(b)(1), unless the authorization is terminated or revoked.  Performed at [facility], [address], [place], [place] [address]       Imaging Studies   CT MAXILLOFACIAL W CONTRAST  Result Date: 05/11/2021 CLINICAL DATA:  Maxillary/facial abscess. Swelling to the left side of face with pain. EXAM: CT MAXILLOFACIAL WITH CONTRAST TECHNIQUE: Multidetector CT imaging of the maxillofacial structures was performed with intravenous contrast. Multiplanar CT image reconstructions were also generated. CONTRAST:  66mL OMNIPAQUE IOHEXOL 300 MG/ML  SOLN COMPARISON:  None. FINDINGS: Osseous: No fracture or mandibular dislocation.  Orbits: Negative. No traumatic or inflammatory finding. Sinuses: Sinuses are largely clear. Soft tissues: Approximately 1.5 x 2.9 by 2.2 cm fluid collection centered along the left mandibular body laterally, compatible with abscess. Is collection is inseparable from and likely in part within the left masseter muscle. Anteriorly, the left buccinator muscle appears in edematous and may be involved. Surrounding edema and soft tissue thickening. Inferiorly, there is thickening of the platysma and subcutaneous edema  extending into the upper neck. The parotid glands and submandibular glands are unremarkable. Prominent left cervical chain nodes, likely reactive given the above findings. Other: Subjacent to the abscess there is a carious left mandibular molar with small periapical lucency Limited intracranial: No obvious acute abnormality in the visualized brain on limited assessment. IMPRESSION: 1. Findings compatible with a 1.5 x 2.9 by 2.2 cm abscess along the left mandibular body, which is inseparable from and likely in part within the left masseter muscle. Anteriorly, the left buccinator muscle appears edematous and may also be involved. Surrounding edema and soft tissue thickening which extends into the visualized left upper neck, likely a combination of phlegmon, cellulitis and myositis. 2. Subjacent to the abscess there is a carious left mandibular molar with small periapical lucency, most likely the source of the patient's infection. Electronically Signed   By: Feliberto Harts MD   On: 05/11/2021 14:12     Medications   Scheduled Meds:  chlordiazePOXIDE  25 mg Oral TID   enoxaparin (LOVENOX) injection  40 mg Subcutaneous Q24H   folic acid  1 mg Oral Daily   multivitamin with minerals  1 tablet Oral Daily   potassium chloride  40 mEq Oral Q4H   thiamine injection  100 mg Intravenous Daily   Continuous Infusions:  clindamycin (CLEOCIN) IV 600 mg (05/13/21 0332)   lactated ringers     magnesium  sulfate bolus IVPB         LOS: 2 days    Time spent: 30 minutes with > 50% spent at bedside and in coordination of care     Pennie Banter, DO [facility]  05/13/2021, 7:44 AM      If 7PM-7AM, please contact night-coverage. How to contact the [facility] Attending or Consulting provider 7A - 7P or covering provider during after hours 7P -7A, for this patient?    Check the care team in [facility] and look for a) attending/consulting [facility] provider listed and b) the [facility] team listed Log into [email or web address] and use [facility] universal password to access. If you do not have the password, please contact the hospital operator. Locate the [facility] provider you are looking for under [facility] and page to a number that you can be directly reached. If you still have difficulty reaching the provider, please page the [facility] (Director on Call) for the Hospitalists listed on amion for assistance.

## 2021-05-14 DIAGNOSIS — E871 Hypo-osmolality and hyponatremia: Secondary | ICD-10-CM | POA: Diagnosis not present

## 2021-05-14 LAB — CBC
HCT: 32.7 % — ABNORMAL LOW (ref 36.0–46.0)
Hemoglobin: 11 g/dL — ABNORMAL LOW (ref 12.0–15.0)
MCH: 38.2 pg — ABNORMAL HIGH (ref 26.0–34.0)
MCHC: 33.6 g/dL (ref 30.0–36.0)
MCV: 113.5 fL — ABNORMAL HIGH (ref 80.0–100.0)
Platelets: 264 10*3/uL (ref 150–400)
RBC: 2.88 MIL/uL — ABNORMAL LOW (ref 3.87–5.11)
RDW: 12.4 % (ref 11.5–15.5)
WBC: 7 10*3/uL (ref 4.0–10.5)
nRBC: 0 % (ref 0.0–0.2)

## 2021-05-14 LAB — COMPREHENSIVE METABOLIC PANEL
ALT: 30 U/L (ref 0–44)
AST: 74 U/L — ABNORMAL HIGH (ref 15–41)
Albumin: 2.3 g/dL — ABNORMAL LOW (ref 3.5–5.0)
Alkaline Phosphatase: 122 U/L (ref 38–126)
Anion gap: 4 — ABNORMAL LOW (ref 5–15)
BUN: <5 mg/dL — ABNORMAL LOW (ref 6–20)
CO2: 28 mmol/L (ref 22–32)
Calcium: 8.1 mg/dL — ABNORMAL LOW (ref 8.9–10.3)
Chloride: 96 mmol/L — ABNORMAL LOW (ref 98–111)
Creatinine, Ser: 0.54 mg/dL (ref 0.44–1.00)
GFR, Estimated: >60 mL/min (ref >60)
Glucose, Bld: 118 mg/dL — ABNORMAL HIGH (ref 70–99)
Potassium: 4 mmol/L (ref 3.5–5.1)
Sodium: 128 mmol/L — ABNORMAL LOW (ref 135–145)
Total Bilirubin: 0.9 mg/dL (ref 0.3–1.2)
Total Protein: 6.4 g/dL — ABNORMAL LOW (ref 6.5–8.1)

## 2021-05-14 LAB — MAGNESIUM: Magnesium: 1.7 mg/dL (ref 1.7–2.4)

## 2021-05-14 LAB — OSMOLALITY: Osmolality: 278 mOsm/kg (ref 275–295)

## 2021-05-14 MED ORDER — THIAMINE HCL 100 MG PO TABS
100.0000 mg | ORAL_TABLET | Freq: Every day | ORAL | Status: DC
  Administered 2021-05-15: 100 mg via ORAL
  Filled 2021-05-14: qty 1

## 2021-05-14 MED ORDER — CHLORDIAZEPOXIDE HCL 25 MG PO CAPS
25.0000 mg | ORAL_CAPSULE | Freq: Two times a day (BID) | ORAL | Status: DC
  Administered 2021-05-14 – 2021-05-15 (×2): 25 mg via ORAL
  Filled 2021-05-14 (×2): qty 1

## 2021-05-14 NOTE — Progress Notes (Signed)
PROGRESS NOTE    Miranda Wood   LEX:[id number]  DOB: 03/30/1976  PCP: Lavinia Sharps, NP    DOA: 05/11/2021 LOS: 3   Assessment & Plan   Principal Problem:   Alcohol withdrawal (HCC) Active Problems:   Alcohol dependence (HCC)   AKI (acute kidney injury) (HCC)   Hypokalemia   Jaundice   Prolonged QT interval   Alcohol withdrawal syndrome History of alcohol withdrawal seizures Alcohol dependence Uncomplicated course so far.  Appears resolved but has ongoing electrolytes derangements. Continue monitoring per CIWA with Ativan PRN. Decrease Librium 25 mg TID >> BID. Thiamine folic acid multivitamin. Phos level with AM labs.  Left mandibular abscess due to dental caries  No signs of systemic infection which currently could be masked by alcohol withdrawal. Continue clindamycin. Discharge on 7 to 14 days oral clindamycin per discussion with on-call dentist. Needs outpatient dentist or at jail for tooth extraction ASAP. Soft foods.  Acute kidney injury -POA with Cr 1.24, up from baseline 0.482 weeks ago.  Likely prerenal azotemia in the setting of dehydration.  Resolved with IV fluids. Monitor renal function.  Hyponatremia - suspect hypovolemic due to poor PO intake during withdrawal course.  Serum osmolality 278 (~normal). Declined further on fluids. --stop IV fluids --check urine Osm, Urine Na, TSH, cortisol in AM  Hypomagnesemia, hypokalemia - have been replacing K and Mg daily. Monitor and replace as needed. --4 g IV Mg again today --K oral 40x2 today  History of hepatic steatosis -due to alcohol abuse Transaminitis -most likely due to alcohol abuse; LFTs improving.  Monitor CMP.  Prolonged QT interval - QTc on admission 509 ms. Maintain K > 4 and Mg > 2. Minimize QT prolonging medications. Repeat EKG.  Monitor on telemetry.  Patient BMI: Body mass index is 20.98 kg/m.   DVT prophylaxis: enoxaparin (LOVENOX) injection 40 mg Start: 05/11/21  1600   Diet:  Diet Orders (From admission, onward)     Start     Ordered   05/13/21 1109  Diet Heart Room service appropriate? Yes with Assist; Fluid consistency: Thin  Diet effective now       Question Answer Comment  Room service appropriate? Yes with Assist   Fluid consistency: Thin      05/13/21 1108              Code Status: Full Code   Brief Narrative / Hospital Course to Date:   45 y.o. female with medical history significant for alcoholism, history of alcohol withdrawal seizure, depression, and anxiety, presented to the ED with tremors and visual hallucinations due to alcohol withdrawal syndrome.  Reportedly last drink 7/12.  Patient noted to have a large left mandibular abscess due to dental caries.  Treating with clindamycin (severe pcn allergy).   Subjective 05/14/21    Patient sitting up in bed eating breakfast -v ery slowly due to severe left mouth and jaw pain due to abscess.  No fevers or chills or otherwise feeling sick.  Say withdrawal symptoms much better, clearing up.    [facility] office at bedside confirms they take patients to dentist all the time from jail for tooth extractions, and they will get that done.   Disposition Plan & Communication   Status is: Inpatient  Remains inpatient appropriate because:Inpatient level of care appropriate due to severity of illness. Ongoing EtOH withdrawal and electrolyte abnormalities.   Dispo: The patient is from:  [facility]al facility  Anticipated d/c is to:  Correctional facility              Patient currently is not medically stable to d/c.   Difficult to place patient No    Consults, Procedures, Significant Events   Consultants:  None  Procedures:  None  Antimicrobials:  Anti-infectives (From admission, onward)    Start     Dose/Rate Route Frequency Ordered Stop   05/11/21 1930  clindamycin (CLEOCIN) IVPB 600 mg        600 mg 100 mL/hr over 30 Minutes Intravenous Every 8 hours  05/11/21 1906           Micro    Objective   Vitals:   05/13/21 2011 05/14/21 0003 05/14/21 0200 05/14/21 0420  BP: 101/73 111/80  117/79  Pulse: 69 63  71  Resp: 19 18  14   Temp: 98.9 F (37.2 C) 99.2 F (37.3 C)  98.5 F (36.9 C)  TempSrc: Oral Oral  Oral  SpO2: 100% 99%  99%  Weight:   62.6 kg   Height:        Intake/Output Summary (Last 24 hours) at 05/14/2021 1537 Last data filed at 05/14/2021 0817 Gross per 24 hour  Intake 1432.21 ml  Output 1850 ml  Net -417.79 ml   Filed Weights   05/12/21 0449 05/13/21 0420 05/14/21 0200  Weight: 60.4 kg 61.8 kg 62.6 kg    Physical Exam:  General exam: awake and alert, no acute distress, more talkative each day HEENT: left mandible swelling stable, possibly increased inferior to the mandible, hearing grossly normal  Respiratory system:  CTAB, normal respiratory effort, on room air. Cardiovascular system: normal S1/S2, RRR, no pedal edema.   Central nervous system: no gross focal neurologic deficits, normal speech, A&Ox3   Labs   Data Reviewed: I have personally reviewed following labs and imaging studies  CBC: Recent Labs  Lab 05/11/21 0332 05/12/21 0406 05/13/21 0359 05/14/21 0225  WBC 7.6 6.0 6.3 7.0  NEUTROABS 4.1  --   --   --   HGB 11.2* 10.6* 10.2* 11.0*  HCT 33.5* 31.8* 30.6* 32.7*  MCV 113.9* 113.2* 111.7* 113.5*  PLT 169 176 210 264   Basic Metabolic Panel: Recent Labs  Lab 05/11/21 0332 05/11/21 0352 05/12/21 0406 05/13/21 0359 05/14/21 0225  NA 136  --  133* 131* 128*  K 3.0*  --  3.4* 3.0* 4.0  CL 99  --  103 100 96*  CO2 19*  --  25 24 28   GLUCOSE 76  --  98 111* 118*  BUN 9  --  <5* <5* <5*  CREATININE 1.24*  --  0.56 0.47 0.54  CALCIUM 9.1  --  8.1* 7.8* 8.1*  MG  --  1.7 1.5* 1.6* 1.7  PHOS  --  4.0 3.1  --   --    GFR: Estimated Creatinine Clearance: 89.6 mL/min (by C-G formula based on SCr of 0.54 mg/dL). Liver Function Tests: Recent Labs  Lab 05/11/21 0332 05/12/21 0406  05/13/21 0359 05/14/21 0225  AST 117* 70* 63* 74*  ALT 41 29 28 30   ALKPHOS 158* 113 107 122  BILITOT 3.2* 1.2 1.3* 0.9  PROT 7.4 6.4* 6.0* 6.4*  ALBUMIN 3.1* 2.2* 2.2* 2.3*   No results for input(s): LIPASE, AMYLASE in the last 168 hours. Recent Labs  Lab 05/11/21 0740  AMMONIA 29   Coagulation Profile: Recent Labs  Lab 05/11/21 0740  INR 1.1   Cardiac Enzymes: No results  for input(s): CKTOTAL, CKMB, CKMBINDEX, TROPONINI in the last 168 hours. BNP (last 3 results) No results for input(s): PROBNP in the last 8760 hours. HbA1C: No results for input(s): HGBA1C in the last 72 hours. CBG: No results for input(s): GLUCAP in the last 168 hours. Lipid Profile: No results for input(s): CHOL, HDL, LDLCALC, TRIG, CHOLHDL, LDLDIRECT in the last 72 hours. Thyroid Function Tests: No results for input(s): TSH, T4TOTAL, FREET4, T3FREE, THYROIDAB in the last 72 hours. Anemia Panel: No results for input(s): VITAMINB12, FOLATE, FERRITIN, TIBC, IRON, RETICCTPCT in the last 72 hours. Sepsis Labs: No results for input(s): PROCALCITON, LATICACIDVEN in the last 168 hours.  Recent Results (from the past 240 hour(s))  Resp Panel by RT-PCR (Flu A&B, Covid) Nasopharyngeal Swab     Status: None   Collection Time: 05/11/21  4:50 AM   Specimen: Nasopharyngeal Swab; Nasopharyngeal(NP) swabs in vial transport medium  Result Value Ref Range Status   SARS Coronavirus 2 by RT PCR NEGATIVE NEGATIVE Final    Comment: (NOTE) SARS-CoV-2 target nucleic acids are NOT DETECTED.  The SARS-CoV-2 RNA is generally detectable in upper respiratory specimens during the acute phase of infection. The lowest concentration of SARS-CoV-2 viral copies this assay can detect is 138 copies/mL. A negative result does not preclude SARS-Cov-2 infection and should not be used as the sole basis for treatment or other patient management decisions. A negative result may occur with  improper specimen collection/handling,  submission of specimen other than nasopharyngeal swab, presence of viral mutation(s) within the areas targeted by this assay, and inadequate number of viral copies(<138 copies/mL). A negative result must be combined with clinical observations, patient history, and epidemiological information. The expected result is Negative.  Fact Sheet for Patients:  BloggerCourse.com  Fact Sheet for Healthcare Providers:  SeriousBroker.it  This test is no t yet approved or cleared by the [place] FDA and  has been authorized for detection and/or diagnosis of SARS-CoV-2 by FDA under an Emergency Use Authorization (EUA). This EUA will remain  in effect (meaning this test can be used) for the duration of the COVID-19 declaration under Section 564(b)(1) of the Act, 21 U.S.C.section 360bbb-3(b)(1), unless the authorization is terminated  or revoked sooner.       Influenza A by PCR NEGATIVE NEGATIVE Final   Influenza B by PCR NEGATIVE NEGATIVE Final    Comment: (NOTE) The Xpert Xpress SARS-CoV-2/FLU/RSV plus assay is intended as an aid in the diagnosis of influenza from Nasopharyngeal swab specimens and should not be used as a sole basis for treatment. Nasal washings and aspirates are unacceptable for Xpert Xpress SARS-CoV-2/FLU/RSV testing.  Fact Sheet for Patients: BloggerCourse.com  Fact Sheet for Healthcare Providers: SeriousBroker.it  This test is not yet approved or cleared by the [place] FDA and has been authorized for detection and/or diagnosis of SARS-CoV-2 by FDA under an Emergency Use Authorization (EUA). This EUA will remain in effect (meaning this test can be used) for the duration of the COVID-19 declaration under Section 564(b)(1) of the Act, 21 U.S.C. section 360bbb-3(b)(1), unless the authorization is terminated or revoked.  Performed at [facility], [address], [place], [place] [address]       Imaging Studies   No results found.   Medications   Scheduled Meds:  chlordiazePOXIDE  25 mg Oral BID   enoxaparin (LOVENOX) injection  40 mg Subcutaneous Q24H   folic acid  1 mg Oral Daily   multivitamin with minerals  1 tablet Oral  Daily   [START ON 05/15/2021] thiamine  100 mg Oral Daily   Continuous Infusions:  clindamycin (CLEOCIN) IV 600 mg (05/14/21 1101)       LOS: 3 days    Time spent: 30 minutes with > 50% spent at bedside and in coordination of care     Pennie BanterKelly A Charleston Hankin, DO [facility]  05/14/2021, 3:37 PM      If 7PM-7AM, please contact night-coverage. How to contact the [facility] Attending or Consulting provider 7A - 7P or covering provider during after hours 7P -7A, for this patient?    Check the care team in [facility] and look for a) attending/consulting [facility] provider listed and b) the [facility] team listed Log into [email or web address] and use [place]'s universal password to access. If you do not have the password, please contact the hospital operator. Locate the [facility] provider you are looking for under [facility] and page to a number that you can be directly reached. If you still have difficulty reaching the provider, please page the [facility] (Director on Call) for the Hospitalists listed on amion for assistance.

## 2021-05-14 NOTE — Plan of Care (Signed)
  Problem: Skin Integrity: Goal: Risk for impaired skin integrity will decrease Outcome: Completed/Met   Problem: Safety: Goal: Ability to remain free from injury will improve Outcome: Completed/Met   Problem: Elimination: Goal: Will not experience complications related to urinary retention Outcome: Completed/Met   Problem: Elimination: Goal: Will not experience complications related to bowel motility Outcome: Completed/Met

## 2021-05-14 NOTE — Progress Notes (Addendum)
Pharmacy Antibiotic Note  Miranda Wood is a 45 y.o. female admitted on 05/11/2021 with  oral abscess .  Pharmacy has been consulted for clindamycin dosing (patient noted w/ severe penicillin allergy). Plans noted to a total of 7-14 days of antibiotics   Plan: -Continue clindamycin 600mg  IV q8h -Consider change to po 600mg  po q8h and could plan a total of 10 days -Will sign off. Please contact pharmacy with any other needs.  Thank you , , PharmD Clinical Pharmacist **Pharmacist phone directory can now be found on [email or web address] ([email or web address]).  Listed under [facility].

## 2021-05-14 NOTE — Plan of Care (Signed)
  Problem: Nutrition: Goal: Adequate nutrition will be maintained Outcome: Completed/Met

## 2021-05-14 NOTE — Plan of Care (Signed)

## 2021-05-15 DIAGNOSIS — E871 Hypo-osmolality and hyponatremia: Secondary | ICD-10-CM

## 2021-05-15 LAB — COMPREHENSIVE METABOLIC PANEL
ALT: 29 U/L (ref 0–44)
AST: 69 U/L — ABNORMAL HIGH (ref 15–41)
Albumin: 2.2 g/dL — ABNORMAL LOW (ref 3.5–5.0)
Alkaline Phosphatase: 115 U/L (ref 38–126)
Anion gap: 5 (ref 5–15)
BUN: <5 mg/dL — ABNORMAL LOW (ref 6–20)
CO2: 29 mmol/L (ref 22–32)
Calcium: 8.5 mg/dL — ABNORMAL LOW (ref 8.9–10.3)
Chloride: 99 mmol/L (ref 98–111)
Creatinine, Ser: 0.73 mg/dL (ref 0.44–1.00)
GFR, Estimated: >60 mL/min (ref >60)
Glucose, Bld: 104 mg/dL — ABNORMAL HIGH (ref 70–99)
Potassium: 4.1 mmol/L (ref 3.5–5.1)
Sodium: 133 mmol/L — ABNORMAL LOW (ref 135–145)
Total Bilirubin: 0.9 mg/dL (ref 0.3–1.2)
Total Protein: 6.6 g/dL (ref 6.5–8.1)

## 2021-05-15 LAB — CBC
HCT: 33.1 % — ABNORMAL LOW (ref 36.0–46.0)
Hemoglobin: 10.9 g/dL — ABNORMAL LOW (ref 12.0–15.0)
MCH: 37.7 pg — ABNORMAL HIGH (ref 26.0–34.0)
MCHC: 32.9 g/dL (ref 30.0–36.0)
MCV: 114.5 fL — ABNORMAL HIGH (ref 80.0–100.0)
Platelets: 358 10*3/uL (ref 150–400)
RBC: 2.89 MIL/uL — ABNORMAL LOW (ref 3.87–5.11)
RDW: 12.5 % (ref 11.5–15.5)
WBC: 5.7 10*3/uL (ref 4.0–10.5)
nRBC: 0 % (ref 0.0–0.2)

## 2021-05-15 LAB — PHOSPHORUS: Phosphorus: 4.4 mg/dL (ref 2.5–4.6)

## 2021-05-15 LAB — TSH: TSH: 6.653 u[IU]/mL — ABNORMAL HIGH (ref 0.350–4.500)

## 2021-05-15 LAB — T4, FREE: Free T4: 1.03 ng/dL (ref 0.61–1.12)

## 2021-05-15 LAB — MAGNESIUM: Magnesium: 1.5 mg/dL — ABNORMAL LOW (ref 1.7–2.4)

## 2021-05-15 LAB — CORTISOL-AM, BLOOD: Cortisol - AM: 2.8 ug/dL — ABNORMAL LOW (ref 6.7–22.6)

## 2021-05-15 MED ORDER — FOLIC ACID 1 MG PO TABS: 1.0000 mg | ORAL_TABLET | Freq: Every day | ORAL | 2 refills | Status: DC

## 2021-05-15 MED ORDER — MAGNESIUM SULFATE 4 GM/100ML IV SOLN
4.0000 g | Freq: Once | INTRAVENOUS | Status: AC
  Administered 2021-05-15: 4 g via INTRAVENOUS
  Filled 2021-05-15: qty 100

## 2021-05-15 MED ORDER — THIAMINE HCL 100 MG PO TABS: 100.0000 mg | ORAL_TABLET | Freq: Every day | ORAL | 2 refills | Status: DC

## 2021-05-15 MED ORDER — ADULT MULTIVITAMIN W/MINERALS CH: 1.0000 | ORAL_TABLET | Freq: Every day | ORAL | Status: DC

## 2021-05-15 MED ORDER — MAGNESIUM 30 MG PO TABS: 30.0000 mg | ORAL_TABLET | Freq: Two times a day (BID) | ORAL | 0 refills | Status: DC

## 2021-05-15 MED ORDER — CLINDAMYCIN HCL 300 MG PO CAPS: 300.0000 mg | ORAL_CAPSULE | Freq: Three times a day (TID) | ORAL | 0 refills | Status: AC

## 2021-05-15 MED ORDER — CHLORDIAZEPOXIDE HCL 10 MG PO CAPS: ORAL_CAPSULE | ORAL | 0 refills | Status: AC

## 2021-05-15 MED ORDER — OXYCODONE HCL 10 MG PO TABS: 10.0000 mg | ORAL_TABLET | ORAL | 0 refills | Status: DC | PRN

## 2021-05-15 NOTE — TOC Transition Note (Signed)
Transition of Care [facility]) - CM/SW Discharge Note   Patient Details  Name: Miranda Wood MRN: [id number] Date of Birth: [birth year]  [facility]) CM/SW Contact:  Leone Haven, RN Phone Number: 05/15/2021, 12:54 PM   Clinical Narrative:    Patient is from [employer].  NCM spoke with Audrea Muscat at the [employer].  She states to fax over there dc summary to [phone].  NCM faxed dc summary.  Staff RN has the printed out scripts,  MD will need to come to sign scripts.     Final next level of care: [facility]rections Facility Barriers to Discharge: No Barriers Identified   Patient Goals and CMS Choice Patient states their goals for this hospitalization and ongoing recovery are:: return to corrections facility   Choice offered to / list presented to : NA  Discharge Placement                       Discharge Plan and Services In-house Referral: NA Discharge Planning Services: CM Consult Post Acute Care Choice: NA            DME Agency: NA       HH Arranged: NA          Social Determinants of Health (SDOH) Interventions     Readmission Risk Interventions Readmission Risk Prevention Plan 05/15/2021  Transportation Screening Complete  PCP or Specialist Appt within 3-5 Days (No Data)  HRI or Home Care Consult Complete  Social Work Consult for Recovery Care Planning/Counseling Complete  Palliative Care Screening Not Applicable  Medication Review Oceanographer) Complete  Some recent data might be hidden

## 2021-05-15 NOTE — Plan of Care (Signed)
  Problem: Education: Goal: Knowledge of General Education information will improve Description Including pain rating scale, medication(s)/side effects and non-pharmacologic comfort measures Outcome: Progressing   Problem: Health Behavior/Discharge Planning: Goal: Ability to manage health-related needs will improve Outcome: Progressing   Problem: Clinical Measurements: Goal: Ability to maintain clinical measurements within normal limits will improve Outcome: Progressing Goal: Will remain free from infection Outcome: Progressing Goal: Diagnostic test results will improve Outcome: Progressing Goal: Respiratory complications will improve Outcome: Progressing Goal: Cardiovascular complication will be avoided Outcome: Progressing   Problem: Activity: Goal: Risk for activity intolerance will decrease Outcome: Progressing   Problem: Coping: Goal: Level of anxiety will decrease Outcome: Progressing   Problem: Pain Managment: Goal: General experience of comfort will improve Outcome: Progressing   

## 2021-05-15 NOTE — TOC Initial Note (Signed)
Transition of Care [facility]) - Initial/Assessment Note    Patient Details  Name: Miranda Wood MRN: [id number] Date of Birth: 1976-01-24  Transition of Care [facility]) CM/SW Contact:    Leone Haven, RN Phone Number: 05/15/2021, 12:43 PM  Clinical Narrative:                 Patient is from [employer].  NCM spoke with Audrea Muscat at the [employer].  She states to fax over there dc summary to [phone].  NCM faxed dc summary.  Staff RN has the printed out scripts,  MD will need to come to sign scripts.  Expected Discharge Plan: [facility] Facility Barriers to Discharge: No Barriers Identified   Patient Goals and CMS Choice Patient states their goals for this hospitalization and ongoing recovery are:: return to corrections facility   Choice offered to / list presented to : NA  Expected Discharge Plan and Services Expected Discharge Plan: [facility] Facility In-house Referral: NA Discharge Planning Services: CM Consult Post Acute Care Choice: NA Living arrangements for the past 2 months:  (corrections facility) Expected Discharge Date: 05/15/21                 DME Agency: NA       HH Arranged: NA          Prior Living Arrangements/Services Living arrangements for the past 2 months:  (corrections facility) Lives with:: Other (Comment) Curator) Patient language and need for interpreter reviewed:: Yes Do you feel safe going back to the place where you live?: Yes      Need for Family Participation in Patient Care: No (Comment) Care giver support system in place?: No (comment)   Criminal Activity/Legal Involvement Pertinent to Current Situation/Hospitalization: No - Comment as needed  Activities of Daily Living      Permission Sought/Granted                  Emotional Assessment Appearance:: Appears stated age Attitude/Demeanor/Rapport:  (appropriate) Affect (typically observed):  Appropriate Orientation: : Oriented to Self, Oriented to Place, Oriented to  Time, Oriented to Situation Alcohol / Substance Use: Not Applicable Psych Involvement: No (comment)  Admission diagnosis:  Alcohol withdrawal (HCC) [F10.239] 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) [F10.232] Patient Active Problem List   Diagnosis Date Noted   Hyponatremia 05/14/2021   Alcohol withdrawal (HCC) 05/11/2021   AKI (acute kidney injury) (HCC) 05/11/2021   Hypokalemia 05/11/2021   Jaundice 05/11/2021   Prolonged QT interval 05/11/2021   Acute hyperactive alcohol withdrawal delirium (HCC) 12/31/2020   Hypomagnesemia 12/31/2020   Hyperbilirubinemia 12/31/2020   Infected sebaceous cyst of back 12/31/2020   UTI (urinary tract infection) 12/31/2020   Alcohol withdrawal seizure (HCC) 12/30/2020   Alcohol dependence (HCC) 12/30/2020   Nausea and vomiting 12/30/2020   Tobacco use 12/30/2020   Thrombocytopenia (HCC) 12/30/2020   Seizure (HCC) 12/30/2020   Alcohol dependence with withdrawal with complication (HCC) 04/29/2018   Major depressive disorder, 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   Substance induced mood disorder (HCC)    Substance-induced anxiety disorder (HCC)    Alcohol withdrawal without perceptual disturbances with complication [facility])    Cesarean delivery delivered 07/10/2011   PCP:  Lavinia Sharps, NP Pharmacy:  No Pharmacies Listed    Social Determinants of Health (SDOH) Interventions    Readmission Risk Interventions Readmission Risk Prevention Plan 05/15/2021  Transportation Screening Complete  PCP or Specialist Appt within 3-5 Days (No  Data)  HRI or Home Care Consult Complete  Social Work Consult for Recovery Care Planning/Counseling Complete  Palliative Care Screening Not Applicable  Medication Review Oceanographer) Complete  Some recent data might be hidden
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
## 2023-05-16 ENCOUNTER — Emergency Department (HOSPITAL_COMMUNITY)
Admission: EM | Admit: 2023-05-16 | Discharge: 2023-05-16 | Disposition: A | Discharge status: Home / Self Care | Payer: Medicaid Other | Attending: Emergency Medicine | Admitting: Emergency Medicine

## 2023-05-16 ENCOUNTER — Other Ambulatory Visit: Payer: Self-pay

## 2023-05-16 ENCOUNTER — Emergency Department (HOSPITAL_COMMUNITY): Payer: Medicaid Other

## 2023-05-16 DIAGNOSIS — S82832A Other fracture of upper and lower end of left fibula, initial encounter for closed fracture: Secondary | ICD-10-CM | POA: Diagnosis not present

## 2023-05-16 DIAGNOSIS — W228XXA Striking against or struck by other objects, initial encounter: Secondary | ICD-10-CM | POA: Insufficient documentation

## 2023-05-16 DIAGNOSIS — Y92148 Other place in prison as the place of occurrence of the external cause: Secondary | ICD-10-CM | POA: Insufficient documentation

## 2023-05-16 DIAGNOSIS — S82839A Other fracture of upper and lower end of unspecified fibula, initial encounter for closed fracture: Secondary | ICD-10-CM

## 2023-05-16 DIAGNOSIS — S6991XA Unspecified injury of right wrist, hand and finger(s), initial encounter: Secondary | ICD-10-CM | POA: Diagnosis present

## 2023-05-16 DIAGNOSIS — S62356A Nondisplaced fracture of shaft of fifth metacarpal bone, right hand, initial encounter for closed fracture: Secondary | ICD-10-CM | POA: Insufficient documentation
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
## 2023-05-16 NOTE — Discharge Instructions (Signed)
You were seen in the emergency department today for fractures of your hand and ankle.  We placed you in a boot to support the fracture of your fibula.  I spoke with orthopedics while you are here.  You are to have follow-up with Dr. Kerry Fort, who is a hand surgeon regarding your fracture in your hand.  Given that this is an old fracture and you are not having significant pain and able to move the hand well we have not placed you in a brace for this.  For your ankle, we are having you follow-up with Dr. Blanchie Dessert in the outpatient office for this.  I have sent referrals to both of them.  If you do not hear back in about 72 business hours please call both of them to schedule follow-up appointment.  Please return to emergency department for any worsening symptoms.  You may use ibuprofen or acetaminophen for pain control.

## 2024-01-13 ENCOUNTER — Encounter (HOSPITAL_COMMUNITY): Payer: Self-pay

## 2024-01-13 ENCOUNTER — Observation Stay (HOSPITAL_COMMUNITY): Payer: Self-pay

## 2024-01-13 ENCOUNTER — Observation Stay (HOSPITAL_COMMUNITY)
Admission: EM | Admit: 2024-01-13 | Discharge: 2024-01-14 | DRG: 894 | Discharge status: Against Medical Advice | Payer: Self-pay | Attending: Student in an Organized Health Care Education/Training Program | Admitting: Student in an Organized Health Care Education/Training Program

## 2024-01-13 ENCOUNTER — Other Ambulatory Visit: Payer: Self-pay

## 2024-01-13 DIAGNOSIS — D696 Thrombocytopenia, unspecified: Secondary | ICD-10-CM | POA: Diagnosis not present

## 2024-01-13 DIAGNOSIS — Y900 Blood alcohol level of less than 20 mg/100 ml: Secondary | ICD-10-CM | POA: Diagnosis present

## 2024-01-13 DIAGNOSIS — F419 Anxiety disorder, unspecified: Secondary | ICD-10-CM | POA: Diagnosis present

## 2024-01-13 DIAGNOSIS — Z79899 Other long term (current) drug therapy: Secondary | ICD-10-CM | POA: Diagnosis not present

## 2024-01-13 DIAGNOSIS — R443 Hallucinations, unspecified: Secondary | ICD-10-CM | POA: Diagnosis not present

## 2024-01-13 DIAGNOSIS — Z880 Allergy status to penicillin: Secondary | ICD-10-CM

## 2024-01-13 DIAGNOSIS — Z888 Allergy status to other drugs, medicaments and biological substances status: Secondary | ICD-10-CM | POA: Diagnosis not present

## 2024-01-13 DIAGNOSIS — G629 Polyneuropathy, unspecified: Secondary | ICD-10-CM | POA: Diagnosis not present

## 2024-01-13 DIAGNOSIS — F17210 Nicotine dependence, cigarettes, uncomplicated: Secondary | ICD-10-CM | POA: Diagnosis not present

## 2024-01-13 DIAGNOSIS — Z6824 Body mass index (BMI) 24.0-24.9, adult: Secondary | ICD-10-CM | POA: Diagnosis not present

## 2024-01-13 DIAGNOSIS — R569 Unspecified convulsions: Secondary | ICD-10-CM

## 2024-01-13 DIAGNOSIS — Z1152 Encounter for screening for COVID-19: Secondary | ICD-10-CM

## 2024-01-13 DIAGNOSIS — K769 Liver disease, unspecified: Secondary | ICD-10-CM | POA: Diagnosis present

## 2024-01-13 DIAGNOSIS — Z5900 Homelessness unspecified: Secondary | ICD-10-CM | POA: Diagnosis not present

## 2024-01-13 DIAGNOSIS — R112 Nausea with vomiting, unspecified: Secondary | ICD-10-CM

## 2024-01-13 DIAGNOSIS — F10239 Alcohol dependence with withdrawal, unspecified: Secondary | ICD-10-CM | POA: Diagnosis not present

## 2024-01-13 DIAGNOSIS — F10231 Alcohol dependence with withdrawal delirium: Secondary | ICD-10-CM | POA: Diagnosis not present

## 2024-01-13 DIAGNOSIS — E43 Unspecified severe protein-calorie malnutrition: Secondary | ICD-10-CM | POA: Diagnosis present

## 2024-01-13 DIAGNOSIS — F32A Depression, unspecified: Secondary | ICD-10-CM | POA: Diagnosis not present

## 2024-01-13 DIAGNOSIS — F10939 Alcohol use, unspecified with withdrawal, unspecified: Secondary | ICD-10-CM | POA: Diagnosis present

## 2024-01-13 DIAGNOSIS — F1020 Alcohol dependence, uncomplicated: Principal | ICD-10-CM

## 2024-01-13 DIAGNOSIS — K2920 Alcoholic gastritis without bleeding: Secondary | ICD-10-CM | POA: Diagnosis present

## 2024-01-13 DIAGNOSIS — R8281 Pyuria: Secondary | ICD-10-CM | POA: Diagnosis not present

## 2024-01-13 LAB — CBC WITH DIFFERENTIAL/PLATELET
Abs Immature Granulocytes: 0.02 10*3/uL (ref 0.00–0.07)
Basophils Absolute: 0 10*3/uL (ref 0.0–0.1)
Basophils Relative: 1 %
Eosinophils Absolute: 0 10*3/uL (ref 0.0–0.5)
Eosinophils Relative: 1 %
HCT: 39.6 % (ref 36.0–46.0)
Hemoglobin: 13.7 g/dL (ref 12.0–15.0)
Immature Granulocytes: 1 %
Lymphocytes Relative: 27 %
Lymphs Abs: 0.8 10*3/uL (ref 0.7–4.0)
MCH: 34.2 pg — ABNORMAL HIGH (ref 26.0–34.0)
MCHC: 34.6 g/dL (ref 30.0–36.0)
MCV: 98.8 fL (ref 80.0–100.0)
Monocytes Absolute: 0.3 10*3/uL (ref 0.1–1.0)
Monocytes Relative: 10 %
Neutro Abs: 1.7 10*3/uL (ref 1.7–7.7)
Neutrophils Relative %: 60 %
Platelets: 79 10*3/uL — ABNORMAL LOW (ref 150–400)
RBC: 4.01 MIL/uL (ref 3.87–5.11)
RDW: 14.2 % (ref 11.5–15.5)
WBC: 2.8 10*3/uL — ABNORMAL LOW (ref 4.0–10.5)
nRBC: 0 % (ref 0.0–0.2)

## 2024-01-13 LAB — COMPREHENSIVE METABOLIC PANEL
ALT: 54 U/L — ABNORMAL HIGH (ref 0–44)
AST: 143 U/L — ABNORMAL HIGH (ref 15–41)
Albumin: 4 g/dL (ref 3.5–5.0)
Alkaline Phosphatase: 65 U/L (ref 38–126)
Anion gap: 14 (ref 5–15)
BUN: 7 mg/dL (ref 6–20)
CO2: 23 mmol/L (ref 22–32)
Calcium: 9.1 mg/dL (ref 8.9–10.3)
Chloride: 98 mmol/L (ref 98–111)
Creatinine, Ser: 0.47 mg/dL (ref 0.44–1.00)
GFR, Estimated: >60 mL/min (ref >60)
Glucose, Bld: 125 mg/dL — ABNORMAL HIGH (ref 70–99)
Potassium: 3.5 mmol/L (ref 3.5–5.1)
Sodium: 135 mmol/L (ref 135–145)
Total Bilirubin: 1.3 mg/dL — ABNORMAL HIGH (ref 0.0–1.2)
Total Protein: 7.5 g/dL (ref 6.5–8.1)

## 2024-01-13 LAB — RESP PANEL BY RT-PCR (RSV, FLU A&B, COVID)  RVPGX2
Influenza A by PCR: NEGATIVE
Influenza B by PCR: NEGATIVE
Resp Syncytial Virus by PCR: NEGATIVE
SARS Coronavirus 2 by RT PCR: NEGATIVE

## 2024-01-13 LAB — LIPASE, BLOOD: Lipase: 37 U/L (ref 11–51)

## 2024-01-13 LAB — HCG, SERUM, QUALITATIVE: Preg, Serum: NEGATIVE

## 2024-01-13 LAB — ETHANOL: Alcohol, Ethyl (B): <10 mg/dL (ref <10)

## 2024-01-13 LAB — MAGNESIUM: Magnesium: 1.3 mg/dL — ABNORMAL LOW (ref 1.7–2.4)

## 2024-01-13 MED ORDER — ACETAMINOPHEN 650 MG RE SUPP: 650.0000 mg | Freq: Four times a day (QID) | RECTAL | Status: DC | PRN

## 2024-01-13 MED ORDER — THIAMINE HCL 100 MG/ML IJ SOLN
100.0000 mg | Freq: Every day | INTRAMUSCULAR | Status: DC
  Administered 2024-01-13: 100 mg via INTRAVENOUS
  Filled 2024-01-13: qty 2

## 2024-01-13 MED ORDER — LORAZEPAM 2 MG/ML IJ SOLN: 0.0000 mg | Freq: Two times a day (BID) | INTRAMUSCULAR | Status: DC

## 2024-01-13 MED ORDER — HYDROCODONE-ACETAMINOPHEN 5-325 MG PO TABS: 1.0000 | ORAL_TABLET | ORAL | Status: DC | PRN

## 2024-01-13 MED ORDER — LORAZEPAM 2 MG/ML IJ SOLN
0.0000 mg | Freq: Four times a day (QID) | INTRAMUSCULAR | Status: DC
  Administered 2024-01-13: 2 mg via INTRAVENOUS
  Filled 2024-01-13: qty 1

## 2024-01-13 MED ORDER — LORAZEPAM 1 MG PO TABS: 0.0000 mg | ORAL_TABLET | Freq: Two times a day (BID) | ORAL | Status: DC

## 2024-01-13 MED ORDER — MAGNESIUM SULFATE 2 GM/50ML IV SOLN
2.0000 g | Freq: Once | INTRAVENOUS | Status: AC
  Administered 2024-01-13: 2 g via INTRAVENOUS
  Filled 2024-01-13: qty 50

## 2024-01-13 MED ORDER — ACETAMINOPHEN 325 MG PO TABS: 650.0000 mg | ORAL_TABLET | Freq: Four times a day (QID) | ORAL | Status: DC | PRN

## 2024-01-13 MED ORDER — SODIUM CHLORIDE 0.9 % IV SOLN: INTRAVENOUS | Status: DC

## 2024-01-13 MED ORDER — LORAZEPAM 2 MG/ML IJ SOLN
1.0000 mg | INTRAMUSCULAR | Status: DC | PRN
  Administered 2024-01-14 (×2): 2 mg via INTRAVENOUS
  Administered 2024-01-14: 1 mg via INTRAVENOUS
  Administered 2024-01-14: 4 mg via INTRAVENOUS
  Filled 2024-01-13 (×2): qty 1
  Filled 2024-01-13: qty 2
  Filled 2024-01-13: qty 1

## 2024-01-13 MED ORDER — METOCLOPRAMIDE HCL 5 MG/ML IJ SOLN: 5.0000 mg | Freq: Four times a day (QID) | INTRAMUSCULAR | Status: DC | PRN

## 2024-01-13 MED ORDER — ONDANSETRON HCL 4 MG/2ML IJ SOLN
4.0000 mg | Freq: Once | INTRAMUSCULAR | Status: AC
  Administered 2024-01-13: 4 mg via INTRAVENOUS
  Filled 2024-01-13: qty 2

## 2024-01-13 MED ORDER — THIAMINE MONONITRATE 100 MG PO TABS
100.0000 mg | ORAL_TABLET | Freq: Every day | ORAL | Status: DC
  Administered 2024-01-14: 100 mg via ORAL
  Filled 2024-01-13: qty 1

## 2024-01-13 MED ORDER — ADULT MULTIVITAMIN W/MINERALS CH
1.0000 | ORAL_TABLET | Freq: Every day | ORAL | Status: DC
  Administered 2024-01-13 – 2024-01-14 (×2): 1 via ORAL
  Filled 2024-01-13 (×2): qty 1

## 2024-01-13 MED ORDER — LORAZEPAM 1 MG PO TABS: 1.0000 mg | ORAL_TABLET | ORAL | Status: DC | PRN

## 2024-01-13 MED ORDER — SODIUM CHLORIDE 0.9 % IV BOLUS
1000.0000 mL | Freq: Once | INTRAVENOUS | Status: AC
  Administered 2024-01-13: 1000 mL via INTRAVENOUS

## 2024-01-13 MED ORDER — LORAZEPAM 2 MG/ML IJ SOLN
2.0000 mg | Freq: Once | INTRAMUSCULAR | Status: AC
  Administered 2024-01-13: 2 mg via INTRAVENOUS
  Filled 2024-01-13: qty 1

## 2024-01-13 MED ORDER — FAMOTIDINE IN NACL 20-0.9 MG/50ML-% IV SOLN
20.0000 mg | Freq: Once | INTRAVENOUS | Status: AC
Start: 2024-01-13 — End: 2024-01-13
  Administered 2024-01-13: 20 mg via INTRAVENOUS
  Filled 2024-01-13: qty 50

## 2024-01-13 MED ORDER — LORAZEPAM 1 MG PO TABS
0.0000 mg | ORAL_TABLET | Freq: Four times a day (QID) | ORAL | Status: DC
  Administered 2024-01-13: 4 mg via ORAL
  Administered 2024-01-14: 1 mg via ORAL
  Filled 2024-01-13: qty 2
  Filled 2024-01-13: qty 4

## 2024-01-13 MED ORDER — FOLIC ACID 1 MG PO TABS
1.0000 mg | ORAL_TABLET | Freq: Every day | ORAL | Status: DC
  Administered 2024-01-13 – 2024-01-14 (×2): 1 mg via ORAL
  Filled 2024-01-13 (×2): qty 1

## 2024-01-13 NOTE — Assessment & Plan Note (Signed)
-   will replace electrolytes and repeat  check Mg, phos and Ca level and replace as needed Monitor on telemetry   Lab Results  Component Value Date   K 3.5 01/13/2024     Lab Results  Component Value Date   CREATININE 0.47 01/13/2024   Lab Results  Component Value Date   MG 1.3 (L) 01/13/2024   Lab Results  Component Value Date   CALCIUM 9.1 01/13/2024   PHOS 4.4 05/15/2021

## 2024-01-13 NOTE — ED Notes (Signed)
 Patient currently sleeping with no signs of acute distress noted. Will continue to monitor CIWA.

## 2024-01-13 NOTE — H&P (Signed)
 [name]:[id number] DOB: [birth year] DOA: 01/13/2024     PCP: Lavinia Sharps, NP   Outpatient Specialists:   Patient arrived to ER on 01/13/24 at 1400 Referred by Attending Therisa Doyne, MD   Patient coming from: Homeless     Chief Complaint:   Chief Complaint  Patient presents with   Tremors   Emesis    HPI: Miranda Wood is a 48 y.o. female with medical history significant of ETOH abuse    Presented with   Persistent nausea and vomiting patient attributes that to alcohol Hx of severe etOH abuse, has been having a bit of nausea and vomiting for the past 2 wks unable to keep Etoh down, needs a fith of liquor to avoid Dt's CIWA was at 17 on arrival  No abd pain    Patient states that she started to have DT symptoms and some hallucinations at which point she knew she needed to go to emergency department Denies any abdominal discomfort Reports she has been somewhat more unstable lately has difficulty getting up onto the curb so feet burning She has paresthesias  Reports headache that has been getting worse Feels occasional vertigo  Reports she has been compliant with thiamine No confusion  Patient states that this morning she had an episode of seizure while at a bus witnessed by other riders states she has his history of seizures in the past all associated with alcohol abuse    significant ETOH intake     Lab Results  Component Value Date   SARSCOV2NAA NEGATIVE 01/13/2024   SARSCOV2NAA NEGATIVE 05/11/2021   SARSCOV2NAA NEGATIVE 12/30/2020        Regarding pertinent Chronic problems:        Liver disease   Hepatic Function Panel     Component Value Date/Time   PROT 7.5 01/13/2024 1432   ALBUMIN 4.0 01/13/2024 1432   AST 143 (H) 01/13/2024 1432   ALT 54 (H) 01/13/2024 1432   ALKPHOS 65 01/13/2024 1432   BILITOT 1.3 (H) 01/13/2024 1432    INR pending     While in ER: Clinical Course as of 01/13/24 2048  Mon Jan 13, 2024  1555 WBC(!): 2.8 [AH]  1555 Platelets(!): 79 [AH]  1655 Ciwa down from 17-11 [AH]    Clinical Course User Index [AH] Arthor Captain, PA-C     Lab Orders         Resp panel by RT-PCR (RSV, Flu A&B, Covid) Anterior Nasal Swab         CBC with Differential         Comprehensive metabolic panel         Lipase, blood         Urinalysis, Routine w reflex microscopic -Urine, Clean Catch         Ethanol         Rapid urine drug screen (hospital performed)         hCG, serum, qualitative         Magnesium         Protime-INR      CT HEAD ordered    KUB - ordered  Following Medications were ordered in ER: Medications  LORazepam (ATIVAN) injection 0-4 mg ( Intravenous See Alternative 01/13/24 2028)    Or  LORazepam (ATIVAN) tablet 0-4 mg (4 mg Oral Given 01/13/24 2028)  LORazepam (ATIVAN) injection 0-4 mg (has no administration in time range)    Or  LORazepam (ATIVAN) tablet 0-4  mg (has no administration in time range)  thiamine (VITAMIN B1) tablet 100 mg ( Oral See Alternative 01/13/24 1443)    Or  thiamine (VITAMIN B1) injection 100 mg (100 mg Intravenous Given 01/13/24 1443)  ondansetron (ZOFRAN) injection 4 mg (4 mg Intravenous Given 01/13/24 1443)  LORazepam (ATIVAN) injection 2 mg (2 mg Intravenous Given 01/13/24 1443)  famotidine (PEPCID) IVPB 20 mg premix (0 mg Intravenous Stopped 01/13/24 1514)  sodium chloride 0.9 % bolus 1,000 mL (0 mLs Intravenous Stopped 01/13/24 1550)     ED Triage Vitals  Encounter Vitals Group     BP 01/13/24 1411 129/88     Systolic BP Percentile --      Diastolic BP Percentile --      Pulse Rate 01/13/24 1411 86     Resp 01/13/24 1411 20     Temp 01/13/24 1411 98.2 F (36.8 C)     Temp Source 01/13/24 1411 Oral     SpO2 01/13/24 1411 100 %     Weight 01/13/24 1409 160 lb 0.9 oz (72.6 kg)     Height 01/13/24 1409 5\' 8"  (1.727 m)     Head Circumference --      Peak Flow --      Pain Score 01/13/24 1409 0     Pain Loc --      Pain  Education --      Exclude from Growth Chart --   ZOXW(96)@     _________________________________________ Significant initial  Findings: Abnormal Labs Reviewed  CBC WITH DIFFERENTIAL/PLATELET - Abnormal; Notable for the following components:      Result Value   WBC 2.8 (*)    MCH 34.2 (*)    Platelets 79 (*)    All other components within normal limits  COMPREHENSIVE METABOLIC PANEL - Abnormal; Notable for the following components:   Glucose, Bld 125 (*)    AST 143 (*)    ALT 54 (*)    Total Bilirubin 1.3 (*)    All other components within normal limits  MAGNESIUM - Abnormal; Notable for the following components:   Magnesium 1.3 (*)    All other components within normal limits      ECG: Ordered Personally reviewed and interpreted by me showing: HR : 77 Rhythm: Sinus rhythm Anteroseptal infarct, age indeterminate Baseline wander in lead(s) III aVF QTC 476    The recent clinical data is shown below. Vitals:   01/13/24 1800 01/13/24 1828 01/13/24 1930 01/13/24 2030  BP: (!) 117/95   123/88  Pulse: 85  86 84  Resp: (!) 21  20   Temp:  98.7 F (37.1 C)  98.1 F (36.7 C)  TempSrc:  Oral  Oral  SpO2: 91%  95% 97%  Weight:      Height:        WBC     Component Value Date/Time   WBC 2.8 (L) 01/13/2024 1432   LYMPHSABS 0.8 01/13/2024 1432   MONOABS 0.3 01/13/2024 1432   EOSABS 0.0 01/13/2024 1432   BASOSABS 0.0 01/13/2024 1432      Results for orders placed or performed during the hospital encounter of 01/13/24  Resp panel by RT-PCR (RSV, Flu A&B, Covid) Anterior Nasal Swab     Status: None   Collection Time: 01/13/24  2:51 PM   Specimen: Anterior Nasal Swab  Result Value Ref Range Status   SARS Coronavirus 2 by RT PCR NEGATIVE NEGATIVE Final         Influenza A  by PCR NEGATIVE NEGATIVE Final   Influenza B by PCR NEGATIVE NEGATIVE Final         Resp Syncytial Virus by PCR NEGATIVE NEGATIVE Final          _____________________________________________________ Recent Labs  Lab 01/13/24 1432  NA 135  K 3.5  CO2 23  GLUCOSE 125*  BUN 7  CREATININE 0.47  CALCIUM 9.1  MG 1.3*    Cr   stable,    Lab Results  Component Value Date   CREATININE 0.47 01/13/2024   CREATININE 0.73 05/15/2021   CREATININE 0.54 05/14/2021    Recent Labs  Lab 01/13/24 1432  AST 143*  ALT 54*  ALKPHOS 65  BILITOT 1.3*  PROT 7.5  ALBUMIN 4.0   Lab Results  Component Value Date   CALCIUM 9.1 01/13/2024   PHOS 4.4 05/15/2021        Plt: Lab Results  Component Value Date   PLT 79 (L) 01/13/2024       Recent Labs  Lab 01/13/24 1432  WBC 2.8*  NEUTROABS 1.7  HGB 13.7  HCT 39.6  MCV 98.8  PLT 79*    HG/HCT  stable     Component Value Date/Time   HGB 13.7 01/13/2024 1432   HCT 39.6 01/13/2024 1432   MCV 98.8 01/13/2024 1432     Recent Labs  Lab 01/13/24 1432  LIPASE 37      _______________________________________________ Hospitalist was called for admission for   Severe alcohol dependence persistent intractable nausea vomiting, alcohol withdrawal delirium, thrombocytopenia    The following Work up has been ordered so far:  Orders Placed This Encounter  Procedures   Resp panel by RT-PCR (RSV, Flu A&B, Covid) Anterior Nasal Swab   CBC with Differential   Comprehensive metabolic panel   Lipase, blood   Urinalysis, Routine w reflex microscopic -Urine, Clean Catch   Ethanol   Rapid urine drug screen (hospital performed)   hCG, serum, qualitative   Magnesium   Protime-INR   ED Cardiac monitoring   Clinical institute withdrawal assessment every 6 hours X 48 hours, then every 12 hours x 48 hours   Notify MD if CIWA-AR is greater than 10 for 2 consecutive assessments.   Notify MD if CIWA-AR score > 10 point increase (consider critical care consult).   Notify MD if patient has a seizure.   May administer Ativan PO versus IV if patient is tolerating PO intake well   Vital  signs every 6 hours X 48 hours, then per unit protocol   Refer to Sidebar Report CIWA protocol   If Ativan given, reassess Clinical Institute Withdrawal Assessment (CIWA) with blood pressure and pulse rate within 1 hour of Ativan administration   Fluid/PO Challenge   Cardiac Monitoring - Continuous Indefinite   Consult to hospitalist   ED EKG   EKG 12-Lead   Insert peripheral IV   Place in observation (patient's expected length of stay will be less than 2 midnights)     OTHER Significant initial  Findings:  labs showing:     DM  labs:  HbA1C: No results for input(s): "HGBA1C" in the last 8760 hours.     CBG (last 3)  No results for input(s): "GLUCAP" in the last 72 hours.        Cultures:    Component Value Date/Time   SDES URINE, RANDOM 12/31/2020 0237   SPECREQUEST  12/31/2020 0237    NONE Performed at [facility], [address], [place], [place] [address]    CULT >=100,000 COLONIES/mL ESCHERICHIA COLI (A) 12/31/2020 0237   REPTSTATUS 01/02/2021 FINAL 12/31/2020 0237     Radiological Exams on Admission: No results found. _______________________________________________________________________________________________________ Latest  Blood pressure 123/88, pulse 84, temperature 98.1 F (36.7 C), temperature source Oral, resp. rate 20, height 5\' 8"  (1.727 m), weight 72.6 kg, SpO2 97%.   Vitals  labs and radiology finding personally reviewed  Review of Systems:    Pertinent positives include:   nausea, vomiting,  Constitutional:  No weight loss, night sweats, Fevers, chills, fatigue, weight loss  HEENT:  No headaches, Difficulty swallowing,Tooth/dental problems,Sore throat,  No sneezing, itching, ear ache, nasal congestion, post nasal drip,  Cardio-vascular:  No chest pain, Orthopnea, PND, anasarca, dizziness, palpitations.no Bilateral lower extremity swelling  GI:  No heartburn, indigestion, abdominal pain, diarrhea, change in bowel habits, loss of  appetite, melena, blood in stool, hematemesis Resp:  no shortness of breath at rest. No dyspnea on exertion, No excess mucus, no productive cough, No non-productive cough, No coughing up of blood.No change in color of mucus.No wheezing. Skin:  no rash or lesions. No jaundice GU:  no dysuria, change in color of urine, no urgency or frequency. No straining to urinate.  No flank pain.  Musculoskeletal:  No joint pain or no joint swelling. No decreased range of motion. No back pain.  Psych:  No change in mood or affect. No depression or anxiety. No memory loss.  Neuro: no localizing neurological complaints, no tingling, no weakness, no double vision, no gait abnormality, no slurred speech, no confusion  All systems reviewed and apart from HOPI all are negative _______________________________________________________________________________________________ Past Medical History:   Past Medical History:  Diagnosis Date   Anxiety    Blood transfusion without reported diagnosis    Complication of anesthesia    HR and B/P were elevated with epidural   Seizures (HCC)     Past Surgical History:  Procedure Laterality Date   CESAREAN SECTION     LEG SURGERY     LIPOMA EXCISION  2011   TUBAL LIGATION Bilateral 2012   vaginal del x3      Social History:  Ambulatory   independently      reports that she has been smoking cigarettes. She has never used smokeless tobacco. She reports current alcohol use. She reports that she does not use drugs.   Family History:   History reviewed. No pertinent family history. ______________________________________________________________________________________________ Allergies: Allergies  Allergen Reactions   Penicillins Shortness Of Breath    Has patient had a PCN reaction causing immediate rash, facial/tongue/throat swelling, SOB or lightheadedness with hypotension: Yes Has patient had a PCN reaction causing severe rash involving mucus membranes or  skin necrosis: No Has patient had a PCN reaction that required hospitalization: No Has patient had a PCN reaction occurring within the last 10 years: No If all of the above answers are "NO", then may proceed with Cephalosporin use.    Benadryl [Diphenhydramine] Hives    Hives per patient     Prior to Admission medications   Medication Sig Start Date End Date Taking? Authorizing Provider  folic acid (FOLVITE) 1 MG tablet Take 1 tablet (1 mg total) by mouth daily. 05/16/21   Esaw Grandchild A, DO  magnesium 30 MG tablet Take 1 tablet (30 mg total) by mouth 2 (two) times daily. 05/15/21   Pennie Banter, DO  Multiple Vitamin (MULTIVITAMIN WITH MINERALS) TABS tablet Take 1 tablet by mouth daily. 05/16/21  Esaw Grandchild A, DO  oxyCODONE 10 MG TABS Take 1 tablet (10 mg total) by mouth every 4 (four) hours as needed for severe pain. 05/15/21   Esaw Grandchild A, DO  thiamine 100 MG tablet Take 1 tablet (100 mg total) by mouth daily. 05/16/21   Pennie Banter, DO    ___________________________________________________________________________________________________ Physical Exam:    01/13/2024    8:30 PM 01/13/2024    7:30 PM 01/13/2024    6:00 PM  Vitals with BMI  Systolic 123  117  Diastolic 88  95  Pulse 84 86 85     1. General:  in No  Acute distress   Chronically ill  -appearing 2. Psychological: Alert and   Oriented 3. Head/ENT:   Dry Mucous Membranes                          Head Non traumatic, neck supple                      Poor Dentition 4. SKIN:  decreased Skin turgor,  Skin clean Dry and intact no rash    5. Heart: Regular rate and rhythm no  Murmur, no Rub or gallop 6. Lungs:  , no wheezes or crackles   7. Abdomen: Soft, epigastric -tender, Non distended  bowel sounds present 8. Lower extremities: no clubbing, cyanosis, no  edema 9. Neurologically   strength 5 out of 5 in all 4 extremities cranial nerves II through XII intact Tremulous 10. MSK: Normal range of  motion    Chart has been reviewed  ______________________________________________________________________________________________    Assessment/Plan   48 y.o. female with medical history significant of ETOH abuse   Admitted for Severe alcohol dependence persistent intractable nausea vomiting, alcohol withdrawal delirium, thrombocytopenia alcohol withdrawal seizure   Present on Admission:  Alcohol withdrawal (HCC)  Alcohol dependence with withdrawal with complication (HCC)  Hypomagnesemia  Nausea and vomiting  Thrombocytopenia (HCC)     Alcohol dependence with withdrawal with complication (HCC) Order CIWA protocol Transition care consult  Encourage EtOH cessation  Hypomagnesemia - will replace electrolytes and repeat  check Mg, phos and Ca level and replace as needed Monitor on telemetry   Lab Results  Component Value Date   K 3.5 01/13/2024     Lab Results  Component Value Date   CREATININE 0.47 01/13/2024   Lab Results  Component Value Date   MG 1.3 (L) 01/13/2024   Lab Results  Component Value Date   CALCIUM 9.1 01/13/2024   PHOS 4.4 05/15/2021     Nausea and vomiting Persistent Right base within normal limits Patient endorses that she feels like this is secondary to her alcohol abuse. Abdomen nontender Supportive management and continue to monitor Reglan as needed Rehydrate  Seizure due to alcohol withdrawal [facility]) Patient reports that she may have a seizure this morning while riding on the bus she says it would be witnessed and people told her that she was shaking.  She have had seizures in the past associated with alcohol withdrawals. Order seizure precautions. 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  DVT prophylaxis:  SCD      Code Status:    Code Status: Prior FULL CODE  as per patient    I had personally discussed CODE STATUS with patient   ACP   none     Family Communication:   Family not at  Bedside    Diet clear   Disposition Plan:   To home once workup is complete and patient is stable   Following barriers for discharge:                                                      Electrolytes corrected                                                           Will need to be able to tolerate PO                                Consult Orders  (From admission, onward)           Start     Ordered   01/13/24 2040  Consult to hospitalist  Once       Provider:  (Not yet assigned)  Question Answer Comment  Place call to: [facility]   Reason for Consult Admit      01/13/24 2039                              Transition of care consulted                    Consults called: none   Admission status:  ED Disposition     ED Disposition  Admit   Condition  --   Comment  Hospital Area: [facility] [[id number]]  Level of Care: Progressive [102]  Admit to Progressive based on following criteria: NEUROLOGICAL AND NEUROSURGICAL complex patients with significant risk of instability, who do not meet ICU criteria, yet require close observation or frequent assessment (< / = every 2 - 4 hours) with medical / nursing intervention.  May place patient in observation at [facility] or [facility] if equivalent level of care is available:: No  Covid Evaluation: Asymptomatic - no recent exposure (last 10 days) testing not required  Diagnosis: Alcohol withdrawal (HCC) [291.81.ICD-9-CM]  Admitting Physician: Therisa Doyne [[id number]]  Attending Physician: Therisa Doyne [[id number]]           Obs     Level of care        progressive    Lab Results  Component Value Date   SARSCOV2NAA NEGATIVE 01/13/2024    Aalina Brege 01/13/2024, 8:54 PM    [facility]     after 2 AM please page floor coverage PA If 7AM-7PM, please contact the day team taking care of the patient using [email or web address]

## 2024-01-13 NOTE — Assessment & Plan Note (Signed)
 In the setting of ongoing alcohol abuse

## 2024-01-13 NOTE — Assessment & Plan Note (Signed)
 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

## 2024-01-13 NOTE — ED Provider Notes (Signed)
 [place] EMERGENCY DEPARTMENT AT [facility] Provider Note   CSN: [id number] Arrival date & time: 01/13/24  1400     History  Chief Complaint  Patient presents with   Tremors   Emesis    Miranda Wood is a 48 y.o. female with a history of severe physiologic alcohol dependence and homelessness who presents to the emergency department for vomiting and withdrawal.  Patient states that about 2 weeks ago she began having intermittent episodes of vomiting.  Because she has been vomiting which she attributes to her alcohol use she has been unable to hold down her usual amount of alcohol.  She drinks at least 1/5 of liquor daily and has a history of delirium tremens and seizure when unable to ingest her alcohol.  She was able to get a little alcohol and yesterday that seemed to decrease her symptoms however she began vomiting again and since that time has been unable to hold down any alcohol.  She complains of tremors, restlessness, anxiety, vomiting, sensation of "bugs crawling" on her skin.  She denies visual or auditory hallucinations.   Emesis      Home Medications Prior to Admission medications   Medication Sig Start Date End Date Taking? Authorizing Provider  folic acid (FOLVITE) 1 MG tablet Take 1 tablet (1 mg total) by mouth daily. 05/16/21   Esaw Grandchild A, DO  magnesium 30 MG tablet Take 1 tablet (30 mg total) by mouth 2 (two) times daily. 05/15/21   Pennie Banter, DO  Multiple Vitamin (MULTIVITAMIN WITH MINERALS) TABS tablet Take 1 tablet by mouth daily. 05/16/21   Esaw Grandchild A, DO  oxyCODONE 10 MG TABS Take 1 tablet (10 mg total) by mouth every 4 (four) hours as needed for severe pain. 05/15/21   Esaw Grandchild A, DO  thiamine 100 MG tablet Take 1 tablet (100 mg total) by mouth daily. 05/16/21   Pennie Banter, DO      Allergies    Penicillins and Benadryl [diphenhydramine]    Review of Systems   Review of Systems  Gastrointestinal:   Positive for vomiting.    Physical Exam Updated Vital Signs BP 129/88   Pulse 86   Temp 98.2 F (36.8 C) (Oral)   Resp 20   Ht 5\' 8"  (1.727 m)   Wt 72.6 kg   SpO2 100%   BMI 24.34 kg/m  Physical Exam Vitals and nursing note reviewed.  Constitutional:      General: She is not in acute distress.    Appearance: She is well-developed. She is not diaphoretic.  HENT:     Head: Normocephalic and atraumatic.     Right Ear: External ear normal.     Left Ear: External ear normal.     Nose: Nose normal.     Mouth/Throat:     Mouth: Mucous membranes are dry.  Eyes:     General: No scleral icterus.    Extraocular Movements: Extraocular movements intact.     Conjunctiva/sclera: Conjunctivae normal.     Pupils: Pupils are equal, round, and reactive to light.  Cardiovascular:     Rate and Rhythm: Normal rate and regular rhythm.     Heart sounds: Normal heart sounds. No murmur heard.    No friction rub. No gallop.  Pulmonary:     Effort: Pulmonary effort is normal. No respiratory distress.     Breath sounds: Normal breath sounds.  Abdominal:     General: Bowel sounds are normal. There  is no distension.     Palpations: Abdomen is soft. There is no mass.     Tenderness: There is no abdominal tenderness. There is no guarding.  Musculoskeletal:     Cervical back: Normal range of motion.  Skin:    General: Skin is warm and dry.  Neurological:     Mental Status: She is alert and oriented to person, place, and time.     Comments: Severe tremor at rest   Psychiatric:        Behavior: Behavior normal.     ED Results / Procedures / Treatments   Labs (all labs ordered are listed, but only abnormal results are displayed) Labs Reviewed  RESP PANEL BY RT-PCR (RSV, FLU A&B, COVID)  [id number]  CBC WITH DIFFERENTIAL/PLATELET  COMPREHENSIVE METABOLIC PANEL  LIPASE, BLOOD  URINALYSIS, ROUTINE W REFLEX MICROSCOPIC  ETHANOL  RAPID URINE DRUG SCREEN, HOSP PERFORMED  HCG, SERUM, QUALITATIVE   MAGNESIUM    EKG None  Radiology No results found.  Procedures Procedures    Medications Ordered in ED Medications  LORazepam (ATIVAN) injection 0-4 mg (has no administration in time range)    Or  LORazepam (ATIVAN) tablet 0-4 mg (has no administration in time range)  LORazepam (ATIVAN) injection 0-4 mg (has no administration in time range)    Or  LORazepam (ATIVAN) tablet 0-4 mg (has no administration in time range)  thiamine (VITAMIN B1) tablet 100 mg (has no administration in time range)    Or  thiamine (VITAMIN B1) injection 100 mg (has no administration in time range)  ondansetron (ZOFRAN) injection 4 mg (has no administration in time range)  LORazepam (ATIVAN) injection 2 mg (has no administration in time range)  famotidine (PEPCID) IVPB 20 mg premix (has no administration in time range)  sodium chloride 0.9 % bolus 1,000 mL (has no administration in time range)    ED Course/ Medical Decision Making/ A&P Clinical Course as of 01/17/24 1519  Mon Jan 13, 2024  1555 WBC(!): 2.8 [AH]  1555 Platelets(!): 79 [AH]  1655 Ciwa down from 17-11 [AH]    Clinical Course User Index [AH] Arthor Captain, PA-C                                 Medical Decision Making Amount and/or Complexity of Data Reviewed Labs: ordered. Decision-making details documented in ED Course.  Risk OTC drugs. Prescription drug management. Decision regarding hospitalization.    This patient presents to the ED for concern of vomiting, this involves an extensive number of treatment options, and is a complaint that carries with it a high risk of complications and morbidity.  The emergent differential diagnosis for vomiting includes, but is not limited to ACS/MI, DKA, Ischemic bowel, Meningitis, Sepsis, Acute gastric dilation, Adrenal insufficiency, Appendicitis,  Bowel obstruction/ileus, Carbon monoxide poisoning, Cholecystitis, Electrolyte abnormalities, Elevated ICP, Gastric outlet obstruction,  Pancreatitis, Ruptured viscus, Biliary colic, Cannabinoid hyperemesis syndrome, Gastritis, Gastroenteritis, Gastroparesis,  Narcotic withdrawal, Peptic ulcer disease, and UTI   Co morbidities: Severe alcohol dependence  Social Determinants of Health:  homeless, unmet transportations needs, high risk alcohol abuse  Additional history:  {Additional history obtained from [employer]   Lab Tests:  I Ordered, and personally interpreted labs.  The pertinent results include:   Wbc 2.9/ PLT 70- likely marrow suppression from etoh abuse Resp pnl neg Lipase wnl Urine pendign Imaging Studies: None  Cardiac Monitoring/ECG:  The patient was maintained on a  cardiac monitor.  I personally viewed and interpreted the cardiac monitored which showed an underlying rhythm of: sinus tachycardia  Medicines ordered and prescription drug management:  I ordered medication including  Medications  ondansetron (ZOFRAN) injection 4 mg (4 mg Intravenous Given 01/13/24 1443)  LORazepam (ATIVAN) injection 2 mg (2 mg Intravenous Given 01/13/24 1443)  famotidine (PEPCID) IVPB 20 mg premix (0 mg Intravenous Stopped 01/13/24 1514)  sodium chloride 0.9 % bolus 1,000 mL (0 mLs Intravenous Stopped 01/13/24 1550)  magnesium sulfate IVPB 2 g 50 mL (0 g Intravenous Stopped 01/14/24 0022)  chlordiazePOXIDE (LIBRIUM) capsule 50 mg (50 mg Oral Given 01/14/24 0823)  LORazepam (ATIVAN) injection 3 mg (3 mg Intravenous Given 01/14/24 1710)   for etoh withdrawal Reevaluation of the patient after these medicines showed that the patient improved I have reviewed the patients home medicines and have made adjustments as needed    Consultations Obtained: Dr Adela Glimpse for admission  Problem List / ED Course:     ICD-10-CM   1. Severe alcohol dependence (HCC)  F10.20     2. Nausea and vomiting, unspecified vomiting type  R11.2       MDM: patient with ETOH dependence and vomiting    Dispostion:  After consideration of the  diagnostic results and the patients response to treatment, I feel that the patent would benefit from admission.         Final Clinical Impression(s) / ED Diagnoses Final diagnoses:  None    Rx / DC Orders ED Discharge Orders     None         Arthor Captain, PA-C 01/17/24 1538    Tegeler, Canary Brim, MD 01/23/24 2239
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
## 2024-01-13 NOTE — Subjective & Objective (Signed)
 Hx of severe etOH abuse, has been having a bit of nausea and vomiting for the past 2 wks unable to keep Etoh down, needs a fith of liquor to avoid Dt's CIWA was at 17 on arrival  No abd pain

## 2024-01-13 NOTE — Assessment & Plan Note (Signed)
 Order CIWA protocol Transition care consult  Encourage EtOH cessation

## 2024-01-13 NOTE — Assessment & Plan Note (Signed)
 Persistent Right base within normal limits Patient endorses that she feels like this is secondary to her alcohol abuse. Abdomen nontender Supportive management and continue to monitor Reglan as needed Rehydrate

## 2024-01-13 NOTE — ED Triage Notes (Signed)
 Patient brought in by EMS due to tremors and vomiting. Per patient she has been having episodes of vomiting for 1 week and had 1 episode of seizures. Unknown if seizure was witnessed or not. Patient currently complaining of severe dizziness. Pt reports last alcohol drink was yesterday, unable to drink today due to vomiting. Patient A&O X 4.

## 2024-01-14 ENCOUNTER — Encounter (HOSPITAL_COMMUNITY): Payer: Self-pay | Admitting: Student in an Organized Health Care Education/Training Program

## 2024-01-14 ENCOUNTER — Inpatient Hospital Stay (HOSPITAL_COMMUNITY)

## 2024-01-14 DIAGNOSIS — F10932 Alcohol use, unspecified with withdrawal with perceptual disturbance: Secondary | ICD-10-CM | POA: Diagnosis not present

## 2024-01-14 DIAGNOSIS — Z87898 Personal history of other specified conditions: Secondary | ICD-10-CM | POA: Diagnosis not present

## 2024-01-14 LAB — RAPID URINE DRUG SCREEN, HOSP PERFORMED
Amphetamines: NOT DETECTED
Barbiturates: NOT DETECTED
Benzodiazepines: POSITIVE — AB
Cocaine: NOT DETECTED
Opiates: NOT DETECTED
Tetrahydrocannabinol: POSITIVE — AB

## 2024-01-14 LAB — HEPATITIS PANEL, ACUTE
HCV Ab: NONREACTIVE
Hep A IgM: NONREACTIVE
Hep B C IgM: NONREACTIVE
Hepatitis B Surface Ag: NONREACTIVE

## 2024-01-14 LAB — URINALYSIS, COMPLETE (UACMP) WITH MICROSCOPIC
Bilirubin Urine: NEGATIVE
Glucose, UA: NEGATIVE mg/dL
Hgb urine dipstick: NEGATIVE
Ketones, ur: 20 mg/dL — AB
Nitrite: NEGATIVE
Protein, ur: NEGATIVE mg/dL
Specific Gravity, Urine: 1.016 (ref 1.005–1.030)
WBC, UA: >50 WBC/hpf (ref 0–5)
pH: 7 (ref 5.0–8.0)

## 2024-01-14 LAB — CBC
HCT: 37.7 % (ref 36.0–46.0)
Hemoglobin: 12.9 g/dL (ref 12.0–15.0)
MCH: 34.3 pg — ABNORMAL HIGH (ref 26.0–34.0)
MCHC: 34.2 g/dL (ref 30.0–36.0)
MCV: 100.3 fL — ABNORMAL HIGH (ref 80.0–100.0)
Platelets: 70 10*3/uL — ABNORMAL LOW (ref 150–400)
RBC: 3.76 MIL/uL — ABNORMAL LOW (ref 3.87–5.11)
RDW: 14.1 % (ref 11.5–15.5)
WBC: 2.9 10*3/uL — ABNORMAL LOW (ref 4.0–10.5)
nRBC: 0 % (ref 0.0–0.2)

## 2024-01-14 LAB — LIPASE, BLOOD: Lipase: 47 U/L (ref 11–51)

## 2024-01-14 LAB — HCG, QUANTITATIVE, PREGNANCY: hCG, Beta Chain, Quant, S: 2 m[IU]/mL (ref <5)

## 2024-01-14 LAB — COMPREHENSIVE METABOLIC PANEL
ALT: 44 U/L (ref 0–44)
AST: 101 U/L — ABNORMAL HIGH (ref 15–41)
Albumin: 3.7 g/dL (ref 3.5–5.0)
Alkaline Phosphatase: 59 U/L (ref 38–126)
Anion gap: 10 (ref 5–15)
BUN: 8 mg/dL (ref 6–20)
CO2: 24 mmol/L (ref 22–32)
Calcium: 9.2 mg/dL (ref 8.9–10.3)
Chloride: 101 mmol/L (ref 98–111)
Creatinine, Ser: 0.31 mg/dL — ABNORMAL LOW (ref 0.44–1.00)
GFR, Estimated: >60 mL/min (ref >60)
Glucose, Bld: 81 mg/dL (ref 70–99)
Potassium: 3.3 mmol/L — ABNORMAL LOW (ref 3.5–5.1)
Sodium: 135 mmol/L (ref 135–145)
Total Bilirubin: 1.6 mg/dL — ABNORMAL HIGH (ref 0.0–1.2)
Total Protein: 6.8 g/dL (ref 6.5–8.1)

## 2024-01-14 LAB — PROTIME-INR
INR: 1 (ref 0.8–1.2)
Prothrombin Time: 13.2 s (ref 11.4–15.2)

## 2024-01-14 LAB — PHOSPHORUS: Phosphorus: 2.8 mg/dL (ref 2.5–4.6)

## 2024-01-14 LAB — HIV ANTIBODY (ROUTINE TESTING W REFLEX): HIV Screen 4th Generation wRfx: NONREACTIVE

## 2024-01-14 LAB — MAGNESIUM: Magnesium: 2 mg/dL (ref 1.7–2.4)

## 2024-01-14 LAB — CK: Total CK: 146 U/L (ref 38–234)

## 2024-01-14 MED ORDER — LOPERAMIDE HCL 2 MG PO CAPS
2.0000 mg | ORAL_CAPSULE | ORAL | Status: DC | PRN
  Administered 2024-01-14: 4 mg via ORAL
  Filled 2024-01-14: qty 2

## 2024-01-14 MED ORDER — LORAZEPAM 2 MG/ML IJ SOLN
3.0000 mg | Freq: Once | INTRAMUSCULAR | Status: AC
  Administered 2024-01-14: 3 mg via INTRAVENOUS
  Filled 2024-01-14: qty 2

## 2024-01-14 MED ORDER — CHLORDIAZEPOXIDE HCL 25 MG PO CAPS
25.0000 mg | ORAL_CAPSULE | Freq: Four times a day (QID) | ORAL | Status: DC
  Administered 2024-01-14: 25 mg via ORAL
  Filled 2024-01-14: qty 1

## 2024-01-14 MED ORDER — CHLORDIAZEPOXIDE HCL 25 MG PO CAPS: 25.0000 mg | ORAL_CAPSULE | Freq: Three times a day (TID) | ORAL | Status: DC

## 2024-01-14 MED ORDER — ESCITALOPRAM OXALATE 20 MG PO TABS
20.0000 mg | ORAL_TABLET | Freq: Every day | ORAL | Status: DC
  Administered 2024-01-14: 20 mg via ORAL
  Filled 2024-01-14: qty 2

## 2024-01-14 MED ORDER — CHLORDIAZEPOXIDE HCL 25 MG PO CAPS
50.0000 mg | ORAL_CAPSULE | Freq: Once | ORAL | Status: AC
  Administered 2024-01-14: 50 mg via ORAL
  Filled 2024-01-14: qty 2

## 2024-01-14 MED ORDER — CHLORDIAZEPOXIDE HCL 25 MG PO CAPS: 25.0000 mg | ORAL_CAPSULE | Freq: Every day | ORAL | Status: DC

## 2024-01-14 MED ORDER — CHLORDIAZEPOXIDE HCL 25 MG PO CAPS: 25.0000 mg | ORAL_CAPSULE | ORAL | Status: DC

## 2024-01-14 MED ORDER — HYDROXYZINE HCL 25 MG PO TABS: 25.0000 mg | ORAL_TABLET | Freq: Four times a day (QID) | ORAL | Status: DC | PRN

## 2024-01-14 NOTE — Progress Notes (Addendum)
 Patient in ETOH withdrawal, removed telemetry, spO2, adamant about leaving AMA, 1735 found completely dressed in room, counseled on high risk for falls, potential seizures, and highly likely of being readmitted to the hospital. MD notified and aware. Patient Alert and oriented, has capacity to make decisions. AMA paper signed. Patient released.

## 2024-01-14 NOTE — Assessment & Plan Note (Addendum)
 Patient with persistent nausea vomiting feeling unwell and malaise Urine culture sent If urine culture positive consider treating

## 2024-01-14 NOTE — ED Notes (Signed)
 Patient given breakfast tray.

## 2024-01-14 NOTE — Progress Notes (Signed)
 Personal medications given back to patient prior to pt leaving

## 2024-01-14 NOTE — Progress Notes (Signed)
 RN called to room. Staff found open bottles in pt belonging bag that was sitting open on pt bed. Two bottles of alcohol confiscated (1-40 oz beer and half a bottle of wine). A bag of miscellaneous medications also confiscated. Security called to floor to dispose of alcohol. Alcohol flushed in staff bathroom by Romie Minus from security and this RN as witness. Medications placed in secure bag and taken to pharmacy to store while pt admitted by this RN.

## 2024-01-14 NOTE — Significant Event (Signed)
 Patient in ETOH withdrawal, This RN assisted in scoring patients latest CIWA, CIWA elevated and is 29. 4mg  ativan given per protocol, if CIWA continues to be elevated please reach back out to MD and Rapid Response    01/14/24 1614  CIWA-Ar  BP (!) 120/90  Pulse Rate 69  Nausea and Vomiting 2  Tactile Disturbances 3  Tremor 6  Auditory Disturbances 2  Paroxysmal Sweats 3  Visual Disturbances 3  Anxiety 3  Headache, Fullness in Head 3  Agitation 4  Orientation and Clouding of Sensorium 0  CIWA-Ar Total 29

## 2024-01-14 NOTE — Progress Notes (Addendum)
 PROGRESS NOTE  Miranda Wood    DOB: [birth year], 48 y.o.  ONG:[id number]    Code Status: Full Code   DOA: 01/13/2024   LOS: 0   Brief hospital course  Miranda Wood is a 48 y.o. female with medical history significant of ETOH  dependence.  She states that she typically drinks 1-2 fifths of liquor daily. Last drink was Sunday and has not been able to keep down any alcohol since then due to vomiting. Endorses episode yesterday that she thinks was a seizure. Has history of withdrawal seizures.  CIWA was at 17 on arrival  Patient was admitted to medicine service for further workup and management of alcohol withdrawal as outlined in detail below.  01/14/24 -continues to have significant tremors and discomfort.  UPDATE: patient left AMA. I was not able to see patient just prior to her leaving. Most recent CIWA score was 29>29 and received 7mg  ativan in the hours prior to departure without improvement in symptoms.   Assessment & Plan  Principal Problem:   Alcohol withdrawal (HCC) Active Problems:   Alcohol dependence with withdrawal with complication (HCC)   Seizure due to alcohol withdrawal (HCC)   Nausea and vomiting   Thrombocytopenia (HCC)   Hypomagnesemia   Pyuria   Neuropathy  Alcohol dependence with withdrawal with complication (HCC)- history of seizures with withdrawal. Last drink she was able to keep down was Sunday. ethanol <10 on presentation. CT head nonacute.  - close CIWA monitoring. She is acutely tremulous today. Denies active hallucinations. She is oriented.  - changed to librium scheduled loading dose with taper - continue ativan PRN per CIWA evaluations on sliding scale.  - continuous telemetry - TOC consult for resources.    Nausea and vomiting  Hypomagnesemia- monitor for refeeding syndrome. Likely severe malnutrition given high amount of alcohol consumption - supportive care PRN - cautious use of antiemetics with borderline prolonged Qtc -  thiamine, folate supplements.  - advance diet as tolerated.  - RUQ [place]. Assess for gallbladder disease contributing, adding on lipase to assess for pancreatitis, and hepatitis panel to further assess for contributions, beta hcg for less likely pregnancy. Although alcoholic gastritis is most likely, want to be careful to fall prey to relying on that diagnosis without further investigating other pathology that could ultimately benefit patient.    Thrombocytopenia (HCC)-  platelets 79> 70. No active bleeding. Indicative of liver disease. Suspect alcoholic cirrhosis.  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   Chronic depression-  - continue home lexapro  Body mass index is 24.34 kg/m.  VTE ppx: SCDs Start: 01/13/24 2318  Diet:     Diet   Diet clear liquid Room service appropriate? Yes; Fluid consistency: Thin   Consultants: None   Subjective 01/14/24    Pt reports feeling very anxious and shaky. Nausea is somewhat improved.    Objective   Vitals:   01/14/24 0008 01/14/24 0143 01/14/24 0200 01/14/24 0638  BP: (!) 127/91 131/87 (!) 124/93 119/89  Pulse: 82 92 82 72  Resp: 16 20 19  (!) 21  Temp:  98.5 F (36.9 C)  98.4 F (36.9 C)  TempSrc:  Oral  Oral  SpO2: 95% 100% 99% 99%  Weight:      Height:       No intake or output data in the 24 hours ending 01/14/24 0738 Filed Weights   01/13/24 1409  Weight: 72.6 kg    Physical Exam:  General: awake, alert, in moderate distress Respiratory: normal respiratory effort. CTAB, tachypnea Cardiovascular: quick capillary refill, tachycardia, no JVD, murmurs Gastrointestinal: soft, NT, ND Nervous: A&O x3. Significant bilateral hand tremor Extremities: moves all equally, no edema, normal tone Skin: dry, intact, normal temperature, normal color. No rashes, lesions or ulcers on exposed skin Psychiatry: normal mood, congruent affect.  Denies hallucinations. Answers questions appropriately and memory appears to be intact.   Labs   I have personally reviewed the following labs and imaging studies CBC    Component Value Date/Time   WBC 2.9 (L) 01/14/2024 0419   RBC 3.76 (L) 01/14/2024 0419   HGB 12.9 01/14/2024 0419   HCT 37.7 01/14/2024 0419   PLT 70 (L) 01/14/2024 0419   MCV 100.3 (H) 01/14/2024 0419   MCH 34.3 (H) 01/14/2024 0419   MCHC 34.2 01/14/2024 0419   RDW 14.1 01/14/2024 0419   LYMPHSABS 0.8 01/13/2024 1432   MONOABS 0.3 01/13/2024 1432   EOSABS 0.0 01/13/2024 1432   BASOSABS 0.0 01/13/2024 1432      Latest Ref Rng & Units 01/14/2024    4:19 AM 01/13/2024    2:32 PM 05/15/2021    3:57 AM  BMP  Glucose 70 - 99 mg/dL 81  295  284   BUN 6 - 20 mg/dL 8  7  <5   Creatinine 1.32 - 1.00 mg/dL 4.40  1.02  7.25   Sodium 135 - 145 mmol/L 135  135  133   Potassium 3.5 - 5.1 mmol/L 3.3  3.5  4.1   Chloride 98 - 111 mmol/L 101  98  99   CO2 22 - 32 mmol/L 24  23  29    Calcium 8.9 - 10.3 mg/dL 9.2  9.1  8.5     CT HEAD WO CONTRAST ( ) Result Date: 01/13/2024 CLINICAL DATA:  Tremors and vomiting. EXAM: CT HEAD WITHOUT CONTRAST TECHNIQUE: Contiguous axial images were obtained from the base of the skull through the vertex without intravenous contrast. RADIATION DOSE REDUCTION: This exam was performed according to the departmental dose-optimization program which includes automated exposure control, adjustment of the mA and/or kV according to patient size and/or use of iterative reconstruction technique. COMPARISON:  April 25, 2021 FINDINGS: Brain: No evidence of acute infarction, hemorrhage, hydrocephalus, extra-axial collection or mass lesion/mass effect. Mild areas of, chronic deep white matter low attenuation are again seen. Vascular: No hyperdense vessel or unexpected calcification. Skull: Normal. Negative for fracture or focal lesion. Sinuses/Orbits: No acute finding. Other: None. IMPRESSION: 1. No acute  intracranial abnormality. 2. Mild chronic white matter small vessel ischemic disease. Electronically Signed   By: Aram Candela M.D.   On: 01/13/2024 22:05   DG Abd 1 View Result Date: 01/13/2024 CLINICAL DATA:  Intractable nausea and vomiting. Severe dizziness. One episode of seizure. EXAM: ABDOMEN - 1 VIEW COMPARISON:  None Available. FINDINGS: The bowel gas pattern is normal. No radio-opaque calculi or other acute radiographic abnormality are seen. IMPRESSION: Negative. Electronically Signed   By: Thornell Sartorius M.D.   On: 01/13/2024 21:36    Disposition Plan & Communication  Patient status: Observation  Admitted From: Home Planned disposition location: Home Anticipated discharge date: 3/20 pending withdrawal treatment completed  Family Communication: none at bedside    Author: Leeroy Bock, DO [facility] 01/14/2024, 7:38 AM   Available by Epic secure chat 7AM-7PM. If 7PM-7AM, please contact night-coverage.  [facility] contact information found on [email or web address].

## 2024-01-16 LAB — URINE CULTURE
Culture: >=100000 — AB
Special Requests: NORMAL

## 2024-01-17 LAB — VITAMIN B1: Vitamin B1 (Thiamine): 186.4 nmol/L (ref 66.5–200.0)

## 2024-06-04 ENCOUNTER — Emergency Department (HOSPITAL_COMMUNITY)

## 2024-06-04 ENCOUNTER — Other Ambulatory Visit: Payer: Self-pay

## 2024-06-04 ENCOUNTER — Emergency Department (HOSPITAL_COMMUNITY)
Admission: EM | Admit: 2024-06-04 | Discharge: 2024-06-04 | Disposition: A | Discharge status: Home / Self Care | Attending: Emergency Medicine | Admitting: Emergency Medicine

## 2024-06-04 ENCOUNTER — Encounter (HOSPITAL_COMMUNITY): Payer: Self-pay

## 2024-06-04 DIAGNOSIS — R519 Headache, unspecified: Secondary | ICD-10-CM | POA: Insufficient documentation

## 2024-06-04 DIAGNOSIS — F10120 Alcohol abuse with intoxication, uncomplicated: Secondary | ICD-10-CM | POA: Diagnosis not present

## 2024-06-04 DIAGNOSIS — F10920 Alcohol use, unspecified with intoxication, uncomplicated: Secondary | ICD-10-CM

## 2024-06-04 DIAGNOSIS — Y908 Blood alcohol level of 240 mg/100 ml or more: Secondary | ICD-10-CM | POA: Insufficient documentation

## 2024-06-04 DIAGNOSIS — S0993XA Unspecified injury of face, initial encounter: Secondary | ICD-10-CM | POA: Diagnosis present

## 2024-06-04 DIAGNOSIS — S0083XA Contusion of other part of head, initial encounter: Secondary | ICD-10-CM | POA: Diagnosis not present

## 2024-06-04 DIAGNOSIS — R000 Tachycardia, unspecified: Secondary | ICD-10-CM | POA: Diagnosis not present

## 2024-06-04 HISTORY — DX: Respiratory tuberculosis unspecified: A15.9

## 2024-06-04 LAB — URINALYSIS, ROUTINE W REFLEX MICROSCOPIC
Bilirubin Urine: NEGATIVE
Glucose, UA: NEGATIVE mg/dL
Hgb urine dipstick: NEGATIVE
Ketones, ur: NEGATIVE mg/dL
Nitrite: POSITIVE — AB
Protein, ur: NEGATIVE mg/dL
Specific Gravity, Urine: 1.008 (ref 1.005–1.030)
pH: 6 (ref 5.0–8.0)

## 2024-06-04 LAB — CBC WITH DIFFERENTIAL/PLATELET
Abs Immature Granulocytes: 0.06 K/uL (ref 0.00–0.07)
Basophils Absolute: 0.1 K/uL (ref 0.0–0.1)
Basophils Relative: 2 %
Eosinophils Absolute: 0.1 K/uL (ref 0.0–0.5)
Eosinophils Relative: 2 %
HCT: 36.7 % (ref 36.0–46.0)
Hemoglobin: 12 g/dL (ref 12.0–15.0)
Immature Granulocytes: 1 %
Lymphocytes Relative: 40 %
Lymphs Abs: 1.8 K/uL (ref 0.7–4.0)
MCH: 35.6 pg — ABNORMAL HIGH (ref 26.0–34.0)
MCHC: 32.7 g/dL (ref 30.0–36.0)
MCV: 108.9 fL — ABNORMAL HIGH (ref 80.0–100.0)
Monocytes Absolute: 0.4 K/uL (ref 0.1–1.0)
Monocytes Relative: 9 %
Neutro Abs: 2.1 K/uL (ref 1.7–7.7)
Neutrophils Relative %: 46 %
Platelets: 104 K/uL — ABNORMAL LOW (ref 150–400)
RBC: 3.37 MIL/uL — ABNORMAL LOW (ref 3.87–5.11)
RDW: 13.2 % (ref 11.5–15.5)
WBC: 4.6 K/uL (ref 4.0–10.5)
nRBC: 0 % (ref 0.0–0.2)

## 2024-06-04 LAB — COMPREHENSIVE METABOLIC PANEL WITH GFR
ALT: 43 U/L (ref 0–44)
AST: 248 U/L — ABNORMAL HIGH (ref 15–41)
Albumin: 3.6 g/dL (ref 3.5–5.0)
Alkaline Phosphatase: 89 U/L (ref 38–126)
Anion gap: 13 (ref 5–15)
BUN: 6 mg/dL (ref 6–20)
CO2: 29 mmol/L (ref 22–32)
Calcium: 8.4 mg/dL — ABNORMAL LOW (ref 8.9–10.3)
Chloride: 99 mmol/L (ref 98–111)
Creatinine, Ser: 0.61 mg/dL (ref 0.44–1.00)
GFR, Estimated: >60 mL/min (ref >60)
Glucose, Bld: 97 mg/dL (ref 70–99)
Potassium: 3.3 mmol/L — ABNORMAL LOW (ref 3.5–5.1)
Sodium: 141 mmol/L (ref 135–145)
Total Bilirubin: 0.8 mg/dL (ref 0.0–1.2)
Total Protein: 7.6 g/dL (ref 6.5–8.1)

## 2024-06-04 LAB — PROTIME-INR
INR: 0.9 (ref 0.8–1.2)
Prothrombin Time: 13.2 s (ref 11.4–15.2)

## 2024-06-04 LAB — RAPID URINE DRUG SCREEN, HOSP PERFORMED
Amphetamines: NOT DETECTED
Barbiturates: NOT DETECTED
Benzodiazepines: NOT DETECTED
Cocaine: NOT DETECTED
Opiates: NOT DETECTED
Tetrahydrocannabinol: NOT DETECTED

## 2024-06-04 LAB — ETHANOL: Alcohol, Ethyl (B): 497 mg/dL (ref <15)

## 2024-06-04 LAB — HCG, SERUM, QUALITATIVE: Preg, Serum: NEGATIVE

## 2024-06-04 MED ORDER — SODIUM CHLORIDE 0.9 % IV BOLUS
1000.0000 mL | Freq: Once | INTRAVENOUS | Status: AC
  Administered 2024-06-04: 1000 mL via INTRAVENOUS

## 2024-06-04 NOTE — Discharge Instructions (Signed)
 1.  Apply ice packs to your face.  Try to sleep with your head elevated about 30 degrees. 2.  There are no broken bones at this time.  There is no bleeding inside the brain. 3.  Schedule follow-up with your doctor for recheck in the next 3 to 5 days.
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
## 2024-08-15 NOTE — Progress Notes (Signed)
 Pt screened for HTN, refused SDOH. Encouraged to follow u with PCP for HR

## 2024-10-06 NOTE — Progress Notes (Unsigned)
 Pt attended 08/15/2024 screening event with BP of 114/96. At event pt did not indicate any SDOH needs.  Per initial f/u pt was reached out via phone and was left vm. Pt is homeless and letter will be mailed to [facility], due to pt being seen my provider there. Pt was mailed letter with Get Care Now flyer, [facility] flyer, food, housing, & transportation resources, smoking cessation flyer, BP management flyer, BP log, [place] [address] card, & [place] flyer.  Per chart review pt does have a PCP Alston Jenkins Houseman; [facility]), insurance, and is a smoker. Pt does indicate food, housing, & transportation SDOH needs at this time.  Additional pt f/u to be scheduled at this time per health equity protocol.
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
## 2024-11-27 ENCOUNTER — Encounter (HOSPITAL_COMMUNITY): Payer: Self-pay | Admitting: Emergency Medicine

## 2024-11-27 ENCOUNTER — Other Ambulatory Visit: Payer: Self-pay

## 2024-11-27 ENCOUNTER — Emergency Department (HOSPITAL_COMMUNITY)

## 2024-11-27 ENCOUNTER — Emergency Department (HOSPITAL_COMMUNITY)
Admission: EM | Admit: 2024-11-27 | Discharge: 2024-11-29 | Disposition: A | Attending: Emergency Medicine | Admitting: Emergency Medicine

## 2024-11-27 DIAGNOSIS — F10939 Alcohol use, unspecified with withdrawal, unspecified: Secondary | ICD-10-CM

## 2024-11-27 DIAGNOSIS — S060XAA Concussion with loss of consciousness status unknown, initial encounter: Secondary | ICD-10-CM | POA: Insufficient documentation

## 2024-11-27 DIAGNOSIS — S42035A Nondisplaced fracture of lateral end of left clavicle, initial encounter for closed fracture: Secondary | ICD-10-CM | POA: Insufficient documentation

## 2024-11-27 DIAGNOSIS — F332 Major depressive disorder, recurrent severe without psychotic features: Secondary | ICD-10-CM | POA: Diagnosis present

## 2024-11-27 DIAGNOSIS — F10230 Alcohol dependence with withdrawal, uncomplicated: Secondary | ICD-10-CM | POA: Insufficient documentation

## 2024-11-27 DIAGNOSIS — Y904 Blood alcohol level of 80-99 mg/100 ml: Secondary | ICD-10-CM | POA: Insufficient documentation

## 2024-11-27 DIAGNOSIS — F1020 Alcohol dependence, uncomplicated: Secondary | ICD-10-CM | POA: Diagnosis present
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
## 2024-11-28 DIAGNOSIS — F1020 Alcohol dependence, uncomplicated: Secondary | ICD-10-CM | POA: Insufficient documentation

## 2024-11-28 LAB — COMPREHENSIVE METABOLIC PANEL WITH GFR
ALT: 24 U/L (ref 0–44)
AST: 89 U/L — ABNORMAL HIGH (ref 15–41)
Albumin: 3.4 g/dL — ABNORMAL LOW (ref 3.5–5.0)
Alkaline Phosphatase: 92 U/L (ref 38–126)
Anion gap: 14 (ref 5–15)
BUN: 9 mg/dL (ref 6–20)
CO2: 21 mmol/L — ABNORMAL LOW (ref 22–32)
Calcium: 8.1 mg/dL — ABNORMAL LOW (ref 8.9–10.3)
Chloride: 102 mmol/L (ref 98–111)
Creatinine, Ser: 0.48 mg/dL (ref 0.44–1.00)
GFR, Estimated: >60 mL/min
Glucose, Bld: 78 mg/dL (ref 70–99)
Potassium: 3.8 mmol/L (ref 3.5–5.1)
Sodium: 137 mmol/L (ref 135–145)
Total Bilirubin: 0.5 mg/dL (ref 0.0–1.2)
Total Protein: 6.4 g/dL — ABNORMAL LOW (ref 6.5–8.1)

## 2024-11-28 LAB — CBC WITH DIFFERENTIAL/PLATELET
Abs Immature Granulocytes: 0.02 10*3/uL (ref 0.00–0.07)
Basophils Absolute: 0.1 10*3/uL (ref 0.0–0.1)
Basophils Relative: 1 %
Eosinophils Absolute: 0.1 10*3/uL (ref 0.0–0.5)
Eosinophils Relative: 1 %
HCT: 33.1 % — ABNORMAL LOW (ref 36.0–46.0)
Hemoglobin: 11.4 g/dL — ABNORMAL LOW (ref 12.0–15.0)
Immature Granulocytes: 0 %
Lymphocytes Relative: 43 %
Lymphs Abs: 2.4 10*3/uL (ref 0.7–4.0)
MCH: 37.3 pg — ABNORMAL HIGH (ref 26.0–34.0)
MCHC: 34.4 g/dL (ref 30.0–36.0)
MCV: 108.2 fL — ABNORMAL HIGH (ref 80.0–100.0)
Monocytes Absolute: 0.4 10*3/uL (ref 0.1–1.0)
Monocytes Relative: 8 %
Neutro Abs: 2.6 10*3/uL (ref 1.7–7.7)
Neutrophils Relative %: 47 %
Platelets: 178 10*3/uL (ref 150–400)
RBC: 3.06 MIL/uL — ABNORMAL LOW (ref 3.87–5.11)
RDW: 14.1 % (ref 11.5–15.5)
WBC: 5.5 10*3/uL (ref 4.0–10.5)
nRBC: 0 % (ref 0.0–0.2)

## 2024-11-28 LAB — URINE DRUG SCREEN
Amphetamines: NEGATIVE
Barbiturates: POSITIVE — AB
Benzodiazepines: POSITIVE — AB
Cocaine: NEGATIVE
Fentanyl: NEGATIVE
Methadone Scn, Ur: NEGATIVE
Opiates: NEGATIVE
Tetrahydrocannabinol: NEGATIVE

## 2024-11-28 LAB — HCG, SERUM, QUALITATIVE: Preg, Serum: NEGATIVE

## 2024-11-28 LAB — ETHANOL: Alcohol, Ethyl (B): 96 mg/dL — ABNORMAL HIGH

## 2024-11-28 LAB — MAGNESIUM
Magnesium: 1.6 mg/dL — ABNORMAL LOW (ref 1.7–2.4)
Magnesium: 2 mg/dL (ref 1.7–2.4)

## 2024-11-28 MED ORDER — PHENOBARBITAL 32.4 MG PO TABS: 32.4000 mg | ORAL_TABLET | Freq: Three times a day (TID) | ORAL | Status: DC

## 2024-11-28 MED ORDER — IBUPROFEN 400 MG PO TABS
600.0000 mg | ORAL_TABLET | Freq: Three times a day (TID) | ORAL | Status: DC | PRN
  Administered 2024-11-28 (×2): 600 mg via ORAL
  Filled 2024-11-28 (×2): qty 1

## 2024-11-28 MED ORDER — THIAMINE MONONITRATE 100 MG PO TABS
100.0000 mg | ORAL_TABLET | Freq: Every day | ORAL | Status: DC
  Administered 2024-11-28: 100 mg via ORAL
  Filled 2024-11-28: qty 1

## 2024-11-28 MED ORDER — LORAZEPAM 1 MG PO TABS: 0.0000 mg | ORAL_TABLET | Freq: Four times a day (QID) | ORAL | Status: DC

## 2024-11-28 MED ORDER — PHENOBARBITAL 32.4 MG PO TABS: 64.8000 mg | ORAL_TABLET | Freq: Three times a day (TID) | ORAL | Status: DC

## 2024-11-28 MED ORDER — LORAZEPAM 2 MG/ML IJ SOLN: 1.0000 mg | INTRAMUSCULAR | Status: DC | PRN

## 2024-11-28 MED ORDER — LORAZEPAM 2 MG/ML IJ SOLN: 0.0000 mg | Freq: Two times a day (BID) | INTRAMUSCULAR | Status: DC

## 2024-11-28 MED ORDER — KETOROLAC TROMETHAMINE 15 MG/ML IJ SOLN
15.0000 mg | Freq: Once | INTRAMUSCULAR | Status: AC
  Administered 2024-11-28: 15 mg via INTRAVENOUS
  Filled 2024-11-28: qty 1

## 2024-11-28 MED ORDER — LORAZEPAM 1 MG PO TABS: 0.0000 mg | ORAL_TABLET | Freq: Two times a day (BID) | ORAL | Status: DC

## 2024-11-28 MED ORDER — LORAZEPAM 2 MG/ML IJ SOLN
1.0000 mg | Freq: Once | INTRAMUSCULAR | Status: AC
  Administered 2024-11-28: 1 mg via INTRAVENOUS
  Filled 2024-11-28: qty 1

## 2024-11-28 MED ORDER — ACETAMINOPHEN 325 MG PO TABS
650.0000 mg | ORAL_TABLET | Freq: Once | ORAL | Status: AC
  Administered 2024-11-28: 650 mg via ORAL
  Filled 2024-11-28: qty 2

## 2024-11-28 MED ORDER — MAGNESIUM SULFATE 2 GM/50ML IV SOLN
2.0000 g | Freq: Once | INTRAVENOUS | Status: AC
  Administered 2024-11-28: 2 g via INTRAVENOUS
  Filled 2024-11-28: qty 50

## 2024-11-28 MED ORDER — SODIUM CHLORIDE 0.9 % IV BOLUS (SEPSIS)
1000.0000 mL | Freq: Once | INTRAVENOUS | Status: AC
  Administered 2024-11-28: 1000 mL via INTRAVENOUS

## 2024-11-28 MED ORDER — PHENOBARBITAL 32.4 MG PO TABS
97.2000 mg | ORAL_TABLET | Freq: Three times a day (TID) | ORAL | Status: DC
  Administered 2024-11-28 (×3): 97.2 mg via ORAL
  Filled 2024-11-28 (×3): qty 3

## 2024-11-28 MED ORDER — THIAMINE HCL 100 MG/ML IJ SOLN: 100.0000 mg | Freq: Every day | INTRAMUSCULAR | Status: DC

## 2024-11-28 MED ORDER — HYDROCODONE-ACETAMINOPHEN 5-325 MG PO TABS
1.0000 | ORAL_TABLET | Freq: Four times a day (QID) | ORAL | Status: DC | PRN
  Administered 2024-11-28 (×2): 1 via ORAL
  Filled 2024-11-28 (×2): qty 1

## 2024-11-28 MED ORDER — LORAZEPAM 2 MG/ML IJ SOLN: 0.0000 mg | Freq: Four times a day (QID) | INTRAMUSCULAR | Status: DC
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
## 2024-11-28 NOTE — Progress Notes (Signed)
 Orthopedic Tech Progress Note Patient Details:  Miranda Wood 1975-12-12 [id number]  Ortho Devices Type of Ortho Device: Sling immobilizer Ortho Device/Splint Location: lue Ortho Device/Splint Interventions: Ordered, Application, Adjustment   Post Interventions Patient Tolerated: Well Instructions Provided: Care of device, Adjustment of device  Chandra Dorn PARAS 11/28/2024, 8:03 AM

## 2024-11-28 NOTE — ED Notes (Signed)
 Pt ambulatory to restroom, no assistance from staff.

## 2024-11-28 NOTE — ED Notes (Signed)
Safe Transport called for pt.  
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
## 2024-11-28 NOTE — Progress Notes (Signed)
 [facility] phoned for consult.

## 2024-11-29 ENCOUNTER — Other Ambulatory Visit (HOSPITAL_COMMUNITY): Admission: EM | Admit: 2024-11-29 | Payer: MEDICAID | Source: Intra-hospital | Admitting: Psychiatry

## 2024-11-29 ENCOUNTER — Other Ambulatory Visit: Payer: Self-pay

## 2024-11-29 DIAGNOSIS — F1010 Alcohol abuse, uncomplicated: Secondary | ICD-10-CM | POA: Diagnosis present

## 2024-11-29 DIAGNOSIS — F10930 Alcohol use, unspecified with withdrawal, uncomplicated: Secondary | ICD-10-CM

## 2024-11-29 DIAGNOSIS — F1994 Other psychoactive substance use, unspecified with psychoactive substance-induced mood disorder: Secondary | ICD-10-CM

## 2024-11-29 DIAGNOSIS — E559 Vitamin D deficiency, unspecified: Secondary | ICD-10-CM

## 2024-11-29 MED ORDER — OLANZAPINE 5 MG PO TBDP
5.0000 mg | ORAL_TABLET | Freq: Three times a day (TID) | ORAL | Status: AC | PRN
  Administered 2024-11-30 – 2024-12-04 (×4): 5 mg via ORAL
  Filled 2024-11-29 (×5): qty 1

## 2024-11-29 MED ORDER — THIAMINE MONONITRATE 100 MG PO TABS
100.0000 mg | ORAL_TABLET | Freq: Every day | ORAL | Status: AC
  Administered 2024-11-29 – 2024-12-04 (×5): 100 mg via ORAL
  Filled 2024-11-29 (×6): qty 1

## 2024-11-29 MED ORDER — OLANZAPINE 10 MG IM SOLR: 10.0000 mg | Freq: Three times a day (TID) | INTRAMUSCULAR | Status: AC | PRN

## 2024-11-29 MED ORDER — ALUM & MAG HYDROXIDE-SIMETH 200-200-20 MG/5ML PO SUSP: 30.0000 mL | ORAL | Status: AC | PRN

## 2024-11-29 MED ORDER — MAGNESIUM HYDROXIDE 400 MG/5ML PO SUSP: 30.0000 mL | Freq: Every day | ORAL | Status: AC | PRN

## 2024-11-29 MED ORDER — PHENOBARBITAL 32.4 MG PO TABS
97.2000 mg | ORAL_TABLET | Freq: Three times a day (TID) | ORAL | Status: AC
  Administered 2024-11-29 – 2024-11-30 (×4): 97.2 mg via ORAL
  Filled 2024-11-29 (×4): qty 3

## 2024-11-29 MED ORDER — HYDROCODONE-ACETAMINOPHEN 5-325 MG PO TABS
1.0000 | ORAL_TABLET | Freq: Four times a day (QID) | ORAL | Status: DC | PRN
  Administered 2024-11-29 – 2024-12-01 (×4): 1 via ORAL
  Filled 2024-11-29 (×4): qty 1

## 2024-11-29 MED ORDER — PHENOBARBITAL 32.4 MG PO TABS
32.4000 mg | ORAL_TABLET | Freq: Three times a day (TID) | ORAL | Status: AC
  Administered 2024-12-02 – 2024-12-04 (×6): 32.4 mg via ORAL
  Filled 2024-11-29 (×6): qty 1

## 2024-11-29 MED ORDER — OLANZAPINE 10 MG IM SOLR: 5.0000 mg | Freq: Three times a day (TID) | INTRAMUSCULAR | Status: AC | PRN

## 2024-11-29 MED ORDER — ACETAMINOPHEN 325 MG PO TABS
650.0000 mg | ORAL_TABLET | Freq: Four times a day (QID) | ORAL | Status: AC | PRN
  Administered 2024-11-29 – 2024-12-03 (×5): 650 mg via ORAL
  Filled 2024-11-29 (×5): qty 2

## 2024-11-29 MED ORDER — THIAMINE HCL 100 MG/ML IJ SOLN
100.0000 mg | Freq: Every day | INTRAMUSCULAR | Status: AC
  Administered 2024-11-30: 100 mg via INTRAVENOUS
  Filled 2024-11-29 (×2): qty 2

## 2024-11-29 MED ORDER — PHENOBARBITAL 32.4 MG PO TABS
64.8000 mg | ORAL_TABLET | Freq: Three times a day (TID) | ORAL | Status: AC
  Administered 2024-11-30 – 2024-12-02 (×6): 64.8 mg via ORAL
  Filled 2024-11-29 (×6): qty 2

## 2024-11-29 MED ORDER — IBUPROFEN 600 MG PO TABS
600.0000 mg | ORAL_TABLET | Freq: Three times a day (TID) | ORAL | Status: DC | PRN
  Administered 2024-11-29 – 2024-12-02 (×4): 600 mg via ORAL
  Filled 2024-11-29 (×4): qty 1

## 2024-11-29 NOTE — ED Notes (Signed)
 Patient resting with equal and unlabored respirations. No appeared or reported distress. Monitor for safety.

## 2024-11-29 NOTE — Group Note (Signed)
 Group Topic: Relapse and Recovery  Group Date: 11/29/2024 Start Time: 1315 End Time: 1400 Facilitators: Clodfelter-Simmons, Laylanie Kruczek L, NT  Department: [facility]  Number of Participants: 8  Group Focus: substance abuse education Treatment Modality:  Interpersonal Therapy Interventions utilized were patient education Purpose: relapse prevention strategies  Name: Miranda Wood Date of Birth: 01-26-1976  MR: [id number]    Level of Participation: active Quality of Participation: attentive Interactions with others: gave feedback Mood/Affect: appropriate Triggers (if applicable): na  Cognition: coherent/clear Progress: Gaining insight Response: Patient very attentive to speaker  Plan: patient will be encouraged to continue to attend groups  Patients Problems:  Patient Active Problem List   Diagnosis Date Noted   Alcohol abuse 11/29/2024   Alcohol use disorder, moderate, dependence (HCC) 11/28/2024   Neuropathy 01/13/2024   Hyponatremia 05/14/2021   Alcohol withdrawal (HCC) 05/11/2021   AKI (acute kidney injury) 05/11/2021   Hypokalemia 05/11/2021   Jaundice 05/11/2021   Prolonged QT interval 05/11/2021   Acute hyperactive alcohol withdrawal delirium (HCC) 12/31/2020   Hypomagnesemia 12/31/2020   Hyperbilirubinemia 12/31/2020   Infected sebaceous cyst of back 12/31/2020   Pyuria 12/31/2020   Seizure due to alcohol withdrawal (HCC) 12/30/2020   Nausea and vomiting 12/30/2020   Tobacco use 12/30/2020   Thrombocytopenia 12/30/2020   Seizure (HCC) 12/30/2020   Alcohol dependence with withdrawal with complication (HCC) 04/29/2018   Major depressive disorder, 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

## 2024-11-29 NOTE — ED Notes (Signed)
 Patient presents mostly in her bedroom this evening. Engages with staff when staff initiates. No appeared or reported distress. Monitor for safety.

## 2024-11-29 NOTE — ED Notes (Signed)
 Pt sleeping in bed, no acute distress noted. Respirations even and unlabored. Continue to monitor for safety.

## 2024-11-29 NOTE — Group Note (Signed)
 Group Topic: Understanding Self  Group Date: 11/29/2024 Start Time: 1200 End Time: 1230 Facilitators: Leron Charolette CROME, RN  Department: [facility]  Number of Participants: 6  Group Focus: goals/reality orientation Treatment Modality:  Leisure Development Interventions utilized were problem solving Purpose: express feelings  Name: Miranda Wood Date of Birth: 09-29-1976  MR: [id number]    Level of Participation: : moderate Quality of Participation: cooperative Interactions with others: gave feedback Mood/Affect: appropriate Triggers (if applicable): NA  Cognition: coherent/clear Progress: Moderate Response: Progressive  Plan: patient will be encouraged to participate in Group   Patients Problems:  Patient Active Problem List   Diagnosis Date Noted   Alcohol abuse 11/29/2024   Alcohol use disorder, moderate, dependence (HCC) 11/28/2024   Neuropathy 01/13/2024   Hyponatremia 05/14/2021   Alcohol withdrawal (HCC) 05/11/2021   AKI (acute kidney injury) 05/11/2021   Hypokalemia 05/11/2021   Jaundice 05/11/2021   Prolonged QT interval 05/11/2021   Acute hyperactive alcohol withdrawal delirium (HCC) 12/31/2020   Hypomagnesemia 12/31/2020   Hyperbilirubinemia 12/31/2020   Infected sebaceous cyst of back 12/31/2020   Pyuria 12/31/2020   Seizure due to alcohol withdrawal (HCC) 12/30/2020   Nausea and vomiting 12/30/2020   Tobacco use 12/30/2020   Thrombocytopenia 12/30/2020   Seizure (HCC) 12/30/2020   Alcohol dependence with withdrawal with complication (HCC) 04/29/2018   Major depressive disorder, 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

## 2024-11-29 NOTE — ED Notes (Signed)
 Pt admitted to [facility] this shift. Pt denies SI/HI/AVH. L clavicular/shoulder pain managed with prn advil . Compliant with scheduled meds. She was calm, cooperative, depressed, and pleasant. No behavioral concerns at this time. Pt sleeping in bed, no acute distress noted. Respirations even and unlabored. Continue to monitor for safety.

## 2024-11-30 LAB — FOLATE: Folate: 6.4 ng/mL

## 2024-11-30 LAB — VITAMIN B12: Vitamin B-12: 546 pg/mL (ref 180–914)

## 2024-11-30 LAB — VITAMIN D 25 HYDROXY (VIT D DEFICIENCY, FRACTURES): Vit D, 25-Hydroxy: 13.4 ng/mL — ABNORMAL LOW (ref 30–100)

## 2024-11-30 NOTE — Group Note (Signed)
 Group Topic: Fears and Unhealthy Coping Skills  Group Date: 11/30/2024 Start Time: 1100 End Time: 1200 Facilitators: Atha Mcbain, LCSW  Department: [facility]  Number of Participants: 7  Group Focus: acceptance, anxiety, communication, forgiveness, healthy friendships, leisure skills, substance abuse education, and suicide prevention skills Treatment Modality:  Cognitive Behavioral Therapy Interventions utilized were group exercise Purpose: enhance coping skills, explore maladaptive thinking, express irrational fears, relapse prevention strategies, and trigger / craving management  Writer utilized CBT to explore and complete the Safety Plan with each client.  Each client elaborated on coping skills, triggers, waring signs, support system, and treatment after [facility].  Clients were able to gain support on additional coping skills and how to identify additional warning signs.   Client reports no SI/HI and or AVH.  Each client was also asked  Name: Miranda Wood Date of Birth: 04-15-1976  MR: [id number]    Level of Participation: active Quality of Participation: cooperative, engaged, and offered feedback Interactions with others: gave feedback Mood/Affect: anxious, brightens with interaction, closed / guarded, and positive Triggers (if applicable): friends/substance use/current situation Cognition: coherent/clear and goal directed Progress: Moderate Response: engaged and supportive Plan: follow-up as needed  Patients Problems:  Patient Active Problem List   Diagnosis Date Noted   Alcohol abuse 11/29/2024   Alcohol use disorder, moderate, dependence (HCC) 11/28/2024   Neuropathy 01/13/2024   Hyponatremia 05/14/2021   Alcohol withdrawal (HCC) 05/11/2021   AKI (acute kidney injury) 05/11/2021   Hypokalemia 05/11/2021   Jaundice 05/11/2021   Prolonged QT interval 05/11/2021   Acute hyperactive alcohol withdrawal delirium (HCC) 12/31/2020    Hypomagnesemia 12/31/2020   Hyperbilirubinemia 12/31/2020   Infected sebaceous cyst of back 12/31/2020   Pyuria 12/31/2020   Seizure due to alcohol withdrawal (HCC) 12/30/2020   Nausea and vomiting 12/30/2020   Tobacco use 12/30/2020   Thrombocytopenia 12/30/2020   Seizure (HCC) 12/30/2020   Alcohol dependence with withdrawal with complication (HCC) 04/29/2018   Major depressive disorder, 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

## 2024-11-30 NOTE — Group Note (Signed)
 Group Topic: Recovery Basics  Group Date: 11/30/2024 Start Time: 1300 End Time: 1400 Facilitators: Nicholaus Arlyne BIRCH, NT  Department: [facility]  Number of Participants: 6  Group Focus: daily focus Treatment Modality:  Psychoeducation Interventions utilized were reality testing Purpose: explore maladaptive thinking  Name: Miranda Wood Date of Birth: 11/12/1975  MR: [id number]    Level of Participation: active Quality of Participation: cooperative Interactions with others: active listening Mood/Affect: appropriate Triggers (if applicable): None Cognition: insightful Progress: Gaining insight Response: positive Plan: patient will be encouraged to continue recovery plan  Patients Problems:  Patient Active Problem List   Diagnosis Date Noted   Alcohol abuse 11/29/2024   Alcohol use disorder, moderate, dependence (HCC) 11/28/2024   Neuropathy 01/13/2024   Hyponatremia 05/14/2021   Alcohol withdrawal (HCC) 05/11/2021   AKI (acute kidney injury) 05/11/2021   Hypokalemia 05/11/2021   Jaundice 05/11/2021   Prolonged QT interval 05/11/2021   Acute hyperactive alcohol withdrawal delirium (HCC) 12/31/2020   Hypomagnesemia 12/31/2020   Hyperbilirubinemia 12/31/2020   Infected sebaceous cyst of back 12/31/2020   Pyuria 12/31/2020   Seizure due to alcohol withdrawal (HCC) 12/30/2020   Nausea and vomiting 12/30/2020   Tobacco use 12/30/2020   Thrombocytopenia 12/30/2020   Seizure (HCC) 12/30/2020   Alcohol dependence with withdrawal with complication (HCC) 04/29/2018   Major depressive disorder, 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

## 2024-11-30 NOTE — ED Notes (Signed)
 Patient appears resting with equal and unlabored respirations. No appeared or reported distress. Monitor for safety.

## 2024-11-30 NOTE — ED Notes (Signed)
 Pt received at the beginning of the shift without complaints.  Affect flat mood pleasant.  Safety maintained.

## 2024-11-30 NOTE — Group Note (Signed)
 Group Topic: Fears and Unhealthy Coping Skills  Group Date: 11/30/2024 Start Time: 1200 End Time: 1230 Facilitators: Reinhold Minor BROCKS, RN  Department: [facility]  Number of Participants: 9  Group Focus: coping skills Treatment Modality:  Psychoeducation Interventions utilized were patient education Purpose: enhance coping skills  Name: Miranda Wood Date of Birth: 1975/12/19  MR: [id number]     Level of Participation: active Quality of Participation: attentive and cooperative Interactions with others: gave feedback Mood/Affect: appropriate Triggers (if applicable):  Cognition: coherent/clear Progress: Gaining insight Response: will utilize coping strategies more often Plan: patient will be encouraged to utilize coping skills  Patients Problems:  Patient Active Problem List   Diagnosis Date Noted   Alcohol abuse 11/29/2024   Alcohol use disorder, moderate, dependence (HCC) 11/28/2024   Neuropathy 01/13/2024   Hyponatremia 05/14/2021   Alcohol withdrawal (HCC) 05/11/2021   AKI (acute kidney injury) 05/11/2021   Hypokalemia 05/11/2021   Jaundice 05/11/2021   Prolonged QT interval 05/11/2021   Acute hyperactive alcohol withdrawal delirium (HCC) 12/31/2020   Hypomagnesemia 12/31/2020   Hyperbilirubinemia 12/31/2020   Infected sebaceous cyst of back 12/31/2020   Pyuria 12/31/2020   Seizure due to alcohol withdrawal (HCC) 12/30/2020   Nausea and vomiting 12/30/2020   Tobacco use 12/30/2020   Thrombocytopenia 12/30/2020   Seizure (HCC) 12/30/2020   Alcohol dependence with withdrawal with complication (HCC) 04/29/2018   Major depressive disorder, 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

## 2024-11-30 NOTE — Care Management (Addendum)
 [facility] Care Management...   Addendum 3:17 pm  Writer met with patient to confirm patient not wanting residential treatment.  Patient reported she would prefer doing out patient services. Patient reported involved and approved for housing with [facility]   Writer met with patient to discuss discharge planning...  Patient reported interested in residential treatment  Miranda Wood 49 year old female presents to the emergency department due to substance-induced mood disorder.  Reported history related to alcohol misuse disorder, major depressive disorder and generalized anxiety disorder.  She denied that she is currently followed by therapy or psychiatry services.  States she is seeking residential treatment.  Recently medically cleared at the local emergency department placed on phenobarbital  taper due to alcohol intake with withdrawal seizures and delirium tremens.  Reports drinking 1/5 of liquor daily. Patient reported having 5 kids, twins 81 y/o, 16 y/o 53 and 49 y/o, younger kids living with older sibilings   Patient reported recently being released from jail. Patient reported no current charges, court dates or on probation.  Patient reports currently homeless    Writer sent referrals to [facility] and [facility]

## 2024-11-30 NOTE — Group Note (Signed)
 Group Topic: Communication  Group Date: 11/30/2024 Start Time: 2030 End Time: 2124 Facilitators: Luvenia Mae SAUNDERS, NT  Department: [facility]  Number of Participants: 6  Group Focus: communication and healthy friendships Treatment Modality:  Leisure Development Interventions utilized were group exercise and story telling Purpose: express feelings  Name: Miranda Wood Date of Birth: Apr 16, 1976  MR: [id number]    Level of Participation: active Quality of Participation: attentive Interactions with others: gave feedback Mood/Affect: appropriate Triggers (if applicable): none Cognition: coherent/clear Progress: Gaining insight Response: none Plan: patient will be encouraged to continue with group  Patients Problems:  Patient Active Problem List   Diagnosis Date Noted   Alcohol abuse 11/29/2024   Alcohol use disorder, moderate, dependence (HCC) 11/28/2024   Neuropathy 01/13/2024   Hyponatremia 05/14/2021   Alcohol withdrawal (HCC) 05/11/2021   AKI (acute kidney injury) 05/11/2021   Hypokalemia 05/11/2021   Jaundice 05/11/2021   Prolonged QT interval 05/11/2021   Acute hyperactive alcohol withdrawal delirium (HCC) 12/31/2020   Hypomagnesemia 12/31/2020   Hyperbilirubinemia 12/31/2020   Infected sebaceous cyst of back 12/31/2020   Pyuria 12/31/2020   Seizure due to alcohol withdrawal (HCC) 12/30/2020   Nausea and vomiting 12/30/2020   Tobacco use 12/30/2020   Thrombocytopenia 12/30/2020   Seizure (HCC) 12/30/2020   Alcohol dependence with withdrawal with complication (HCC) 04/29/2018   Major depressive disorder, 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

## 2024-12-01 MED ORDER — VITAMIN D (ERGOCALCIFEROL) 1.25 MG (50000 UNIT) PO CAPS
50000.0000 [IU] | ORAL_CAPSULE | ORAL | Status: AC
  Administered 2024-12-02: 50000 [IU] via ORAL
  Filled 2024-12-01: qty 1

## 2024-12-01 NOTE — Group Note (Signed)
 Group Topic: Wellness  Group Date: 12/01/2024 Start Time: 1200 End Time: 1230 Facilitators: Daved Tinnie HERO, RN  Department: [facility]  Number of Participants: 8  Group Focus: psychiatric education Treatment Modality:  Psychoeducation Interventions utilized were patient education Purpose: increase insight  Name: Miranda Wood Date of Birth: 12-25-1975  MR: [id number]    Level of Participation: moderate Quality of Participation: attentive Interactions with others: gave feedback Mood/Affect: appropriate Triggers (if applicable): n/a Cognition: coherent/clear Progress: Gaining insight Response: medications reviewed, pt expressed understanding  Plan: patient will be encouraged to attend future RN education groups  Patients Problems:  Patient Active Problem List   Diagnosis Date Noted   Alcohol abuse 11/29/2024   Alcohol use disorder, moderate, dependence (HCC) 11/28/2024   Neuropathy 01/13/2024   Hyponatremia 05/14/2021   Alcohol withdrawal (HCC) 05/11/2021   AKI (acute kidney injury) 05/11/2021   Hypokalemia 05/11/2021   Jaundice 05/11/2021   Prolonged QT interval 05/11/2021   Acute hyperactive alcohol withdrawal delirium (HCC) 12/31/2020   Hypomagnesemia 12/31/2020   Hyperbilirubinemia 12/31/2020   Infected sebaceous cyst of back 12/31/2020   Pyuria 12/31/2020   Seizure due to alcohol withdrawal (HCC) 12/30/2020   Nausea and vomiting 12/30/2020   Tobacco use 12/30/2020   Thrombocytopenia 12/30/2020   Seizure (HCC) 12/30/2020   Alcohol dependence with withdrawal with complication (HCC) 04/29/2018   Major depressive disorder, 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

## 2024-12-01 NOTE — Care Management (Addendum)
 [facility] Care Management...  Addendum 2:59 pm  Per [facility] female residents need to have 60 days sobriety before they can get into their housing program  Writer provided patient with contact number for [facility] [phone]  No referral to be sent patient completes phone assessment and decision made in 24 - 48 hours  Addendum 1:39 pm  Writer spoke with [facility], referral not received.  Writer faxed referral to [phone]   Writer spoke with Debbie with [facility] advised that patient has been approved for housing however, she would need find a job and be able to pay rent after housing program ends. Per Marval in order for patient to succeed she should go into a residential program. Marval reported that ha had previous contact with [facility] for patient to get into treatment there. Debbie advised that if patient goes into treatment she will not lose her housing, file will not be closed out

## 2024-12-01 NOTE — ED Notes (Signed)
 Patient denies pain and is resting comfortably.

## 2024-12-01 NOTE — Care Management (Signed)
 [facility] Care Management...  Writer called [facility] to confirm availability.   [facility] reports openings for female beds.  Writer will send over referral via email.

## 2024-12-01 NOTE — Group Note (Signed)
 Group Topic: Identity and Relationships  Group Date: 12/01/2024 Start Time: 1415 End Time: 1500 Facilitators: Alyse Leilani LABOR, NT  Department: [facility]  Number of Participants: 7  Group Focus: acceptance, clarity of thought, and coping skills Treatment Modality:  Psychoeducation Interventions utilized were problem solving Purpose: express feelings  Name: Miranda Wood Date of Birth: 10-May-1976  MR: [id number]    Level of Participation: active Quality of Participation: cooperative and engaged Interactions with others: gave feedback Mood/Affect: bright and positive Triggers (if applicable): none Cognition: concrete and insightful Progress: Gaining insight Response: Had fun Plan: patient will be encouraged to keep coming to group  Patients Problems:  Patient Active Problem List   Diagnosis Date Noted   Alcohol abuse 11/29/2024   Alcohol use disorder, moderate, dependence (HCC) 11/28/2024   Neuropathy 01/13/2024   Hyponatremia 05/14/2021   Alcohol withdrawal (HCC) 05/11/2021   AKI (acute kidney injury) 05/11/2021   Hypokalemia 05/11/2021   Jaundice 05/11/2021   Prolonged QT interval 05/11/2021   Acute hyperactive alcohol withdrawal delirium (HCC) 12/31/2020   Hypomagnesemia 12/31/2020   Hyperbilirubinemia 12/31/2020   Infected sebaceous cyst of back 12/31/2020   Pyuria 12/31/2020   Seizure due to alcohol withdrawal (HCC) 12/30/2020   Nausea and vomiting 12/30/2020   Tobacco use 12/30/2020   Thrombocytopenia 12/30/2020   Seizure (HCC) 12/30/2020   Alcohol dependence with withdrawal with complication (HCC) 04/29/2018   Major depressive disorder, 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

## 2024-12-01 NOTE — ED Notes (Signed)
 Pt is having some difficulty with injured left shoulder.. Needs anticipated and comfort promoted with pain control per providers orders. Pt is Not wearing her sling.  Pt is ruddy colored . C/o headache scoring  7.  Maintained on CIWA protocol.  Pt on CIWA taper.  Pt is pleasant and verbalizes with appreciation.  Safety maintained.

## 2024-12-01 NOTE — Group Note (Signed)
 Group Topic: Healthy Self Image and Positive Change  Group Date: 12/01/2024 Start Time: 2030 End Time: 2100 Facilitators: Anice Benton LABOR, NT  Department: [facility]  Number of Participants: 5  Group Focus: clarity of thought, feeling awareness/expression, goals/reality orientation, and self-awareness Treatment Modality:  Patient-Centered Therapy and Solution-Focused Therapy Interventions utilized were group exercise and support Purpose: enhance coping skills, increase insight, and regain self-worth  Name: Miranda Wood Date of Birth: 03/18/1976  MR: [id number]    Level of Participation: active Quality of Participation: attentive and cooperative Interactions with others: gave feedback Mood/Affect: appropriate and positive Triggers (if applicable): N/A Cognition: coherent/clear and goal directed Progress: Moderate Response: N/A Plan: follow-up needed  Patients Problems:  Patient Active Problem List   Diagnosis Date Noted   Alcohol abuse 11/29/2024   Alcohol use disorder, moderate, dependence (HCC) 11/28/2024   Neuropathy 01/13/2024   Hyponatremia 05/14/2021   Alcohol withdrawal (HCC) 05/11/2021   AKI (acute kidney injury) 05/11/2021   Hypokalemia 05/11/2021   Jaundice 05/11/2021   Prolonged QT interval 05/11/2021   Acute hyperactive alcohol withdrawal delirium (HCC) 12/31/2020   Hypomagnesemia 12/31/2020   Hyperbilirubinemia 12/31/2020   Infected sebaceous cyst of back 12/31/2020   Pyuria 12/31/2020   Seizure due to alcohol withdrawal (HCC) 12/30/2020   Nausea and vomiting 12/30/2020   Tobacco use 12/30/2020   Thrombocytopenia 12/30/2020   Seizure (HCC) 12/30/2020   Alcohol dependence with withdrawal with complication (HCC) 04/29/2018   Major depressive disorder, 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   Substance induced mood disorder (HCC)    Substance-induced anxiety disorder (HCC)    Alcohol withdrawal  without perceptual disturbances with complication [facility])    Cesarean delivery delivered 07/10/2011

## 2024-12-01 NOTE — ED Notes (Signed)
 Pt presents as pleasant and somewhat depressed. Pt denies si hi and avh, verbal contract for safety provided. Medications reviewed, questions denied. Pt c/o L collar shoulder and leg pain, prn advil  administered. Pt reports to have eaten breakfast, currently having snack in dayroom. No signs of distress observed.

## 2024-12-02 NOTE — Group Note (Signed)
 Group Topic: Relaxation  Group Date: 12/02/2024 Start Time: 1200 End Time: 1230 Facilitators: Reinhold Minor BROCKS, RN  Department: [facility]  Number of Participants: 5  Group Focus: coping skills Treatment Modality:  Psychoeducation Interventions utilized were patient education Purpose: enhance coping skills and reinforce self-care  Name: Miranda Wood Date of Birth: Jul 28, 1976  MR: [id number]    Level of Participation: minimal Quality of Participation: cooperative Interactions with others: gave feedback Mood/Affect: positive Triggers (if applicable):  Cognition: coherent/clear Progress: Minimal Response:  continue to practice coping skills Plan: patient will be encouraged to continue to practice coping skills  Patients Problems:  Patient Active Problem List   Diagnosis Date Noted   Alcohol abuse 11/29/2024   Alcohol use disorder, moderate, dependence (HCC) 11/28/2024   Neuropathy 01/13/2024   Hyponatremia 05/14/2021   Alcohol withdrawal (HCC) 05/11/2021   AKI (acute kidney injury) 05/11/2021   Hypokalemia 05/11/2021   Jaundice 05/11/2021   Prolonged QT interval 05/11/2021   Acute hyperactive alcohol withdrawal delirium (HCC) 12/31/2020   Hypomagnesemia 12/31/2020   Hyperbilirubinemia 12/31/2020   Infected sebaceous cyst of back 12/31/2020   Pyuria 12/31/2020   Seizure due to alcohol withdrawal (HCC) 12/30/2020   Nausea and vomiting 12/30/2020   Tobacco use 12/30/2020   Thrombocytopenia 12/30/2020   Seizure (HCC) 12/30/2020   Alcohol dependence with withdrawal with complication (HCC) 04/29/2018   Major depressive disorder, 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

## 2024-12-02 NOTE — ED Notes (Signed)
 Pt sleeping long periods through night in no apparent distress. Safety maintained.

## 2024-12-02 NOTE — ED Notes (Signed)
 Pt received at the beginning of the shift, calm and cooperative offering good eye contact.  Pt denies withdrawal symptoms on CIWA protocol.  C/ o painful left shoulder.  Pain controll given v for comfort. Safety maintained.

## 2024-12-02 NOTE — Group Note (Signed)
 Group Topic: Relapse and Recovery  Group Date: 12/02/2024 Start Time: 2030 End Time: 2100 Facilitators: Anice Benton LABOR, NT  Department: [facility]  Number of Participants: 6  Group Focus: abuse issues, feeling awareness/expression, and relapse prevention(AA Group) Treatment Modality:  Patient-Centered Therapy and Solution-Focused Therapy Interventions utilized were group exercise and support Purpose: enhance coping skills, express feelings, and relapse prevention strategies  Name: Miranda Wood Date of Birth: 1976-07-10  MR: [id number]    Level of Participation: active Quality of Participation: cooperative Interactions with others: gave feedback Mood/Affect: appropriate Triggers (if applicable): N/A Cognition: coherent/clear Progress: Moderate Response: Good Plan: follow-up needed  Patients Problems:  Patient Active Problem List   Diagnosis Date Noted   Alcohol abuse 11/29/2024   Alcohol use disorder, moderate, dependence (HCC) 11/28/2024   Neuropathy 01/13/2024   Hyponatremia 05/14/2021   Alcohol withdrawal (HCC) 05/11/2021   AKI (acute kidney injury) 05/11/2021   Hypokalemia 05/11/2021   Jaundice 05/11/2021   Prolonged QT interval 05/11/2021   Acute hyperactive alcohol withdrawal delirium (HCC) 12/31/2020   Hypomagnesemia 12/31/2020   Hyperbilirubinemia 12/31/2020   Infected sebaceous cyst of back 12/31/2020   Pyuria 12/31/2020   Seizure due to alcohol withdrawal (HCC) 12/30/2020   Nausea and vomiting 12/30/2020   Tobacco use 12/30/2020   Thrombocytopenia 12/30/2020   Seizure (HCC) 12/30/2020   Alcohol dependence with withdrawal with complication (HCC) 04/29/2018   Major depressive disorder, 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)    Cesarean  delivery delivered 07/10/2011

## 2024-12-02 NOTE — ED Provider Notes (Signed)
 Behavioral Health Progress Note  Date and Time: 12/02/2024 8:17 AM Name: Miranda Wood MRN:  [id number]  Subjective:  Patient states I am fine with changing my people, places and things. I would like to stay in [place] because of my kids but I am open to other places.  Miranda Wood is a 49 year old female who presented voluntarily to [facility] emergency department on 11/28/2024.  Psychiatric history includes MDD, substance-induced mood disorder, alcohol use disorder,  and substance induced anxiety disorder.  Patient transferred to [facility] behavioral health [facility] on 11/29/2024.  Patient request alcohol detox upon arrival.  Patient endorses history of alcohol related seizure (3 episodes) and delirium tremens.  Prior to arrival patient drinking up to 1/5 of liquor daily. Most recent residential substance use treatment at age 45.   Chart reviewed and patient assessed by this nurse practitioner face-to-face on 12/02/2024.  Patient is seated in dining area eating breakfast upon my approach.  She is alert and oriented, pleasant and cooperative during assessment.  Patient presents with euthymic mood, congruent affect.  Patient reports pain to left clavicle after recent fracture gradually improving.  She is not wearing her sling currently.  The sling becomes bothersome she takes it off several times during the day per her report.  Pain to left clavicle measures 4 today.  Discussed plan to discontinue hydrocodone -acetaminophen , patient aware of need to ask for as needed medications including ibuprofen  moving forward.  Hydrocodone  medications not accepted to [facility] treatment facility [facility], patient currently under review.  Miranda Wood reports alcohol withdrawal symptoms getting better.  Intermittent tremors and headache along with hot flashes continue.  Patient denies cravings or urges to use alcohol.  Average sleep and appetite.  Patient identifies  energy level today as okay.  She is positive for groups.  Appropriate and engaged with staff and peers.  She is compliant with medications.  Phenobarbital  taper to end today.  Miranda Wood continues to deny SI/HI/AVH.  There is no evidence of delusional thought content no indication the patient is responding to internal stimuli.  Patient offered support and encouragement.  She agrees with treatment plan to include residential substance use treatment once discharged from [facility] behavioral health urgent care, facility based crisis.    Diagnosis:  Final diagnoses:  Substance induced mood disorder (HCC)  Alcohol withdrawal syndrome without complication (HCC)  Vitamin D  deficiency    Total Time spent with patient: 20 minutes Family History: Brother: alcohol use disorder. Maternal grandmother: alcohol use disorder.    Family Psychiatric  History: Brother: alcohol use disorder. Maternal grandmother: alcohol use disorder. Biological mother: alcohol use disorder. Social History: Miranda Wood is currently sleeping on couches of friends or family members or residing in a tent. 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 She denies access to weapons.   Additional Social History:                         Sleep: Good  Appetite:  Good  Current Medications:  Current Facility-Administered Medications  Medication Dose Route Frequency Provider Last Rate Last Admin   acetaminophen  (TYLENOL ) tablet 650 mg  650 mg Oral Q6H PRN Motley-Mangrum, Jadeka A, PMHNP   650 mg at 11/30/24 0941   alum & mag hydroxide-simeth (MAALOX/MYLANTA) 200-200-20 MG/5ML suspension 30 mL  30 mL Oral Q4H PRN Motley-Mangrum, Jadeka A, PMHNP       HYDROcodone -acetaminophen  (NORCO/VICODIN) 5-325 MG per tablet 1 tablet  1 tablet Oral  Q6H PRN Motley-Mangrum, Jadeka A, PMHNP   1 tablet at 12/01/24 2137   ibuprofen  (ADVIL ) tablet 600 mg  600 mg Oral Q8H PRN Motley-Mangrum, Jadeka A, PMHNP   600 mg at  12/02/24 0701   magnesium  hydroxide (MILK OF MAGNESIA) suspension 30 mL  30 mL Oral Daily PRN Motley-Mangrum, Jadeka A, PMHNP       OLANZapine  (ZYPREXA ) injection 10 mg  10 mg Intramuscular TID PRN Motley-Mangrum, Jadeka A, PMHNP       OLANZapine  (ZYPREXA ) injection 5 mg  5 mg Intramuscular TID PRN Motley-Mangrum, Jadeka A, PMHNP       OLANZapine  zydis (ZYPREXA ) disintegrating tablet 5 mg  5 mg Oral TID PRN Motley-Mangrum, Jadeka A, PMHNP   5 mg at 12/01/24 2136   PHENobarbital  (LUMINAL) tablet 64.8 mg  64.8 mg Oral Q8H Motley-Mangrum, Jadeka A, PMHNP   64.8 mg at 12/01/24 2356   Followed by   PHENobarbital  (LUMINAL) tablet 32.4 mg  32.4 mg Oral Q8H Motley-Mangrum, Jadeka A, PMHNP       thiamine  (VITAMIN B1) tablet 100 mg  100 mg Oral Daily Motley-Mangrum, Jadeka A, PMHNP   100 mg at 12/01/24 0935   Or   thiamine  (VITAMIN B1) injection 100 mg  100 mg Intravenous Daily Motley-Mangrum, Jadeka A, PMHNP   100 mg at 11/30/24 0946   Vitamin D  (Ergocalciferol ) (DRISDOL ) 1.25 MG (50000 UNIT) capsule 50,000 Units  50,000 Units Oral Q7 days Dasie Miranda CROME, FNP       Current Outpatient Medications  Medication Sig Dispense Refill   acetaminophen  (TYLENOL ) 325 MG tablet Take 650 mg by mouth every 6 (six) hours as needed for moderate pain (pain score 4-6).      Labs  Lab Results:  Admission on 11/29/2024  Component Date Value Ref Range Status   Vitamin B-12 11/30/2024 546  180 - 914 pg/mL Final   Performed at [facility], [address], [place], [place] [address]   Folate 11/30/2024 6.4  >5.9 ng/mL Final   Performed at [facility], [address] [place], [place] [address]   Vit D, 25-Hydroxy 11/30/2024 13.4 (L)  30 - 100 ng/mL Final   Comment: (NOTE) Vitamin D  deficiency has been defined by the Institute of Medicine  and an Endocrine Society practice guideline as a level of serum 25-OH  vitamin D  less than 20 ng/mL (1,2). The Endocrine Society went on to  further define vitamin D   insufficiency as a level between 21 and 29  ng/mL (2).  1. IOM (Institute of Medicine). 2010. Dietary reference intakes for  calcium and D. [place]: [employer]. 2. Holick MF, Binkley Turin, Bischoff-Ferrari HA, et al. Evaluation,  treatment, and prevention of vitamin D  deficiency: an Endocrine  Society clinical practice guideline, JCEM. 2011 Jul; 96(7): 1911-30.  Performed at [facility], [address], [place], [place] [address]   Admission on 11/27/2024, Discharged on 11/29/2024  Component Date Value Ref Range Status   WBC 11/28/2024 5.5  4.0 - 10.5 K/uL Final   RBC 11/28/2024 3.06 (L)  3.87 - 5.11 MIL/uL Final   Hemoglobin 11/28/2024 11.4 (L)  12.0 - 15.0 g/dL Final   HCT 98/68/7973 33.1 (L)  36.0 - 46.0 % Final   MCV 11/28/2024 108.2 (H)  80.0 - 100.0 fL Final   MCH 11/28/2024 37.3 (H)  26.0 - 34.0 pg Final   MCHC 11/28/2024 34.4  30.0 - 36.0 g/dL Final   RDW 98/68/7973 14.1  11.5 - 15.5 % Final   Platelets 11/28/2024 178  150 - 400 K/uL Final   nRBC 11/28/2024 0.0  0.0 - 0.2 % Final   Neutrophils Relative % 11/28/2024 47  % Final   Neutro Abs 11/28/2024 2.6  1.7 - 7.7 K/uL Final   Lymphocytes Relative 11/28/2024 43  % Final   Lymphs Abs 11/28/2024 2.4  0.7 - 4.0 K/uL Final   Monocytes Relative 11/28/2024 8  % Final   Monocytes Absolute 11/28/2024 0.4  0.1 - 1.0 K/uL Final   Eosinophils Relative 11/28/2024 1  % Final   Eosinophils Absolute 11/28/2024 0.1  0.0 - 0.5 K/uL Final   Basophils Relative 11/28/2024 1  % Final   Basophils Absolute 11/28/2024 0.1  0.0 - 0.1 K/uL Final   Immature Granulocytes 11/28/2024 0  % Final   Abs Immature Granulocytes 11/28/2024 0.02  0.00 - 0.07 K/uL Final   Performed at [facility], [address], [place], [place] [address]   Sodium 11/28/2024 137  135 - 145 mmol/L Final   Potassium 11/28/2024 3.8  3.5 - 5.1 mmol/L Final   Chloride 11/28/2024 102  98 - 111 mmol/L Final   CO2 11/28/2024 21 (L)  22 - 32  mmol/L Final   Glucose, Bld 11/28/2024 78  70 - 99 mg/dL Final   Glucose reference range applies only to samples taken after fasting for at least 8 hours.   BUN 11/28/2024 9  6 - 20 mg/dL Final   Creatinine, Ser 11/28/2024 0.48  0.44 - 1.00 mg/dL Final   Calcium 98/68/7973 8.1 (L)  8.9 - 10.3 mg/dL Final   Total Protein 98/68/7973 6.4 (L)  6.5 - 8.1 g/dL Final   Albumin 98/68/7973 3.4 (L)  3.5 - 5.0 g/dL Final   AST 98/68/7973 89 (H)  15 - 41 U/L Final   ALT 11/28/2024 24  0 - 44 U/L Final   Alkaline Phosphatase 11/28/2024 92  38 - 126 U/L Final   Total Bilirubin 11/28/2024 0.5  0.0 - 1.2 mg/dL Final   GFR, Estimated 11/28/2024 >60  >60 mL/min Final   Comment: (NOTE) Calculated using the CKD-EPI Creatinine Equation (2021)    Anion gap 11/28/2024 14  5 - 15 Final   Performed at [facility], [address], [place], [place] [address]   Alcohol, Ethyl (B) 11/28/2024 96 (H)  <15 mg/dL Final   Comment: (NOTE) For medical purposes only. Performed at [facility], [address], [place], [place] [address]    Preg, Serum 11/28/2024 NEGATIVE  NEGATIVE Final   Comment:        THE SENSITIVITY OF THIS METHODOLOGY IS >10 mIU/mL. Performed at [facility], [address], [place], [place] [address]    Magnesium  11/28/2024 1.6 (L)  1.7 - 2.4 mg/dL Final   Performed at [facility], [address], [place], [place] [address]   Opiates 11/28/2024 NEGATIVE  NEGATIVE Final   Cocaine 11/28/2024 NEGATIVE  NEGATIVE Final   Benzodiazepines 11/28/2024 POSITIVE (A)  NEGATIVE Final   Amphetamines 11/28/2024 NEGATIVE  NEGATIVE Final   Tetrahydrocannabinol 11/28/2024 NEGATIVE  NEGATIVE Final   Barbiturates 11/28/2024 POSITIVE (A)  NEGATIVE Final   Methadone Scn, Ur 11/28/2024 NEGATIVE  NEGATIVE Final   Fentanyl  11/28/2024 NEGATIVE  NEGATIVE Final   Comment: (NOTE) Drug screen is for Medical Purposes only. Positive results are preliminary only. If confirmation is needed, notify  lab within 5 days.  Drug Class  Cutoff (ng/mL) Amphetamine and metabolites 1000 Barbiturate and metabolites 200 Benzodiazepine              200 Opiates and metabolites     300 Cocaine and metabolites     300 THC                         50 Fentanyl                     5 Methadone                   300  Trazodone is metabolized in vivo to several metabolites,  including pharmacologically active m-CPP, which is excreted in the  urine.  Immunoassay screens for amphetamines and MDMA have potential  cross-reactivity with these compounds and may provide false positive  result.  Performed at [facility], [address], [place], [place] [address]    Magnesium  11/28/2024 2.0  1.7 - 2.4 mg/dL Final   Performed at [facility], [address] [place], [place] [address]  Admission on 06/04/2024, Discharged on 06/04/2024  Component Date Value Ref Range Status   Sodium 06/04/2024 141  135 - 145 mmol/L Final   Potassium 06/04/2024 3.3 (L)  3.5 - 5.1 mmol/L Final   Chloride 06/04/2024 99  98 - 111 mmol/L Final   CO2 06/04/2024 29  22 - 32 mmol/L Final   Glucose, Bld 06/04/2024 97  70 - 99 mg/dL Final   Glucose reference range applies only to samples taken after fasting for at least 8 hours.   BUN 06/04/2024 6  6 - 20 mg/dL Final   Creatinine, Ser 06/04/2024 0.61  0.44 - 1.00 mg/dL Final   Calcium 91/92/7974 8.4 (L)  8.9 - 10.3 mg/dL Final   Total Protein 91/92/7974 7.6  6.5 - 8.1 g/dL Final   Albumin 91/92/7974 3.6  3.5 - 5.0 g/dL Final   AST 91/92/7974 248 (H)  15 - 41 U/L Final   ALT 06/04/2024 43  0 - 44 U/L Final   Alkaline Phosphatase 06/04/2024 89  38 - 126 U/L Final   Total Bilirubin 06/04/2024 0.8  0.0 - 1.2 mg/dL Final   GFR, Estimated 06/04/2024 >60  >60 mL/min Final   Comment: (NOTE) Calculated using the CKD-EPI Creatinine Equation (2021)    Anion gap 06/04/2024 13  5 - 15 Final   Performed at [facility], [address],  [place], [place] [address]   Alcohol, Ethyl (B) 06/04/2024 497 (HH)  <15 mg/dL Final   Comment: CRITICAL RESULT CALLED TO, READ BACK BY AND VERIFIED WITH FOX,B. RN AT 1440 06/04/24 MULLINS,T (NOTE) For medical purposes only. Performed at [facility], [address], [place], [place] [address]    WBC 06/04/2024 4.6  4.0 - 10.5 K/uL Final   RBC 06/04/2024 3.37 (L)  3.87 - 5.11 MIL/uL Final   Hemoglobin 06/04/2024 12.0  12.0 - 15.0 g/dL Final   HCT 91/92/7974 36.7  36.0 - 46.0 % Final   MCV 06/04/2024 108.9 (H)  80.0 - 100.0 fL Final   MCH 06/04/2024 35.6 (H)  26.0 - 34.0 pg Final   MCHC 06/04/2024 32.7  30.0 - 36.0 g/dL Final   RDW 91/92/7974 13.2  11.5 - 15.5 % Final   Platelets 06/04/2024 104 (L)  150 - 400 K/uL Final   nRBC 06/04/2024 0.0  0.0 - 0.2 % Final   Neutrophils  Relative % 06/04/2024 46  % Final   Neutro Abs 06/04/2024 2.1  1.7 - 7.7 K/uL Final   Lymphocytes Relative 06/04/2024 40  % Final   Lymphs Abs 06/04/2024 1.8  0.7 - 4.0 K/uL Final   Monocytes Relative 06/04/2024 9  % Final   Monocytes Absolute 06/04/2024 0.4  0.1 - 1.0 K/uL Final   Eosinophils Relative 06/04/2024 2  % Final   Eosinophils Absolute 06/04/2024 0.1  0.0 - 0.5 K/uL Final   Basophils Relative 06/04/2024 2  % Final   Basophils Absolute 06/04/2024 0.1  0.0 - 0.1 K/uL Final   Immature Granulocytes 06/04/2024 1  % Final   Abs Immature Granulocytes 06/04/2024 0.06  0.00 - 0.07 K/uL Final   Performed at [facility], [address], [place], [place] [address]   Prothrombin Time 06/04/2024 13.2  11.4 - 15.2 seconds Final   INR 06/04/2024 0.9  0.8 - 1.2 Final   Comment: (NOTE) INR goal varies based on device and disease states. Performed at [facility], [address], [place], [place] [address]    Preg, Serum 06/04/2024 NEGATIVE  NEGATIVE Final   Comment:        THE SENSITIVITY OF THIS METHODOLOGY IS >10 mIU/mL. Performed at [facility], [address], [place], [place] [address]    Color, Urine 06/04/2024 YELLOW  YELLOW Final   APPearance 06/04/2024 CLEAR  CLEAR Final   Specific Gravity, Urine 06/04/2024 1.008  1.005 - 1.030 Final   pH 06/04/2024 6.0  5.0 - 8.0 Final   Glucose, UA 06/04/2024 NEGATIVE  NEGATIVE mg/dL Final   Hgb urine dipstick 06/04/2024 NEGATIVE  NEGATIVE Final   Bilirubin Urine 06/04/2024 NEGATIVE  NEGATIVE Final   Ketones, ur 06/04/2024 NEGATIVE  NEGATIVE mg/dL Final   Protein, ur 91/92/7974 NEGATIVE  NEGATIVE mg/dL Final   Nitrite 91/92/7974 POSITIVE (A)  NEGATIVE Final   Leukocytes,Ua 06/04/2024 TRACE (A)  NEGATIVE Final   RBC / HPF 06/04/2024 0-5  0 - 5 RBC/hpf Final   WBC, UA 06/04/2024 0-5  0 - 5 WBC/hpf Final   Bacteria, UA 06/04/2024 RARE (A)  NONE SEEN Final   Squamous Epithelial / HPF 06/04/2024 0-5  0 - 5 /HPF Final   Mucus 06/04/2024 PRESENT   Final   Amorphous Crystal 06/04/2024 PRESENT   Final   Performed at [facility], [address] [place], [place] [address]   Opiates 06/04/2024 NONE DETECTED  NONE DETECTED Final   Cocaine 06/04/2024 NONE DETECTED  NONE DETECTED Final   Benzodiazepines 06/04/2024 NONE DETECTED  NONE DETECTED Final   Amphetamines 06/04/2024 NONE DETECTED  NONE DETECTED Final   Tetrahydrocannabinol 06/04/2024 NONE DETECTED  NONE DETECTED Final   Barbiturates 06/04/2024 NONE DETECTED  NONE DETECTED Final   Comment: (NOTE) DRUG SCREEN FOR MEDICAL PURPOSES ONLY.  IF CONFIRMATION IS NEEDED FOR ANY PURPOSE, NOTIFY LAB WITHIN 5 DAYS.  LOWEST DETECTABLE LIMITS FOR URINE DRUG SCREEN Drug Class                     Cutoff (ng/mL) Amphetamine and metabolites    1000 Barbiturate and metabolites    200 Benzodiazepine                 200 Opiates and metabolites        300 Cocaine and metabolites        300 THC  50 Performed at [facility], [address] [place], [place] [address]      Blood Alcohol level:  Lab Results  Component Value Date   ETH 96 (H) 11/28/2024   ETH 497 (HH) 06/04/2024    Metabolic Disorder Labs: No results found for: HGBA1C, MPG No results found for: PROLACTIN No results found for: CHOL, TRIG, HDL, CHOLHDL, VLDL, LDLCALC  Therapeutic Lab Levels: No results found for: LITHIUM No results found for: VALPROATE No results found for: CBMZ  Physical Findings   AIMS    Flowsheet Row [facility]  AIMS Total Score 0   AUDIT    Flowsheet Row [facility]  Alcohol Use Disorder Identification Test Final Score (AUDIT) 18   PHQ2-9    Flowsheet Row ED from 11/29/2024 in [facility]  PHQ-2 Total Score 0  PHQ-9 Total Score 13   Flowsheet Row ED from 11/29/2024 in [facility] ED from 11/27/2024 in [facility] Department at [facility] ED from 06/04/2024 in [facility] Department at [facility]  C-SSRS RISK CATEGORY No Risk No Risk No Risk     Musculoskeletal  Strength & Muscle Tone: within normal limits Gait & Station: normal Patient leans: N/A  Psychiatric Specialty Exam  Presentation  General Appearance:  Appropriate for Environment; Casual  Eye Contact: Good  Speech: Clear and Coherent; Normal Rate  Speech Volume: Normal  Handedness: Right   Mood and Affect  Mood: Euthymic  Affect: Appropriate; Congruent   Thought Process  Thought Processes: Coherent; Goal Directed; Linear  Descriptions of Associations:Intact  Orientation:Full (Time, Place and Person)  Thought Content:Logical; WDL     Hallucinations:Hallucinations: None  Ideas of Reference:None  Suicidal Thoughts:Suicidal Thoughts: No  Homicidal Thoughts:Homicidal Thoughts: No   Sensorium   Memory: Immediate Good; Recent Fair  Judgment: Fair  Insight: Fair   Art Therapist  Concentration: Good  Attention Span: Good  Recall: Good  Fund of Knowledge: Good  Language: Good   Psychomotor Activity  Psychomotor Activity: Psychomotor Activity: Normal   Assets  Assets: Communication Skills; Desire for Improvement; Social Support   Sleep  Sleep: Sleep: Good  Estimated Sleeping Duration (Last 24 Hours): 10.00-10.25 hours  No data recorded  Physical Exam  Physical Exam Vitals and nursing note reviewed.  Constitutional:      Appearance: Normal appearance. She is well-developed.  HENT:     Head: Normocephalic and atraumatic.     Nose: Nose normal.  Cardiovascular:     Rate and Rhythm: Normal rate.  Pulmonary:     Effort: Pulmonary effort is normal.  Musculoskeletal:        General: Normal range of motion.     Cervical back: Normal range of motion.  Skin:    General: Skin is warm and dry.  Neurological:     Mental Status: She is alert and oriented to person, place, and time.    Review of Systems  Constitutional: Negative.   HENT: Negative.    Eyes: Negative.   Respiratory: Negative.    Cardiovascular: Negative.   Gastrointestinal: Negative.   Genitourinary: Negative.   Musculoskeletal: Negative.   Skin: Negative.   Neurological: Negative.   Psychiatric/Behavioral: Negative.     Blood pressure 108/66, pulse 81, temperature 98.5 F (36.9 C), temperature source Oral, resp. rate 16, SpO2 99%. There is no height or  weight on file to calculate BMI.  Treatment Plan Summary: Continued admission at [facility] behavioral health facility based crisis unit.  Patient remains voluntary. Daily contact with patient to assess and evaluate symptoms and progress in treatment  Labs Reviewed: No new lab orders placed.  EKG from 11/28/2024 (baseline) with NSR QTc measures .     Continue current medications: -Phenobarbital  taper  continues -Acetaminophen  650 mg every 6 hours, as needed/mild pain -Maalox 30 mL oral every 4 hours, as needed/digestion -Ibuprofen  600 mg every 8 hours as needed/mild pain -Magnesium  hydroxide 30 mL daily as needed/mild constipation - Thiamine  B1 100 mg oral daily -Vitamin D  (ergocalciferol ) 1.25 mg (50,000 unit) oral q. 7 days/vitamin D  deficiency   -Agitation protocol   Discharge Planning: Miranda Wood is under review [facility] [place]. Disposition/case management to assist with discharge planning and identification of follow-up needs including residential or outpatient substance use treatment options as needed, prior to discharge Discharge Concerns: Need to confirm safety plan; Medication compliance and effectiveness Discharge Goals: Return home with outpatient referrals for mental health follow-up including medication management/psychotherapy and substance use treatment follow-up resources as appropriate  Note: This document was prepared using Dragon voice recognition software and may include unintentional dictation errors.  Miranda LITTIE Dawn, FNP 12/02/2024 8:17 AM

## 2024-12-02 NOTE — Group Note (Signed)
 Group Topic: Relapse and Recovery  Group Date: 12/01/2024 Start Time: 1100 End Time: 1150 Facilitators: Gerome Jolly, NT  Department: [facility]  Number of Participants: 6  Group Focus: acceptance, chemical dependency education, goals/reality orientation, healthy friendships, relapse prevention, and substance abuse education Treatment Modality:  Cognitive Behavioral Therapy and Psychoeducation Interventions utilized were confrontation, exploration, story telling, and support Purpose: Facilitator provided patients with literature from the NA Just For Today Meditation for February 3rd reading We need each other. This was used to focus on the similarities of each of us  and not the differences, explore maladaptive thinking, express irrational fears, and relapse prevention strategies. Facilitator ended the meeting with NA reading We do Recover  Name: Miranda Wood Date of Birth: [birth year]  MR: [id number]    Level of Participation: active Quality of Participation: attentive, cooperative, and engaged Interactions with others: gave feedback Mood/Affect: appropriate, brightens with interaction, and positive Triggers (if applicable): na Cognition: coherent/clear and guilty Progress: Moderate Response: Pt identified the Why are we here reading being Important to her. Patient reported she has to keep in mind how her addiction has lead her to this point in life and all the irrational decisions.       Plan: follow-up needed  Patients Problems:  Patient Active Problem List   Diagnosis Date Noted   Alcohol abuse 11/29/2024   Alcohol use disorder, moderate, dependence (HCC) 11/28/2024   Neuropathy 01/13/2024   Hyponatremia 05/14/2021   Alcohol withdrawal (HCC) 05/11/2021   AKI (acute kidney injury) 05/11/2021   Hypokalemia 05/11/2021   Jaundice 05/11/2021   Prolonged QT interval 05/11/2021   Acute hyperactive alcohol withdrawal delirium (HCC) 12/31/2020    Hypomagnesemia 12/31/2020   Hyperbilirubinemia 12/31/2020   Infected sebaceous cyst of back 12/31/2020   Pyuria 12/31/2020   Seizure due to alcohol withdrawal (HCC) 12/30/2020   Nausea and vomiting 12/30/2020   Tobacco use 12/30/2020   Thrombocytopenia 12/30/2020   Seizure (HCC) 12/30/2020   Alcohol dependence with withdrawal with complication (HCC) 04/29/2018   Major depressive disorder, 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

## 2024-12-02 NOTE — Care Management (Signed)
 [facility] Care Management...  Writer called [facility] to request a copy of their referral.  Client completed a phone interview yesterday but they are now requesting a completed referral from their agency.  Writer spoke with administration but she reports she's unsure of the exact documents that are needed.  Writer was transferred to intake but they did not answer.  Writer left a HIPAA compliant voicemail requesting a returned phone call on the client's behalf.

## 2024-12-02 NOTE — ED Notes (Signed)
 Patient resting prior to RN coming to administer med. Patient has no complaints of pain or s/s of distress.

## 2024-12-02 NOTE — Group Note (Signed)
 Group Topic: Healthy Self Image and Positive Change  Group Date: 12/02/2024 Start Time: 1100 End Time: 1200 Facilitators: Maziyah Vessel, LCSW  Department: [facility]  Number of Participants: 6  Group Focus: abuse issues, chemical dependency education, feeling awareness/expression, forgiveness, personal responsibility, relapse prevention, self-esteem, and substance abuse education Treatment Modality:  Cognitive Behavioral Therapy Interventions utilized were group exercise, problem solving, and support Purpose: enhance coping skills, express feelings, express irrational fears, increase insight, regain self-worth, and relapse prevention strategies  Writer utilized a CBT activity called Gratitude Log.  The activity explored with each client things they are thankful for, best memories,  challenges/learned from those challenges, and their goals related to substance use.    Name: Miranda Wood Date of Birth: 31-Oct-1975  MR: [id number]    Level of Participation: active Quality of Participation: cooperative, engaged, initiates communication, and offered feedback Interactions with others: gave feedback Mood/Affect: appropriate, brightens with interaction, and positive Triggers (if applicable): Client reports her children not being supported and relationship Cognition: coherent/clear, goal directed, and logical Progress: Significant Response: Client was attentive and engaged.   Plan: follow-up as needed  Patients Problems:  Patient Active Problem List   Diagnosis Date Noted   Alcohol abuse 11/29/2024   Alcohol use disorder, moderate, dependence (HCC) 11/28/2024   Neuropathy 01/13/2024   Hyponatremia 05/14/2021   Alcohol withdrawal (HCC) 05/11/2021   AKI (acute kidney injury) 05/11/2021   Hypokalemia 05/11/2021   Jaundice 05/11/2021   Prolonged QT interval 05/11/2021   Acute hyperactive alcohol withdrawal delirium (HCC) 12/31/2020   Hypomagnesemia  12/31/2020   Hyperbilirubinemia 12/31/2020   Infected sebaceous cyst of back 12/31/2020   Pyuria 12/31/2020   Seizure due to alcohol withdrawal (HCC) 12/30/2020   Nausea and vomiting 12/30/2020   Tobacco use 12/30/2020   Thrombocytopenia 12/30/2020   Seizure (HCC) 12/30/2020   Alcohol dependence with withdrawal with complication (HCC) 04/29/2018   Major depressive disorder, 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   Substance induced mood disorder (HCC)    Substance-induced anxiety disorder (HCC)    Alcohol withdrawal without perceptual disturbances with complication [facility])    Cesarean delivery delivered 07/10/2011

## 2024-12-02 NOTE — ED Notes (Signed)
 Assumed care of pt at 1130am. Pt calm cooperative, pt in group with child psychotherapist.

## 2024-12-02 NOTE — ED Notes (Signed)
 Assumed care of pt at 1900. Pt cooperative. No inappropriate behaviors observed or reported at this time. Environment secured. Safety checks in place per facility protocol.

## 2024-12-02 NOTE — Care Management (Signed)
 [facility] Care Management...   Patient completed phone assessment with [facility]  Writer spoke with [facility] and they needed more information regarding patient medications and diagnosis  Per [facility] patient is currently under review for placement

## 2024-12-02 NOTE — ED Notes (Signed)
 Pt presents with a flat affect .  Pt is pleasant c/o left shoulder pain Norco given for comfort left shoulder is without immobilization. Offers good eye contact .  Affect flat. Pt is maintained on CIWA. Needs anticipated.  Pt denies thoughts of self harm or thoughts of suicide.  Denies aura, pt has hx of seizure disorder, maintianed on phenobarbital  taper.  Pt is compliant with medication.Needs anticipated. Safety maintianed

## 2024-12-02 NOTE — [facility] Group Notes (Signed)
 Adult Psychoeducational Group Note  Date:  12/02/2024 Time:  2:22 PM  Group Topic/Focus:  Emotional Education:   Mental Health Trivia & Questionnaire   Participation Level:  Active  Participation Quality:  Appropriate  Affect:  Appropriate  Cognitive:  Alert  Insight: Appropriate  Engagement in Group:  Engaged  Modes of Intervention:  Activity  Additional Comments:  Pt attended and participated in group discussion and activity game.   Niel CHRISTELLA Nightingale 12/02/2024, 2:22 PM

## 2024-12-03 MED ORDER — SERTRALINE HCL 25 MG PO TABS
25.0000 mg | ORAL_TABLET | Freq: Every day | ORAL | Status: AC
  Administered 2024-12-04: 25 mg via ORAL
  Filled 2024-12-03: qty 1

## 2024-12-03 NOTE — ED Provider Notes (Cosign Needed Addendum)
 Behavioral Health Progress Note  Date and Time: 12/03/2024 11:34 AM Name: Miranda Wood MRN:  [id number]  Subjective:  Miranda Wood reports sleeping well and states alcohol cravings are pretty much under control. Miranda Wood is sleeping well and looking forward to discharge.    Miranda Wood is a 49 year old female who presented voluntarily to [facility] emergency department on 11/28/2024.  Psychiatric history includes MDD, substance-induced mood disorder, alcohol use disorder,  and substance induced anxiety disorder.  Patient transferred to [facility] behavioral health [facility] on 11/29/2024.  Patient request alcohol detox upon arrival.  Patient endorses history of alcohol related seizure (3 episodes) and delirium tremens.  Prior to arrival patient drinking up to 1/5 of liquor daily. Most recent residential substance use treatment at age 62.  Miranda Wood assessed by this NP on 12/03/2024 face to face on the couch in [facility]. Miranda Wood is alert and oriented enjoying a coffee. Miranda Wood is cooperative and calm and expresses hopefulness about finding a new residence and getting to see your children more often. Endorses stable appetite but interrupted sleep due to left clavicle pain- Miranda Wood is not wearing a sling. Inquires about discharge to [facility] today which has not yet been confirmed.   Diagnosis:  Final diagnoses:  Substance induced mood disorder (HCC)  Alcohol withdrawal syndrome without complication (HCC)  Vitamin D  deficiency    Total Time spent with patient: 20 minutes  Past Psychiatric History: MDD, GAD, PTSD, alcohol use disorder Family History: Alcohol use disorder- brother, mother, and maternal grandmother Family Psychiatric  History: alcohol use disorder- brother, mother, and maternal grandmother Social History: Tells this provider that Miranda Wood is homeless. Sleeping on couches of friends/family or residing in a tent. Denies access to weapons. Previously worked as a engineer, civil (consulting).                           Sleep: Good  Appetite:  Good  Current Medications:  Current Facility-Administered Medications  Medication Dose Route Frequency Provider Last Rate Last Admin   acetaminophen  (TYLENOL ) tablet 650 mg  650 mg Oral Q6H PRN Motley-Mangrum, Jadeka A, PMHNP   650 mg at 12/03/24 0816   alum & mag hydroxide-simeth (MAALOX/MYLANTA) 200-200-20 MG/5ML suspension 30 mL  30 mL Oral Q4H PRN Motley-Mangrum, Jadeka A, PMHNP       ibuprofen  (ADVIL ) tablet 600 mg  600 mg Oral Q8H PRN Motley-Mangrum, Jadeka A, PMHNP   600 mg at 12/02/24 0701   magnesium  hydroxide (MILK OF MAGNESIA) suspension 30 mL  30 mL Oral Daily PRN Motley-Mangrum, Jadeka A, PMHNP       OLANZapine  (ZYPREXA ) injection 10 mg  10 mg Intramuscular TID PRN Motley-Mangrum, Jadeka A, PMHNP       OLANZapine  (ZYPREXA ) injection 5 mg  5 mg Intramuscular TID PRN Motley-Mangrum, Jadeka A, PMHNP       OLANZapine  zydis (ZYPREXA ) disintegrating tablet 5 mg  5 mg Oral TID PRN Motley-Mangrum, Jadeka A, PMHNP   5 mg at 12/01/24 2136   PHENobarbital  (LUMINAL) tablet 32.4 mg  32.4 mg Oral Q8H Motley-Mangrum, Jadeka A, PMHNP   32.4 mg at 12/03/24 0816   thiamine  (VITAMIN B1) tablet 100 mg  100 mg Oral Daily Motley-Mangrum, Jadeka A, PMHNP   100 mg at 12/03/24 9082   Or   thiamine  (VITAMIN B1) injection 100 mg  100 mg Intravenous Daily Motley-Mangrum, Jadeka A, PMHNP   100 mg at 11/30/24 0946   Vitamin D  (Ergocalciferol ) (DRISDOL ) 1.25 MG (50000 UNIT) capsule 50,000  Units  50,000 Units Oral Q7 days Dasie Ellouise CROME, FNP   50,000 Units at 12/02/24 9063   Current Outpatient Medications  Medication Sig Dispense Refill   acetaminophen  (TYLENOL ) 325 MG tablet Take 650 mg by mouth every 6 (six) hours as needed for moderate pain (pain score 4-6).      Labs  Lab Results:  Admission on 11/29/2024  Component Date Value Ref Range Status   Vitamin B-12 11/30/2024 546  180 - 914 pg/mL Final   Performed at [facility] Lab, [address]  [place], [place] [address]   Folate 11/30/2024 6.4  >5.9 ng/mL Final   Performed at [facility], [address], [place], [place] [address]   Vit D, 25-Hydroxy 11/30/2024 13.4 (L)  30 - 100 ng/mL Final   Comment: (NOTE) Vitamin D  deficiency has been defined by the Institute of Medicine  and an Endocrine Society practice guideline as a level of serum 25-OH  vitamin D  less than 20 ng/mL (1,2). The Endocrine Society went on to  further define vitamin D  insufficiency as a level between 21 and 29  ng/mL (2).  1. IOM (Institute of Medicine). 2010. Dietary reference intakes for  calcium and D. [place]: [employer]. 2. Holick MF, Binkley [place], Bischoff-Ferrari HA, et al. Evaluation,  treatment, and prevention of vitamin D  deficiency: an Endocrine  Society clinical practice guideline, JCEM. 2011 Jul; 96(7): 1911-30.  Performed at [facility] Lab, [address] [place], [place] [address]   Admission on 11/27/2024, Discharged on 11/29/2024  Component Date Value Ref Range Status   WBC 11/28/2024 5.5  4.0 - 10.5 K/uL Final   RBC 11/28/2024 3.06 (L)  3.87 - 5.11 MIL/uL Final   Hemoglobin 11/28/2024 11.4 (L)  12.0 - 15.0 g/dL Final   HCT 98/68/7973 33.1 (L)  36.0 - 46.0 % Final   MCV 11/28/2024 108.2 (H)  80.0 - 100.0 fL Final   MCH 11/28/2024 37.3 (H)  26.0 - 34.0 pg Final   MCHC 11/28/2024 34.4  30.0 - 36.0 g/dL Final   RDW 98/68/7973 14.1  11.5 - 15.5 % Final   Platelets 11/28/2024 178  150 - 400 K/uL Final   nRBC 11/28/2024 0.0  0.0 - 0.2 % Final   Neutrophils Relative % 11/28/2024 47  % Final   Neutro Abs 11/28/2024 2.6  1.7 - 7.7 K/uL Final   Lymphocytes Relative 11/28/2024 43  % Final   Lymphs Abs 11/28/2024 2.4  0.7 - 4.0 K/uL Final   Monocytes Relative 11/28/2024 8  % Final   Monocytes Absolute 11/28/2024 0.4  0.1 - 1.0 K/uL Final   Eosinophils Relative 11/28/2024 1  % Final   Eosinophils Absolute 11/28/2024 0.1  0.0 - 0.5 K/uL Final   Basophils Relative  11/28/2024 1  % Final   Basophils Absolute 11/28/2024 0.1  0.0 - 0.1 K/uL Final   Immature Granulocytes 11/28/2024 0  % Final   Abs Immature Granulocytes 11/28/2024 0.02  0.00 - 0.07 K/uL Final   Performed at [facility], [address], [place], [place] [address]   Sodium 11/28/2024 137  135 - 145 mmol/L Final   Potassium 11/28/2024 3.8  3.5 - 5.1 mmol/L Final   Chloride 11/28/2024 102  98 - 111 mmol/L Final   CO2 11/28/2024 21 (L)  22 - 32 mmol/L Final   Glucose, Bld 11/28/2024 78  70 - 99 mg/dL Final   Glucose reference range applies only to  samples taken after fasting for at least 8 hours.   BUN 11/28/2024 9  6 - 20 mg/dL Final   Creatinine, Ser 11/28/2024 0.48  0.44 - 1.00 mg/dL Final   Calcium 98/68/7973 8.1 (L)  8.9 - 10.3 mg/dL Final   Total Protein 98/68/7973 6.4 (L)  6.5 - 8.1 g/dL Final   Albumin 98/68/7973 3.4 (L)  3.5 - 5.0 g/dL Final   AST 98/68/7973 89 (H)  15 - 41 U/L Final   ALT 11/28/2024 24  0 - 44 U/L Final   Alkaline Phosphatase 11/28/2024 92  38 - 126 U/L Final   Total Bilirubin 11/28/2024 0.5  0.0 - 1.2 mg/dL Final   GFR, Estimated 11/28/2024 >60  >60 mL/min Final   Comment: (NOTE) Calculated using the CKD-EPI Creatinine Equation (2021)    Anion gap 11/28/2024 14  5 - 15 Final   Performed at [facility] Lab, [address] [place], [place] [address]   Alcohol, Ethyl (B) 11/28/2024 96 (H)  <15 mg/dL Final   Comment: (NOTE) For medical purposes only. Performed at [facility], [address], [place], [place] [address]    Preg, Serum 11/28/2024 NEGATIVE  NEGATIVE Final   Comment:        THE SENSITIVITY OF THIS METHODOLOGY IS >10 mIU/mL. Performed at [facility], [address] [place], [place] [address]    Magnesium  11/28/2024 1.6 (L)  1.7 - 2.4 mg/dL Final   Performed at [facility], [address] [place], [place] [address]   Opiates 11/28/2024 NEGATIVE  NEGATIVE Final   Cocaine 11/28/2024 NEGATIVE  NEGATIVE Final    Benzodiazepines 11/28/2024 POSITIVE (A)  NEGATIVE Final   Amphetamines 11/28/2024 NEGATIVE  NEGATIVE Final   Tetrahydrocannabinol 11/28/2024 NEGATIVE  NEGATIVE Final   Barbiturates 11/28/2024 POSITIVE (A)  NEGATIVE Final   Methadone Scn, Ur 11/28/2024 NEGATIVE  NEGATIVE Final   Fentanyl  11/28/2024 NEGATIVE  NEGATIVE Final   Comment: (NOTE) Drug screen is for Medical Purposes only. Positive results are preliminary only. If confirmation is needed, notify lab within 5 days.  Drug Class                 Cutoff (ng/mL) Amphetamine and metabolites 1000 Barbiturate and metabolites 200 Benzodiazepine              200 Opiates and metabolites     300 Cocaine and metabolites     300 THC                         50 Fentanyl                     5 Methadone                   300  Trazodone is metabolized in vivo to several metabolites,  including pharmacologically active m-CPP, which is excreted in the  urine.  Immunoassay screens for amphetamines and MDMA have potential  cross-reactivity with these compounds and may provide false positive  result.  Performed at [facility] Lab, [address] [place], [place] [address]    Magnesium  11/28/2024 2.0  1.7 - 2.4 mg/dL Final   Performed at [facility], [address], [place], [place] [address]  Admission on 06/04/2024, Discharged on 06/04/2024  Component Date Value Ref Range Status   Sodium 06/04/2024 141  135 - 145 mmol/L Final   Potassium 06/04/2024 3.3 (  L)  3.5 - 5.1 mmol/L Final   Chloride 06/04/2024 99  98 - 111 mmol/L Final   CO2 06/04/2024 29  22 - 32 mmol/L Final   Glucose, Bld 06/04/2024 97  70 - 99 mg/dL Final   Glucose reference range applies only to samples taken after fasting for at least 8 hours.   BUN 06/04/2024 6  6 - 20 mg/dL Final   Creatinine, Ser 06/04/2024 0.61  0.44 - 1.00 mg/dL Final   Calcium 91/92/7974 8.4 (L)  8.9 - 10.3 mg/dL Final   Total Protein 91/92/7974 7.6  6.5 - 8.1 g/dL Final   Albumin 91/92/7974  3.6  3.5 - 5.0 g/dL Final   AST 91/92/7974 248 (H)  15 - 41 U/L Final   ALT 06/04/2024 43  0 - 44 U/L Final   Alkaline Phosphatase 06/04/2024 89  38 - 126 U/L Final   Total Bilirubin 06/04/2024 0.8  0.0 - 1.2 mg/dL Final   GFR, Estimated 06/04/2024 >60  >60 mL/min Final   Comment: (NOTE) Calculated using the CKD-EPI Creatinine Equation (2021)    Anion gap 06/04/2024 13  5 - 15 Final   Performed at [facility], [address], [place], [place] [address]   Alcohol, Ethyl (B) 06/04/2024 497 (HH)  <15 mg/dL Final   Comment: CRITICAL RESULT CALLED TO, READ BACK BY AND VERIFIED WITH FOX,B. RN AT 1440 06/04/24 MULLINS,T (NOTE) For medical purposes only. Performed at [facility], [address], [place], [place] [address]    WBC 06/04/2024 4.6  4.0 - 10.5 K/uL Final   RBC 06/04/2024 3.37 (L)  3.87 - 5.11 MIL/uL Final   Hemoglobin 06/04/2024 12.0  12.0 - 15.0 g/dL Final   HCT 91/92/7974 36.7  36.0 - 46.0 % Final   MCV 06/04/2024 108.9 (H)  80.0 - 100.0 fL Final   MCH 06/04/2024 35.6 (H)  26.0 - 34.0 pg Final   MCHC 06/04/2024 32.7  30.0 - 36.0 g/dL Final   RDW 91/92/7974 13.2  11.5 - 15.5 % Final   Platelets 06/04/2024 104 (L)  150 - 400 K/uL Final   nRBC 06/04/2024 0.0  0.0 - 0.2 % Final   Neutrophils Relative % 06/04/2024 46  % Final   Neutro Abs 06/04/2024 2.1  1.7 - 7.7 K/uL Final   Lymphocytes Relative 06/04/2024 40  % Final   Lymphs Abs 06/04/2024 1.8  0.7 - 4.0 K/uL Final   Monocytes Relative 06/04/2024 9  % Final   Monocytes Absolute 06/04/2024 0.4  0.1 - 1.0 K/uL Final   Eosinophils Relative 06/04/2024 2  % Final   Eosinophils Absolute 06/04/2024 0.1  0.0 - 0.5 K/uL Final   Basophils Relative 06/04/2024 2  % Final   Basophils Absolute 06/04/2024 0.1  0.0 - 0.1 K/uL Final   Immature Granulocytes 06/04/2024 1  % Final   Abs Immature Granulocytes 06/04/2024 0.06  0.00 - 0.07 K/uL Final   Performed at [facility], [address] [place], [place] [address]   Prothrombin Time 06/04/2024 13.2  11.4 - 15.2 seconds Final   INR 06/04/2024 0.9  0.8 - 1.2 Final   Comment: (NOTE) INR goal varies based on device and disease states. Performed at [facility], [address], [place], [place] [address]    Preg, Serum 06/04/2024 NEGATIVE  NEGATIVE Final   Comment:        THE SENSITIVITY OF THIS METHODOLOGY IS >10 mIU/mL. Performed at [facility], [address], [place], [place] [address]    Color, Urine 06/04/2024 YELLOW  YELLOW Final   APPearance 06/04/2024 CLEAR  CLEAR Final   Specific Gravity, Urine 06/04/2024 1.008  1.005 - 1.030 Final   pH 06/04/2024 6.0  5.0 - 8.0 Final   Glucose, UA 06/04/2024 NEGATIVE  NEGATIVE mg/dL Final   Hgb urine dipstick 06/04/2024 NEGATIVE  NEGATIVE Final   Bilirubin Urine 06/04/2024 NEGATIVE  NEGATIVE Final   Ketones, ur 06/04/2024 NEGATIVE  NEGATIVE mg/dL Final   Protein, ur 91/92/7974 NEGATIVE  NEGATIVE mg/dL Final   Nitrite 91/92/7974 POSITIVE (A)  NEGATIVE Final   Leukocytes,Ua 06/04/2024 TRACE (A)  NEGATIVE Final   RBC / HPF 06/04/2024 0-5  0 - 5 RBC/hpf Final   WBC, UA 06/04/2024 0-5  0 - 5 WBC/hpf Final   Bacteria, UA 06/04/2024 RARE (A)  NONE SEEN Final   Squamous Epithelial / HPF 06/04/2024 0-5  0 - 5 /HPF Final   Mucus 06/04/2024 PRESENT   Final   Amorphous Crystal 06/04/2024 PRESENT   Final   Performed at [facility], [address] [place], [place] [address]   Opiates 06/04/2024 NONE DETECTED  NONE DETECTED Final   Cocaine 06/04/2024 NONE DETECTED  NONE DETECTED Final   Benzodiazepines 06/04/2024 NONE DETECTED  NONE DETECTED Final   Amphetamines 06/04/2024 NONE DETECTED  NONE DETECTED Final   Tetrahydrocannabinol 06/04/2024 NONE DETECTED  NONE DETECTED Final   Barbiturates 06/04/2024 NONE DETECTED  NONE DETECTED Final   Comment: (NOTE) DRUG SCREEN FOR MEDICAL PURPOSES ONLY.  IF CONFIRMATION IS  NEEDED FOR ANY PURPOSE, NOTIFY LAB WITHIN 5 DAYS.  LOWEST DETECTABLE LIMITS FOR URINE DRUG SCREEN Drug Class                     Cutoff (ng/mL) Amphetamine and metabolites    1000 Barbiturate and metabolites    200 Benzodiazepine                 200 Opiates and metabolites        300 Cocaine and metabolites        300 THC                            50 Performed at [facility], [address] [place], [place] [address]     Blood Alcohol level:  Lab Results  Component Value Date   ETH 96 (H) 11/28/2024   ETH 497 (HH) 06/04/2024    Metabolic Disorder Labs: No results found for: HGBA1C, MPG No results found for: PROLACTIN No results found for: CHOL, TRIG, HDL, CHOLHDL, VLDL, LDLCALC  Therapeutic Lab Levels: No results found for: LITHIUM No results found for: VALPROATE No results found for: CBMZ  Physical Findings   AIMS    Flowsheet Row [facility]  AIMS Total Score 0   AUDIT    Flowsheet Row [facility]  Alcohol Use Disorder Identification Test Final Score (AUDIT) 18   PHQ2-9    Flowsheet Row ED from 11/29/2024 in [facility]  PHQ-2 Total Score 2  PHQ-9 Total Score 11   Flowsheet Row ED from 11/29/2024 in [facility] ED from 11/27/2024 in [facility] ED from 06/04/2024 in [facility] Department at [facility]  C-SSRS RISK CATEGORY No Risk No Risk No Risk     Musculoskeletal  Strength & Muscle Tone: within normal limits Gait & Station: normal Patient leans: N/A  Psychiatric Specialty Exam  Presentation  General Appearance:  Appropriate for Environment  Eye Contact: Good  Speech: Clear and Coherent; Normal Rate  Speech  Volume: Normal  Handedness: Right   Mood and Affect  Mood: Euthymic  Affect: Appropriate   Thought Process  Thought Processes: Coherent; Goal Directed  Descriptions of Associations:Intact  Orientation:Full (Time, Place and Person)  Thought Content:Logical     Hallucinations:Hallucinations: None  Ideas of Reference:None  Suicidal Thoughts:Suicidal Thoughts: No  Homicidal Thoughts:Homicidal Thoughts: No   Sensorium  Memory: Immediate Good  Judgment: Fair  Insight: Fair   Art Therapist  Concentration: Fair  Attention Span: Good  Recall: Good  Fund of Knowledge: Good  Language: Good   Psychomotor Activity  Psychomotor Activity: Psychomotor Activity: Normal   Assets  Assets: Desire for Improvement   Sleep  Sleep: Sleep: Fair  Estimated Sleeping Duration (Last 24 Hours): 8.50-10.00 hours  Nutritional Assessment (For OBS and [facility] admissions only) Has the patient had a weight loss or gain of 10 pounds or more in the last 3 months?: No Has the patient had a decrease in food intake/or appetite?: No Does the patient have dental problems?: No Does the patient have eating habits or behaviors that may be indicators of an eating disorder including binging or inducing vomiting?: No Has the patient recently lost weight without trying?: 0 Has the patient been eating poorly because of a decreased appetite?: 0 Malnutrition Screening Tool Score: 0    Physical Exam  Physical Exam ROS Blood pressure 129/88, pulse (!) 103, temperature 97.9 F (36.6 C), resp. rate 17, SpO2 100%. There is no height or weight on file to calculate BMI.  Treatment Plan Summary: Daily contact with patient to assess and evaluate symptoms and progress in treatment  Warren Archer, FNP-C 12/03/2024 11:34 AM     Archer Warren, RN 12/03/24 1222    Archer Warren, RN 12/03/24 [phone]

## 2024-12-03 NOTE — Group Note (Signed)
 Group Topic: Positive Affirmations  Group Date: 12/03/2024 Start Time: 2015 End Time: 2045 Facilitators: Lauree Pickle, NT  Department: [facility]  Number of Participants: 5  Group Focus: feeling awareness/expression Treatment Modality:  Leisure Development Interventions utilized were group exercise Purpose: express feelings  Name: Miranda Wood Date of Birth: [birth year]  MR: [id number]    Level of Participation: active Quality of Participation: cooperative Interactions with others: gave feedback Mood/Affect: appropriate Triggers (if applicable): N/A Cognition: coherent/clear Progress: Moderate Response: Good Plan: follow-up needed  Patients Problems:  Patient Active Problem List   Diagnosis Date Noted   Alcohol abuse 11/29/2024   Alcohol use disorder, moderate, dependence (HCC) 11/28/2024   Neuropathy 01/13/2024   Hyponatremia 05/14/2021   Alcohol withdrawal (HCC) 05/11/2021   AKI (acute kidney injury) 05/11/2021   Hypokalemia 05/11/2021   Jaundice 05/11/2021   Prolonged QT interval 05/11/2021   Acute hyperactive alcohol withdrawal delirium (HCC) 12/31/2020   Hypomagnesemia 12/31/2020   Hyperbilirubinemia 12/31/2020   Infected sebaceous cyst of back 12/31/2020   Pyuria 12/31/2020   Seizure due to alcohol withdrawal (HCC) 12/30/2020   Nausea and vomiting 12/30/2020   Tobacco use 12/30/2020   Thrombocytopenia 12/30/2020   Seizure (HCC) 12/30/2020   Alcohol dependence with withdrawal with complication (HCC) 04/29/2018   Major depressive disorder, 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   Substance induced mood disorder (HCC)    Substance-induced anxiety disorder (HCC)    Alcohol withdrawal without perceptual disturbances with complication [facility])    Cesarean delivery delivered 07/10/2011

## 2024-12-03 NOTE — ED Provider Notes (Signed)
 Behavioral Health Progress Note  Date and Time: 12/03/2024 6:17 PM Name: Miranda Wood MRN:  [id number]  Subjective:  Miranda Wood reports sleeping well and states alcohol cravings are pretty much under control. She is sleeping well and looking forward to discharge.    Miranda Wood is a 49 year old female who presented voluntarily to [facility] emergency department on 11/28/2024.  Psychiatric history includes MDD, substance-induced mood disorder, alcohol use disorder,  and substance induced anxiety disorder.  Patient transferred to [facility] behavioral health [facility] on 11/29/2024.  Patient request alcohol detox upon arrival.  Patient endorses history of alcohol related seizure (3 episodes) and delirium tremens.  Prior to arrival patient drinking up to 1/5 of liquor daily. Most recent residential substance use treatment at age 35.  Miranda Wood assessed by this provider on 12/03/2024 face to face on the couch in [facility]. She is alert and oriented enjoying a coffee. She is cooperative and calm and expresses hopefulness about finding a new residence and getting to see your children more often. Endorses stable appetite but interrupted sleep due to left clavicle pain- she is not wearing a sling. Inquires about discharge to [facility] today which has not yet been confirmed. Pt continues to deny SI, HI and AVH. Tremors have improved and she denies any other withdrawal symptoms at this time. She reports a stable mood but at times feels anxious and reports previously being on Lexapro  which was helpful for both depressive symptoms and anxiety. Due to her prolonged QT interval, we further dicussed trying Zoloft  instead and pt agreed.   Diagnosis:  Final diagnoses:  Substance induced mood disorder (HCC)  Alcohol withdrawal syndrome without complication (HCC)  Vitamin D  deficiency    Total Time spent with patient: 20 minutes  Past Psychiatric History: MDD, GAD, PTSD, alcohol use  disorder Family History: Alcohol use disorder- brother, mother, and maternal grandmother Family Psychiatric  History: alcohol use disorder- brother, mother, and maternal grandmother Social History: Tells this provider that she is homeless. Sleeping on couches of friends/family or residing in a tent. Denies access to weapons. Previously worked as a engineer, civil (consulting).                          Sleep: Good  Appetite:  Good  Current Medications:  Current Facility-Administered Medications  Medication Dose Route Frequency Provider Last Rate Last Admin   acetaminophen  (TYLENOL ) tablet 650 mg  650 mg Oral Q6H PRN Motley-Mangrum, Jadeka A, PMHNP   650 mg at 12/03/24 0816   alum & mag hydroxide-simeth (MAALOX/MYLANTA) 200-200-20 MG/5ML suspension 30 mL  30 mL Oral Q4H PRN Motley-Mangrum, Jadeka A, PMHNP       ibuprofen  (ADVIL ) tablet 600 mg  600 mg Oral Q8H PRN Motley-Mangrum, Jadeka A, PMHNP   600 mg at 12/02/24 0701   magnesium  hydroxide (MILK OF MAGNESIA) suspension 30 mL  30 mL Oral Daily PRN Motley-Mangrum, Jadeka A, PMHNP       OLANZapine  (ZYPREXA ) injection 10 mg  10 mg Intramuscular TID PRN Motley-Mangrum, Jadeka A, PMHNP       OLANZapine  (ZYPREXA ) injection 5 mg  5 mg Intramuscular TID PRN Motley-Mangrum, Jadeka A, PMHNP       OLANZapine  zydis (ZYPREXA ) disintegrating tablet 5 mg  5 mg Oral TID PRN Motley-Mangrum, Jadeka A, PMHNP   5 mg at 12/01/24 2136   PHENobarbital  (LUMINAL) tablet 32.4 mg  32.4 mg Oral Q8H Motley-Mangrum, Jadeka A, PMHNP   32.4 mg at 12/03/24 1628  thiamine  (VITAMIN B1) tablet 100 mg  100 mg Oral Daily Motley-Mangrum, Jadeka A, PMHNP   100 mg at 12/03/24 9082   Or   thiamine  (VITAMIN B1) injection 100 mg  100 mg Intravenous Daily Motley-Mangrum, Jadeka A, PMHNP   100 mg at 11/30/24 0946   Vitamin D  (Ergocalciferol ) (DRISDOL ) 1.25 MG (50000 UNIT) capsule 50,000 Units  50,000 Units Oral Q7 days Dasie Ellouise CROME, FNP   50,000 Units at 12/02/24 9063   Current Outpatient  Medications  Medication Sig Dispense Refill   acetaminophen  (TYLENOL ) 325 MG tablet Take 650 mg by mouth every 6 (six) hours as needed for moderate pain (pain score 4-6).      Labs  Lab Results:  Admission on 11/29/2024  Component Date Value Ref Range Status   Vitamin B-12 11/30/2024 546  180 - 914 pg/mL Final   Performed at [facility], [address] [place], [place] [address]   Folate 11/30/2024 6.4  >5.9 ng/mL Final   Performed at [facility], [address], [place], [place] [address]   Vit D, 25-Hydroxy 11/30/2024 13.4 (L)  30 - 100 ng/mL Final   Comment: (NOTE) Vitamin D  deficiency has been defined by the Institute of Medicine  and an Endocrine Society practice guideline as a level of serum 25-OH  vitamin D  less than 20 ng/mL (1,2). The Endocrine Society went on to  further define vitamin D  insufficiency as a level between 21 and 29  ng/mL (2).  1. IOM (Institute of Medicine). 2010. Dietary reference intakes for  calcium and D. [place]: [employer]. 2. Holick MF, Binkley [place], Bischoff-Ferrari HA, et al. Evaluation,  treatment, and prevention of vitamin D  deficiency: an Endocrine  Society clinical practice guideline, JCEM. 2011 Jul; 96(7): 1911-30.  Performed at [facility], [address] [place], [place] [address]   Admission on 11/27/2024, Discharged on 11/29/2024  Component Date Value Ref Range Status   WBC 11/28/2024 5.5  4.0 - 10.5 K/uL Final   RBC 11/28/2024 3.06 (L)  3.87 - 5.11 MIL/uL Final   Hemoglobin 11/28/2024 11.4 (L)  12.0 - 15.0 g/dL Final   HCT 98/68/7973 33.1 (L)  36.0 - 46.0 % Final   MCV 11/28/2024 108.2 (H)  80.0 - 100.0 fL Final   MCH 11/28/2024 37.3 (H)  26.0 - 34.0 pg Final   MCHC 11/28/2024 34.4  30.0 - 36.0 g/dL Final   RDW 98/68/7973 14.1  11.5 - 15.5 % Final   Platelets 11/28/2024 178  150 - 400 K/uL Final   nRBC 11/28/2024 0.0  0.0 - 0.2 % Final   Neutrophils Relative % 11/28/2024 47  % Final    Neutro Abs 11/28/2024 2.6  1.7 - 7.7 K/uL Final   Lymphocytes Relative 11/28/2024 43  % Final   Lymphs Abs 11/28/2024 2.4  0.7 - 4.0 K/uL Final   Monocytes Relative 11/28/2024 8  % Final   Monocytes Absolute 11/28/2024 0.4  0.1 - 1.0 K/uL Final   Eosinophils Relative 11/28/2024 1  % Final   Eosinophils Absolute 11/28/2024 0.1  0.0 - 0.5 K/uL Final   Basophils Relative 11/28/2024 1  % Final   Basophils Absolute 11/28/2024 0.1  0.0 - 0.1 K/uL Final   Immature Granulocytes 11/28/2024 0  % Final   Abs Immature Granulocytes 11/28/2024 0.02  0.00 - 0.07 K/uL Final   Performed at [facility], [address] [place], [place] [address]   Sodium 11/28/2024  137  135 - 145 mmol/L Final   Potassium 11/28/2024 3.8  3.5 - 5.1 mmol/L Final   Chloride 11/28/2024 102  98 - 111 mmol/L Final   CO2 11/28/2024 21 (L)  22 - 32 mmol/L Final   Glucose, Bld 11/28/2024 78  70 - 99 mg/dL Final   Glucose reference range applies only to samples taken after fasting for at least 8 hours.   BUN 11/28/2024 9  6 - 20 mg/dL Final   Creatinine, Ser 11/28/2024 0.48  0.44 - 1.00 mg/dL Final   Calcium 98/68/7973 8.1 (L)  8.9 - 10.3 mg/dL Final   Total Protein 98/68/7973 6.4 (L)  6.5 - 8.1 g/dL Final   Albumin 98/68/7973 3.4 (L)  3.5 - 5.0 g/dL Final   AST 98/68/7973 89 (H)  15 - 41 U/L Final   ALT 11/28/2024 24  0 - 44 U/L Final   Alkaline Phosphatase 11/28/2024 92  38 - 126 U/L Final   Total Bilirubin 11/28/2024 0.5  0.0 - 1.2 mg/dL Final   GFR, Estimated 11/28/2024 >60  >60 mL/min Final   Comment: (NOTE) Calculated using the CKD-EPI Creatinine Equation (2021)    Anion gap 11/28/2024 14  5 - 15 Final   Performed at [facility], [address] [place], [place] [address]   Alcohol, Ethyl (B) 11/28/2024 96 (H)  <15 mg/dL Final   Comment: (NOTE) For medical purposes only. Performed at [facility], [address], [place], [place] [address]    Preg, Serum 11/28/2024 NEGATIVE  NEGATIVE Final    Comment:        THE SENSITIVITY OF THIS METHODOLOGY IS >10 mIU/mL. Performed at [facility], [address], [place], [place] [address]    Magnesium  11/28/2024 1.6 (L)  1.7 - 2.4 mg/dL Final   Performed at [facility], [address], [place], [place] [address]   Opiates 11/28/2024 NEGATIVE  NEGATIVE Final   Cocaine 11/28/2024 NEGATIVE  NEGATIVE Final   Benzodiazepines 11/28/2024 POSITIVE (A)  NEGATIVE Final   Amphetamines 11/28/2024 NEGATIVE  NEGATIVE Final   Tetrahydrocannabinol 11/28/2024 NEGATIVE  NEGATIVE Final   Barbiturates 11/28/2024 POSITIVE (A)  NEGATIVE Final   Methadone Scn, Ur 11/28/2024 NEGATIVE  NEGATIVE Final   Fentanyl  11/28/2024 NEGATIVE  NEGATIVE Final   Comment: (NOTE) Drug screen is for Medical Purposes only. Positive results are preliminary only. If confirmation is needed, notify lab within 5 days.  Drug Class                 Cutoff (ng/mL) Amphetamine and metabolites 1000 Barbiturate and metabolites 200 Benzodiazepine              200 Opiates and metabolites     300 Cocaine and metabolites     300 THC                         50 Fentanyl                     5 Methadone                   300  Trazodone is metabolized in vivo to several metabolites,  including pharmacologically active m-CPP, which is excreted in the  urine.  Immunoassay screens for amphetamines and MDMA have potential  cross-reactivity with these compounds and may provide false positive  result.  Performed at [facility], [address], [place], [place] [address]    Magnesium  11/28/2024 2.0  1.7 - 2.4 mg/dL Final   Performed at [facility], [address], [place], [place] [address]  Admission on 06/04/2024, Discharged on 06/04/2024  Component Date Value Ref Range Status   Sodium 06/04/2024 141  135 - 145 mmol/L Final   Potassium 06/04/2024 3.3 (L)  3.5 - 5.1 mmol/L Final   Chloride 06/04/2024 99  98 - 111 mmol/L Final   CO2 06/04/2024 29  22 - 32 mmol/L  Final   Glucose, Bld 06/04/2024 97  70 - 99 mg/dL Final   Glucose reference range applies only to samples taken after fasting for at least 8 hours.   BUN 06/04/2024 6  6 - 20 mg/dL Final   Creatinine, Ser 06/04/2024 0.61  0.44 - 1.00 mg/dL Final   Calcium 91/92/7974 8.4 (L)  8.9 - 10.3 mg/dL Final   Total Protein 91/92/7974 7.6  6.5 - 8.1 g/dL Final   Albumin 91/92/7974 3.6  3.5 - 5.0 g/dL Final   AST 91/92/7974 248 (H)  15 - 41 U/L Final   ALT 06/04/2024 43  0 - 44 U/L Final   Alkaline Phosphatase 06/04/2024 89  38 - 126 U/L Final   Total Bilirubin 06/04/2024 0.8  0.0 - 1.2 mg/dL Final   GFR, Estimated 06/04/2024 >60  >60 mL/min Final   Comment: (NOTE) Calculated using the CKD-EPI Creatinine Equation (2021)    Anion gap 06/04/2024 13  5 - 15 Final   Performed at [facility], [address], [place], [place] [address]   Alcohol, Ethyl (B) 06/04/2024 497 (HH)  <15 mg/dL Final   Comment: CRITICAL RESULT CALLED TO, READ BACK BY AND VERIFIED WITH FOX,B. RN AT 1440 06/04/24 MULLINS,T (NOTE) For medical purposes only. Performed at [facility], [address] [place], [place] [address]    WBC 06/04/2024 4.6  4.0 - 10.5 K/uL Final   RBC 06/04/2024 3.37 (L)  3.87 - 5.11 MIL/uL Final   Hemoglobin 06/04/2024 12.0  12.0 - 15.0 g/dL Final   HCT 91/92/7974 36.7  36.0 - 46.0 % Final   MCV 06/04/2024 108.9 (H)  80.0 - 100.0 fL Final   MCH 06/04/2024 35.6 (H)  26.0 - 34.0 pg Final   MCHC 06/04/2024 32.7  30.0 - 36.0 g/dL Final   RDW 91/92/7974 13.2  11.5 - 15.5 % Final   Platelets 06/04/2024 104 (L)  150 - 400 K/uL Final   nRBC 06/04/2024 0.0  0.0 - 0.2 % Final   Neutrophils Relative % 06/04/2024 46  % Final   Neutro Abs 06/04/2024 2.1  1.7 - 7.7 K/uL Final   Lymphocytes Relative 06/04/2024 40  % Final   Lymphs Abs 06/04/2024 1.8  0.7 - 4.0 K/uL Final   Monocytes Relative 06/04/2024 9  % Final   Monocytes Absolute 06/04/2024 0.4  0.1 - 1.0 K/uL Final    Eosinophils Relative 06/04/2024 2  % Final   Eosinophils Absolute 06/04/2024 0.1  0.0 - 0.5 K/uL Final   Basophils Relative 06/04/2024 2  % Final   Basophils Absolute 06/04/2024 0.1  0.0 - 0.1 K/uL Final   Immature Granulocytes 06/04/2024 1  % Final   Abs Immature Granulocytes 06/04/2024 0.06  0.00 - 0.07 K/uL Final   Performed at [facility], [address], [place], [place] [address]   Prothrombin Time 06/04/2024 13.2  11.4 - 15.2 seconds Final   INR 06/04/2024 0.9  0.8 -  1.2 Final   Comment: (NOTE) INR goal varies based on device and disease states. Performed at [facility], [address], [place], [place] [address]    Preg, Serum 06/04/2024 NEGATIVE  NEGATIVE Final   Comment:        THE SENSITIVITY OF THIS METHODOLOGY IS >10 mIU/mL. Performed at [facility], [address] [place], [place] [address]    Color, Urine 06/04/2024 YELLOW  YELLOW Final   APPearance 06/04/2024 CLEAR  CLEAR Final   Specific Gravity, Urine 06/04/2024 1.008  1.005 - 1.030 Final   pH 06/04/2024 6.0  5.0 - 8.0 Final   Glucose, UA 06/04/2024 NEGATIVE  NEGATIVE mg/dL Final   Hgb urine dipstick 06/04/2024 NEGATIVE  NEGATIVE Final   Bilirubin Urine 06/04/2024 NEGATIVE  NEGATIVE Final   Ketones, ur 06/04/2024 NEGATIVE  NEGATIVE mg/dL Final   Protein, ur 91/92/7974 NEGATIVE  NEGATIVE mg/dL Final   Nitrite 91/92/7974 POSITIVE (A)  NEGATIVE Final   Leukocytes,Ua 06/04/2024 TRACE (A)  NEGATIVE Final   RBC / HPF 06/04/2024 0-5  0 - 5 RBC/hpf Final   WBC, UA 06/04/2024 0-5  0 - 5 WBC/hpf Final   Bacteria, UA 06/04/2024 RARE (A)  NONE SEEN Final   Squamous Epithelial / HPF 06/04/2024 0-5  0 - 5 /HPF Final   Mucus 06/04/2024 PRESENT   Final   Amorphous Crystal 06/04/2024 PRESENT   Final   Performed at [facility], [address], [place], [place] [address]   Opiates 06/04/2024 NONE DETECTED  NONE DETECTED Final   Cocaine 06/04/2024  NONE DETECTED  NONE DETECTED Final   Benzodiazepines 06/04/2024 NONE DETECTED  NONE DETECTED Final   Amphetamines 06/04/2024 NONE DETECTED  NONE DETECTED Final   Tetrahydrocannabinol 06/04/2024 NONE DETECTED  NONE DETECTED Final   Barbiturates 06/04/2024 NONE DETECTED  NONE DETECTED Final   Comment: (NOTE) DRUG SCREEN FOR MEDICAL PURPOSES ONLY.  IF CONFIRMATION IS NEEDED FOR ANY PURPOSE, NOTIFY LAB WITHIN 5 DAYS.  LOWEST DETECTABLE LIMITS FOR URINE DRUG SCREEN Drug Class                     Cutoff (ng/mL) Amphetamine and metabolites    1000 Barbiturate and metabolites    200 Benzodiazepine                 200 Opiates and metabolites        300 Cocaine and metabolites        300 THC                            50 Performed at [facility], [address] [place], [place] [address]     Blood Alcohol level:  Lab Results  Component Value Date   ETH 96 (H) 11/28/2024   ETH 497 (HH) 06/04/2024    Metabolic Disorder Labs: No results found for: HGBA1C, MPG No results found for: PROLACTIN No results found for: CHOL, TRIG, HDL, CHOLHDL, VLDL, LDLCALC  Therapeutic Lab Levels: No results found for: LITHIUM No results found for: VALPROATE No results found for: CBMZ  Physical Findings   AIMS    Flowsheet Row [facility]  AIMS Total Score 0   AUDIT    Flowsheet Row Ad[facility] ([facility]  Alcohol Use Disorder Identification Test Final Score (AUDIT) 18  PHQ2-9    Flowsheet Row ED from 11/29/2024 in [facility]  PHQ-2 Total Score 2  PHQ-9 Total Score 11   Flowsheet Row ED from 11/29/2024 in [facility] ED from 11/27/2024 in [facility] Emergency Department at [facility] ED from 06/04/2024 in [facility] Department at [facility]  C-SSRS RISK CATEGORY No Risk No Risk No Risk     Musculoskeletal  Strength & Muscle Tone: within normal limits Gait & Station: normal Patient leans: N/A  Psychiatric Specialty Exam  Presentation  General Appearance:  Appropriate for Environment  Eye Contact: Good  Speech: Clear and Coherent; Normal Rate  Speech Volume: Normal  Handedness: Right   Mood and Affect  Mood: Euthymic  Affect: Appropriate   Thought Process  Thought Processes: Coherent; Goal Directed  Descriptions of Associations:Intact  Orientation:Full (Time, Place and Person)  Thought Content:Logical     Hallucinations:Hallucinations: None  Ideas of Reference:None  Suicidal Thoughts:Suicidal Thoughts: No  Homicidal Thoughts:Homicidal Thoughts: No   Sensorium  Memory: Immediate Good  Judgment: Fair  Insight: Fair   Art Therapist  Concentration: Fair  Attention Span: Good  Recall: Good  Fund of Knowledge: Good  Language: Good   Psychomotor Activity  Psychomotor Activity: Psychomotor Activity: Normal   Assets  Assets: Desire for Improvement   Sleep  Sleep: Sleep: Fair  Estimated Sleeping Duration (Last 24 Hours): 8.50-10.00 hours  Nutritional Assessment (For OBS and [facility] admissions only) Has the patient had a weight loss or gain of 10 pounds or more in the last 3 months?: No Has the patient had a decrease in food intake/or appetite?: No Does the patient have dental problems?: No Does the patient have eating habits or behaviors that may be indicators of an eating disorder including binging or inducing vomiting?: No Has the patient recently lost weight without trying?: 0 Has the patient been eating poorly because of a decreased appetite?: 0 Malnutrition Screening Tool Score: 0    Physical Exam  Physical Exam Vitals and nursing note reviewed.  Constitutional:      Appearance: Normal appearance.  HENT:     Head: Normocephalic.      Nose: Nose normal.  Cardiovascular:     Rate and Rhythm: Tachycardia present.  Pulmonary:     Effort: Pulmonary effort is normal.  Musculoskeletal:     Cervical back: Normal range of motion.  Neurological:     General: No focal deficit present.     Mental Status: She is alert and oriented to person, place, and time.    Review of Systems  Constitutional: Negative.   HENT: Negative.    Eyes: Negative.   Respiratory: Negative.    Cardiovascular: Negative.   Gastrointestinal: Negative.   Genitourinary: Negative.   Musculoskeletal:  Positive for joint pain.  Neurological: Negative.   Endo/Heme/Allergies: Negative.   Psychiatric/Behavioral:  Positive for substance abuse. The patient is nervous/anxious.    Blood pressure 105/74, pulse (!) 108, temperature 97.9 F (36.6 C), resp. rate 17, SpO2 100%. There is no height or weight on file to calculate BMI.  Treatment Plan Summary: Daily contact with patient to assess and evaluate symptoms and progress in treatment Will maintain Q 15 minutes observation for safety.  Estimated LOS:  5-7 days Reviewed admission lab: AST elevated at 89, ALT within normal limits at 24, UDS positive for barbiturates and benzodiazepines as administered in the ED, vitamin D  low 13.4, labs consistent with anemia.  Initial BAL  96.  EKG displays ongoing QT prolongation 396/487.  Patient has already been medically cleared. Patient will participate in  group programming Medication management:    Continue phenobarbital  32.4 mg taper every 8 hours.  Last dose tomorrow morning.    Continue vitamin D  50,000 units weekly   Continue thiamine  100 mg daily   Start sertraline  25 mg daily, first dose tomorrow Other PRNS  Continue Tylenol  650 mg every 6 hours PRN for mild pain Continue Maalox 30 mg every 4 hrs PRN for indigestion Continue Milk of Magnesia as needed every 6 hrs for constipation Continue ibuprofen  600 mg every 8 hours as needed for pain Continue as needed  agitation protocol with Zyprexa  6.  Continue CIWA assessments for alcohol withdrawal symptom monitoring.  Patient does have a history of complicated withdrawal, delirium tremens and withdrawal seizures.  Discharge Planning: Social work and case management to assist with discharge planning and identification of hospital follow-up needs prior to discharge Estimated LOS: 5-7days Discharge Concerns: Need to establish a safety plan; Medication compliance and effectiveness Discharge Goals: Residential substance abuse treatment Awaiting acceptance at [facility]     Alan Thresa PIETY   12/03/2024

## 2024-12-03 NOTE — Group Note (Signed)
 Group Topic: Wellness  Group Date: 12/03/2024 Start Time: 1200 End Time: 1230 Facilitators: Daved Tinnie HERO, RN  Department: [facility]  Number of Participants: 5  Group Focus: nursing group Treatment Modality:  Psychoeducation Interventions utilized were patient education Purpose: increase insight  Name: Miranda Wood Date of Birth: [birth year]  MR: [id number]    Level of Participation: moderate Quality of Participation: cooperative Interactions with others: gave feedback Mood/Affect: appropriate Triggers (if applicable): n/a Cognition: coherent/clear Progress: Gaining insight Response: medications reviewed with pt, further questions denied.  Plan: patient will be encouraged to attend future RN education groups   Patients Problems:  Patient Active Problem List   Diagnosis Date Noted   Alcohol abuse 11/29/2024   Alcohol use disorder, moderate, dependence (HCC) 11/28/2024   Neuropathy 01/13/2024   Hyponatremia 05/14/2021   Alcohol withdrawal (HCC) 05/11/2021   AKI (acute kidney injury) 05/11/2021   Hypokalemia 05/11/2021   Jaundice 05/11/2021   Prolonged QT interval 05/11/2021   Acute hyperactive alcohol withdrawal delirium (HCC) 12/31/2020   Hypomagnesemia 12/31/2020   Hyperbilirubinemia 12/31/2020   Infected sebaceous cyst of back 12/31/2020   Pyuria 12/31/2020   Seizure due to alcohol withdrawal (HCC) 12/30/2020   Nausea and vomiting 12/30/2020   Tobacco use 12/30/2020   Thrombocytopenia 12/30/2020   Seizure (HCC) 12/30/2020   Alcohol dependence with withdrawal with complication (HCC) 04/29/2018   Major depressive disorder, 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

## 2024-12-03 NOTE — ED Notes (Signed)
 Pt presents as calm and pleasant. Pt c/o L shoulder pain, prn tylenol  administered. Pt denies si hi avh, verbal contract for safety provided. Medications reviewed, questions denied. Pt currently have lunch in dayroom, no signs of distress observed.

## 2024-12-03 NOTE — ED Notes (Signed)
Patient sleeping with no s/s of distress.

## 2024-12-03 NOTE — ED Notes (Signed)
 Pt currently occupying self in dayroom with activity. No signs of distress observed. Pt ate dinner.

## 2024-12-03 NOTE — Group Note (Signed)
 Group Topic: Communication  Group Date: 12/03/2024 Start Time: 1330 End Time: 1410 Facilitators: Laneta Renea POUR, NT  Department: [facility]  Number of Participants: 3  Group Focus: coping skills and dual diagnosis Treatment Modality:  Psychoeducation Interventions utilized were problem solving Purpose: enhance coping skills and relapse prevention strategies  Name: Miranda Wood Date of Birth: November 07, 1975  MR: [id number]    Level of Participation: active Quality of Participation: cooperative and engaged Interactions with others: gave feedback Mood/Affect: appropriate and bright Triggers (if applicable): None Cognition: coherent/clear and insightful Progress: Gaining insight Response:  I like things organized which helps with problem solving.    Plan: patient will be encouraged to attend group.   Patients Problems:  Patient Active Problem List   Diagnosis Date Noted   Alcohol abuse 11/29/2024   Alcohol use disorder, moderate, dependence (HCC) 11/28/2024   Neuropathy 01/13/2024   Hyponatremia 05/14/2021   Alcohol withdrawal (HCC) 05/11/2021   AKI (acute kidney injury) 05/11/2021   Hypokalemia 05/11/2021   Jaundice 05/11/2021   Prolonged QT interval 05/11/2021   Acute hyperactive alcohol withdrawal delirium (HCC) 12/31/2020   Hypomagnesemia 12/31/2020   Hyperbilirubinemia 12/31/2020   Infected sebaceous cyst of back 12/31/2020   Pyuria 12/31/2020   Seizure due to alcohol withdrawal (HCC) 12/30/2020   Nausea and vomiting 12/30/2020   Tobacco use 12/30/2020   Thrombocytopenia 12/30/2020   Seizure (HCC) 12/30/2020   Alcohol dependence with withdrawal with complication (HCC) 04/29/2018   Major depressive disorder, 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

## 2024-12-03 NOTE — Care Management (Signed)
 [facility] Care Management...  Writer reached out to [facility] to confirm receipt of updated medication list and possible acceptance of patient to their program.   Writer left HIPAA compliant message for return call    Writer will follow up

## 2024-12-03 NOTE — Care Management (Signed)
 [facility] Care Mangement...  Writer called [facility] for an update.  Receptionist reports she's unable to view Miranda Wood's email but reports she was in the office today and will return tomorrow.  Writer requested for the receptionist to pass along a message to [place] to inform her the medications were emailed again today but [facility] has not heard back regarding confirmation and a time to discharge.    Writer will follow up again with [facility] tomorrow morning.

## 2024-12-04 MED ORDER — ACETAMINOPHEN 325 MG PO TABS: 650.0000 mg | ORAL_TABLET | Freq: Four times a day (QID) | ORAL | 0 refills | Status: AC | PRN

## 2024-12-04 MED ORDER — HYDROXYZINE HCL 25 MG PO TABS: 25.0000 mg | ORAL_TABLET | Freq: Two times a day (BID) | ORAL | 0 refills | Status: AC | PRN

## 2024-12-04 MED ORDER — IBUPROFEN 800 MG PO TABS: 800.0000 mg | ORAL_TABLET | Freq: Three times a day (TID) | ORAL | 0 refills | Status: AC | PRN

## 2024-12-04 MED ORDER — VITAMIN B-1 100 MG PO TABS: 100.0000 mg | ORAL_TABLET | Freq: Every day | ORAL | 0 refills | Status: AC

## 2024-12-04 MED ORDER — SERTRALINE HCL 25 MG PO TABS: 25.0000 mg | ORAL_TABLET | Freq: Every day | ORAL | 0 refills | Status: AC

## 2024-12-04 MED ORDER — IBUPROFEN 400 MG PO TABS
800.0000 mg | ORAL_TABLET | Freq: Three times a day (TID) | ORAL | Status: AC | PRN
  Administered 2024-12-04 (×2): 800 mg via ORAL
  Filled 2024-12-04: qty 2

## 2024-12-04 MED ORDER — VITAMIN D (ERGOCALCIFEROL) 1.25 MG (50000 UNIT) PO CAPS: 50000.0000 [IU] | ORAL_CAPSULE | ORAL | 0 refills | Status: AC

## 2024-12-04 NOTE — Group Note (Signed)
 Group Topic: Recovery Basics  Group Date: 12/04/2024 Start Time: 1400 End Time: 1430 Facilitators: Belle Camellia SAILOR, RN  Department: [facility]  Number of Participants: 4  Group Focus: chemical dependency education and relapse prevention Treatment Modality:  Cognitive Behavioral Therapy Interventions utilized were patient education Purpose: express feelings, increase insight, relapse prevention strategies, and trigger / craving management  Name: Miranda Wood Date of Birth: [birth year]  MR: [id number]     Level of Participation: moderate Quality of Participation: supportive Interactions with others: gave feedback Mood/Affect: appropriate  Cognition: no insight Progress: Minimal Plan: follow-up needed  Patients Problems:  Patient Active Problem List   Diagnosis Date Noted   Alcohol abuse 11/29/2024   Alcohol use disorder, moderate, dependence (HCC) 11/28/2024   Neuropathy 01/13/2024   Hyponatremia 05/14/2021   Alcohol withdrawal (HCC) 05/11/2021   AKI (acute kidney injury) 05/11/2021   Hypokalemia 05/11/2021   Jaundice 05/11/2021   Prolonged QT interval 05/11/2021   Acute hyperactive alcohol withdrawal delirium (HCC) 12/31/2020   Hypomagnesemia 12/31/2020   Hyperbilirubinemia 12/31/2020   Infected sebaceous cyst of back 12/31/2020   Pyuria 12/31/2020   Seizure due to alcohol withdrawal (HCC) 12/30/2020   Nausea and vomiting 12/30/2020   Tobacco use 12/30/2020   Thrombocytopenia 12/30/2020   Seizure (HCC) 12/30/2020   Alcohol dependence with withdrawal with complication (HCC) 04/29/2018   Major depressive disorder, 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

## 2024-12-04 NOTE — ED Notes (Signed)
 Pt received with a pleasant demeanor, calm and cooperative without complaints. Maintained on CIWA protocol.  Safety maintained.

## 2024-12-04 NOTE — Group Note (Signed)
 Group Topic: Healthy Self Image and Positive Change  Group Date: 12/04/2024 Start Time: 1100 End Time: 1200 Facilitators: Tysheka Fanguy, LCSW  Department: [facility]  Number of Participants: 1  Group Focus: abuse issues, chemical dependency issues, coping skills, and substance abuse education Treatment Modality:  Cognitive Behavioral Therapy Interventions utilized were group exercise and support Purpose: enhance coping skills, express feelings, express irrational fears, regain self-worth, and trigger / craving management  Writer utilized a CBT activity called  Wearing the Mask.  The client was given two blank faces.  On one face the client is to depict how the client views herself vs how the world views herself.  Client discussed with the Writer how her childhood impacted her decision making skills and substance use.  Client explored directly with the client her self worth and how it aids in her negative decision making skills in her relationship.  Writer only had one group participant . Name: Miranda Wood Date of Birth: Mar 19, 1976  MR: [id number]    Level of Participation: active Quality of Participation: attentive, cooperative, engaged, initiates communication, motivated, and offered feedback Interactions with others: gave feedback Mood/Affect: anxious, appropriate, brightens with interaction, and positive Triggers (if applicable): relationship, lonely, negative relationship Cognition: coherent/clear, goal directed, and logical Progress: Significant Response: Client was very open and engaging.  Client is anxious but optimistic about her life in 2026.  Plan: follow-up as needed  Patients Problems:  Patient Active Problem List   Diagnosis Date Noted   Alcohol abuse 11/29/2024   Alcohol use disorder, moderate, dependence (HCC) 11/28/2024   Neuropathy 01/13/2024   Hyponatremia 05/14/2021   Alcohol withdrawal (HCC) 05/11/2021   AKI (acute  kidney injury) 05/11/2021   Hypokalemia 05/11/2021   Jaundice 05/11/2021   Prolonged QT interval 05/11/2021   Acute hyperactive alcohol withdrawal delirium (HCC) 12/31/2020   Hypomagnesemia 12/31/2020   Hyperbilirubinemia 12/31/2020   Infected sebaceous cyst of back 12/31/2020   Pyuria 12/31/2020   Seizure due to alcohol withdrawal (HCC) 12/30/2020   Nausea and vomiting 12/30/2020   Tobacco use 12/30/2020   Thrombocytopenia 12/30/2020   Seizure (HCC) 12/30/2020   Alcohol dependence with withdrawal with complication (HCC) 04/29/2018   Major depressive disorder, 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   Substance induced mood disorder (HCC)    Substance-induced anxiety disorder (HCC)    Alcohol withdrawal without perceptual disturbances with complication [facility])    Cesarean delivery delivered 07/10/2011

## 2024-12-04 NOTE — Discharge Instructions (Addendum)
 [facility] Care Management...  Patient requested residential placement  Per [place] at [facility], patient has been accepted to their program  Patient will discharge Saturday 12/05/2024 by 11:00 am  [facility] [address] [place], [place] [address] Phone: [phone]  Driver go around the loop and first door on the left  Transportation has been arranged, Safe transport will pick up between 10 - 10:30 am  7 day samples and 30 day scripts

## 2024-12-04 NOTE — ED Notes (Signed)
 Patient is currently resting with eyes closed. No acute distress noted. Respirations present, even and unlabored. No issues present will continue with safety checks every 15 minutes per facility protocol.

## 2024-12-04 NOTE — ED Notes (Addendum)
" ° ° °  °    12/04/24 0900  Psych Admission Type (Psych Patients Only)  Admission Status Voluntary  Psychosocial Assessment  Patient Complaints Substance abuse  Eye Contact Fair  Facial Expression Animated  Affect Appropriate to circumstance  Speech Logical/coherent  Interaction Assertive  Motor Activity Other (Comment) (wnl)  Appearance/Hygiene Unremarkable  Behavior Characteristics Cooperative  Mood Pleasant  Thought Process  Coherency WDL  Content WDL  Delusions WDL  Perception WDL  Hallucination None reported or observed  Judgment WDL  Confusion WDL  Danger to Self  Current suicidal ideation? Denies  Description of Suicide Plan denies  Agreement Not to Harm Self Yes  Description of Agreement verbal  Danger to Others  Danger to Others None reported or observed    Pt visible in milieu this morning.  Awake and alert.  Ate breakfast and took medications without prompting.      "
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
## 2024-12-04 NOTE — Group Note (Addendum)
 Group Topic: Understanding Self  Group Date: 12/03/2024 Start Time: 1000 End Time: 1050 Facilitators: Gerome Jolly, NT  Department: [facility]  Number of Participants: 6  Group Focus: acceptance, chemical dependency education, feeling awareness/expression, forgiveness, healthy friendships, personal responsibility, relapse prevention, self-awareness, and substance abuse education Treatment Modality:  Cognitive Behavioral Therapy, Psychoeducation, and Solution-Focused Therapy Interventions utilized were clarification, exploration, problem solving, story telling, and support Purpose: Facilitator engaged the group in a open discussion about Life's Time Line. This was done to possibly identify and/or find answers to the question What Happened?  By exploring maladaptive thinking, expressing feelings, regaining self-worth, and developing relapse prevention strategies  Name: Miranda Wood Date of Birth: [birth year]  MR: [id number]    Level of Participation: active Quality of Participation: attentive, cooperative, and engaged Interactions with others: gave feedback Mood/Affect: appropriate and brightens with interaction Triggers (if applicable): na Cognition: coherent/clear and manipulative Progress: Moderate Response: Patient reported changing a lot of things would require learning about herself, to identify her personality without the drugs. Patient stated she is ready to do something different     Plan: follow-up needed  Patients Problems:  Patient Active Problem List   Diagnosis Date Noted   Alcohol abuse 11/29/2024   Alcohol use disorder, moderate, dependence (HCC) 11/28/2024   Neuropathy 01/13/2024   Hyponatremia 05/14/2021   Alcohol withdrawal (HCC) 05/11/2021   AKI (acute kidney injury) 05/11/2021   Hypokalemia 05/11/2021   Jaundice 05/11/2021   Prolonged QT interval 05/11/2021   Acute hyperactive alcohol withdrawal delirium (HCC)  12/31/2020   Hypomagnesemia 12/31/2020   Hyperbilirubinemia 12/31/2020   Infected sebaceous cyst of back 12/31/2020   Pyuria 12/31/2020   Seizure due to alcohol withdrawal (HCC) 12/30/2020   Nausea and vomiting 12/30/2020   Tobacco use 12/30/2020   Thrombocytopenia 12/30/2020   Seizure (HCC) 12/30/2020   Alcohol dependence with withdrawal with complication (HCC) 04/29/2018   Major depressive disorder, 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   Substance induced mood disorder (HCC)    Substance-induced anxiety disorder (HCC)    Alcohol withdrawal without perceptual disturbances with complication [facility])    Cesarean delivery delivered 07/10/2011

## 2024-12-04 NOTE — Group Note (Signed)
 Group Topic: Change and Accountability  Group Date: 12/04/2024 Start Time: 2030 End Time: 2100 Facilitators: Bryan Saddie CROME, [place]  Department: [facility]  Number of Participants: 4  Group Focus: goals/reality orientation Treatment Modality:  Psychoeducation Interventions utilized were assignment and clarification Purpose: increase insight and reinforce self-care  Name: Miranda Wood Date of Birth: 1975-12-01  MR: [id number]    Level of Participation: active Quality of Participation: attentive, cooperative, and engaged Interactions with others: gave feedback Mood/Affect: appropriate and positive Triggers (if applicable): N/A Cognition: coherent/clear and insightful Progress: Moderate Response: N/A Plan: follow-up needed  Patients Problems:  Patient Active Problem List   Diagnosis Date Noted   Alcohol abuse 11/29/2024   Alcohol use disorder, moderate, dependence (HCC) 11/28/2024   Neuropathy 01/13/2024   Hyponatremia 05/14/2021   Alcohol withdrawal (HCC) 05/11/2021   AKI (acute kidney injury) 05/11/2021   Hypokalemia 05/11/2021   Jaundice 05/11/2021   Prolonged QT interval 05/11/2021   Acute hyperactive alcohol withdrawal delirium (HCC) 12/31/2020   Hypomagnesemia 12/31/2020   Hyperbilirubinemia 12/31/2020   Infected sebaceous cyst of back 12/31/2020   Pyuria 12/31/2020   Seizure due to alcohol withdrawal (HCC) 12/30/2020   Nausea and vomiting 12/30/2020   Tobacco use 12/30/2020   Thrombocytopenia 12/30/2020   Seizure (HCC) 12/30/2020   Alcohol dependence with withdrawal with complication (HCC) 04/29/2018   Major depressive disorder, 

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
## 2024-12-04 NOTE — ED Notes (Signed)
 Patient sleeping with no s/s of distress. No inappropriate behaviors observed or reported at this time. Environment secured. Safety checks in place per facility protocol.

## 2024-12-04 NOTE — ED Notes (Signed)
 Pt received alert and oriented, seated in the dayroom with peers, interacting with others appropriately. Denies feeling and thoughts to self harm or AVH.  Denies distress or discomfort.  Safety maintained.

## 2024-12-04 NOTE — [facility] Group Notes (Signed)
 Spiritual Care and Counseling Group Note  12/04/2024 2:45pm  Facilitated by: Librada Donnice Lin   Type of Therapy and Topic:  Hope    Participation Level:  Active  Description of Group:  Group focused on topic of hope.  Patients participated in facilitated discussion around topic, connecting with one another around experiences and definitions for hope.  Group members engaged with group word cloud.  Members selected an image of what hope looks like for them today.  Group engaged in discussion around how their definitions of hope are present today in hospital.    Summary of Patient Progress:  Present throughout group.  Active in discussion.  Identified determination as a characteristic of hope she wants to hold onto for the next 24 hours.     Therapeutic Modalities: Psycho-social ed, Adlerian, Narrative, MI   Librada Donnice Lin, Chaplain 12/04/2024 4:50 PM

## 2024-12-04 NOTE — Group Note (Signed)
 Group Topic: Balance in Life  Group Date: 12/04/2024 Start Time: 1230 End Time: 1250 Facilitators: Deidre Prentis CROME, NT  Department: [facility]  Number of Participants: 4  Group Focus: affirmation Treatment Modality:  Psychoeducation Interventions utilized were support Purpose: reinforce self-care  Name: Miranda Wood Date of Birth: 05-06-1976  MR: [id number]    Level of Participation: moderate Quality of Participation: attentive Interactions with others: gave feedback Mood/Affect: brightens with interaction Triggers (if applicable): N/A Cognition: coherent/clear Progress: Moderate Response:  Pt was engaged and responded appropriately to psycho education. Plan: follow-up needed  Patients Problems:  Patient Active Problem List   Diagnosis Date Noted   Alcohol abuse 11/29/2024   Alcohol use disorder, moderate, dependence (HCC) 11/28/2024   Neuropathy 01/13/2024   Hyponatremia 05/14/2021   Alcohol withdrawal (HCC) 05/11/2021   AKI (acute kidney injury) 05/11/2021   Hypokalemia 05/11/2021   Jaundice 05/11/2021   Prolonged QT interval 05/11/2021   Acute hyperactive alcohol withdrawal delirium (HCC) 12/31/2020   Hypomagnesemia 12/31/2020   Hyperbilirubinemia 12/31/2020   Infected sebaceous cyst of back 12/31/2020   Pyuria 12/31/2020   Seizure due to alcohol withdrawal (HCC) 12/30/2020   Nausea and vomiting 12/30/2020   Tobacco use 12/30/2020   Thrombocytopenia 12/30/2020   Seizure (HCC) 12/30/2020   Alcohol dependence with withdrawal with complication (HCC) 04/29/2018   Major depressive disorder, 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

## 2024-12-04 NOTE — Care Management (Addendum)
 [facility] Care Management...  Per Inocente, she received my email with updated Medications list attached. She will forward to supervisor and return call with update  Writer reached out to Willard at [facility] to confirm receipt of updated med Programmer, Multimedia emailed Inocente at [employer] [email or web address]

## 2024-12-04 NOTE — Care Management (Addendum)
 [facility] Care Management...  Patient requested residential placement  Per Inocente at [facility], patient has been accepted to their program  [facility] [address] [place], [place] [address] Phone: [phone]  Transportation has been arranged...   Safe transport will pick up between 10 - 10:30 am Saturday 12/05/2024  7 day samples and 30 day scripts
